# Patient Record
Sex: Male | Born: 1985 | Race: Black or African American | Hispanic: No | Marital: Single | Smoking: Never smoker
Health system: Southern US, Community
[De-identification: ages and names within clinical notes are randomized; demographics above are authoritative.]

## PROBLEM LIST (undated history)

## (undated) DIAGNOSIS — R42 Dizziness and giddiness: Secondary | ICD-10-CM

## (undated) DIAGNOSIS — I619 Nontraumatic intracerebral hemorrhage, unspecified: Secondary | ICD-10-CM

## (undated) DIAGNOSIS — J302 Other seasonal allergic rhinitis: Secondary | ICD-10-CM

## (undated) DIAGNOSIS — K8020 Calculus of gallbladder without cholecystitis without obstruction: Secondary | ICD-10-CM

## (undated) DIAGNOSIS — I639 Cerebral infarction, unspecified: Secondary | ICD-10-CM

## (undated) DIAGNOSIS — I10 Essential (primary) hypertension: Secondary | ICD-10-CM

## (undated) DIAGNOSIS — R9430 Abnormal result of cardiovascular function study, unspecified: Secondary | ICD-10-CM

## (undated) HISTORY — DX: Dizziness and giddiness: R42

## (undated) HISTORY — PX: NO PAST SURGERIES: SHX2092

## (undated) HISTORY — DX: Abnormal result of cardiovascular function study, unspecified: R94.30

## (undated) HISTORY — DX: Calculus of gallbladder without cholecystitis without obstruction: K80.20

---
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
## 2009-12-04 ENCOUNTER — Emergency Department (HOSPITAL_COMMUNITY): Admission: EM | Admit: 2009-12-04 | Discharge: 2009-12-05 | Payer: Self-pay | Admitting: Emergency Medicine

## 2010-12-20 LAB — POCT I-STAT, CHEM 8
BUN: 11 mg/dL (ref 6–23)
Calcium, Ion: 1.12 mmol/L (ref 1.12–1.32)
Chloride: 102 mEq/L (ref 96–112)
Creatinine, Ser: 1.2 mg/dL (ref 0.4–1.5)
Glucose, Bld: 137 mg/dL — ABNORMAL HIGH (ref 70–99)
HCT: 49 % (ref 39.0–52.0)
Hemoglobin: 16.7 g/dL (ref 13.0–17.0)
Potassium: 3.3 mEq/L — ABNORMAL LOW (ref 3.5–5.1)
Sodium: 142 mEq/L (ref 135–145)
TCO2: 29 mmol/L (ref 0–100)

## 2013-06-26 ENCOUNTER — Inpatient Hospital Stay (HOSPITAL_COMMUNITY)
Admission: EM | Admit: 2013-06-26 | Discharge: 2013-07-01 | DRG: 810 | Disposition: A | Discharge status: Rehabilitation Facility | Payer: BC Managed Care – PPO | Attending: Neurology | Admitting: Neurology

## 2013-06-26 ENCOUNTER — Inpatient Hospital Stay (HOSPITAL_COMMUNITY): Payer: BC Managed Care – PPO

## 2013-06-26 ENCOUNTER — Encounter (HOSPITAL_COMMUNITY): Payer: Self-pay | Admitting: Radiology

## 2013-06-26 ENCOUNTER — Emergency Department (HOSPITAL_COMMUNITY): Payer: BC Managed Care – PPO

## 2013-06-26 DIAGNOSIS — I619 Nontraumatic intracerebral hemorrhage, unspecified: Secondary | ICD-10-CM | POA: Insufficient documentation

## 2013-06-26 DIAGNOSIS — R471 Dysarthria and anarthria: Secondary | ICD-10-CM | POA: Diagnosis present

## 2013-06-26 DIAGNOSIS — I10 Essential (primary) hypertension: Secondary | ICD-10-CM

## 2013-06-26 DIAGNOSIS — R51 Headache: Secondary | ICD-10-CM | POA: Diagnosis present

## 2013-06-26 DIAGNOSIS — E860 Dehydration: Secondary | ICD-10-CM | POA: Diagnosis present

## 2013-06-26 DIAGNOSIS — G936 Cerebral edema: Secondary | ICD-10-CM

## 2013-06-26 DIAGNOSIS — I639 Cerebral infarction, unspecified: Secondary | ICD-10-CM

## 2013-06-26 DIAGNOSIS — R110 Nausea: Secondary | ICD-10-CM | POA: Diagnosis present

## 2013-06-26 DIAGNOSIS — G8190 Hemiplegia, unspecified affecting unspecified side: Secondary | ICD-10-CM | POA: Diagnosis present

## 2013-06-26 DIAGNOSIS — E876 Hypokalemia: Secondary | ICD-10-CM | POA: Diagnosis present

## 2013-06-26 DIAGNOSIS — R29810 Facial weakness: Secondary | ICD-10-CM | POA: Diagnosis present

## 2013-06-26 HISTORY — DX: Other seasonal allergic rhinitis: J30.2

## 2013-06-26 HISTORY — DX: Nontraumatic intracerebral hemorrhage, unspecified: I61.9

## 2013-06-26 HISTORY — DX: Essential (primary) hypertension: I10

## 2013-06-26 HISTORY — DX: Cerebral infarction, unspecified: I63.9

## 2013-06-26 LAB — BASIC METABOLIC PANEL
BUN: 14 mg/dL (ref 6–23)
CO2: 25 mEq/L (ref 19–32)
CO2: 22 mEq/L (ref 19–32)
Calcium: 8.7 mg/dL (ref 8.4–10.5)
Chloride: 102 mEq/L (ref 96–112)
Chloride: 103 mEq/L (ref 96–112)
Creatinine, Ser: 1.31 mg/dL (ref 0.50–1.35)
Creatinine, Ser: 1.25 mg/dL (ref 0.50–1.35)
GFR calc Af Amer: 84 mL/min — ABNORMAL LOW (ref >90)
Glucose, Bld: 129 mg/dL — ABNORMAL HIGH (ref 70–99)
Glucose, Bld: 105 mg/dL — ABNORMAL HIGH (ref 70–99)
Potassium: 3 mEq/L — ABNORMAL LOW (ref 3.5–5.1)
Sodium: 138 mEq/L (ref 135–145)

## 2013-06-26 LAB — RAPID URINE DRUG SCREEN, HOSP PERFORMED
Amphetamines: NOT DETECTED
Barbiturates: NOT DETECTED
Cocaine: NOT DETECTED
Opiates: NOT DETECTED
Tetrahydrocannabinol: NOT DETECTED

## 2013-06-26 LAB — COMPREHENSIVE METABOLIC PANEL
ALT: 21 U/L (ref 0–53)
AST: 24 U/L (ref 0–37)
Albumin: 3.6 g/dL (ref 3.5–5.2)
Alkaline Phosphatase: 64 U/L (ref 39–117)
CO2: 23 mEq/L (ref 19–32)
Calcium: 8.4 mg/dL (ref 8.4–10.5)
Chloride: 101 mEq/L (ref 96–112)
Creatinine, Ser: 1.38 mg/dL — ABNORMAL HIGH (ref 0.50–1.35)
GFR calc non Af Amer: 68 mL/min — ABNORMAL LOW (ref >90)
Glucose, Bld: 159 mg/dL — ABNORMAL HIGH (ref 70–99)
Sodium: 139 mEq/L (ref 135–145)

## 2013-06-26 LAB — DIFFERENTIAL
Basophils Absolute: 0.1 10*3/uL (ref 0.0–0.1)
Basophils Relative: 1 % (ref 0–1)
Eosinophils Relative: 2 % (ref 0–5)
Lymphocytes Relative: 46 % (ref 12–46)
Lymphs Abs: 5.5 10*3/uL — ABNORMAL HIGH (ref 0.7–4.0)
Neutro Abs: 5.2 10*3/uL (ref 1.7–7.7)
Neutrophils Relative %: 44 % (ref 43–77)

## 2013-06-26 LAB — HEMOGLOBIN A1C: Mean Plasma Glucose: 100 mg/dL (ref <117)

## 2013-06-26 LAB — APTT: aPTT: 28 seconds (ref 24–37)

## 2013-06-26 LAB — CBC
MCV: 85.8 fL (ref 78.0–100.0)
Platelets: 266 10*3/uL (ref 150–400)
RDW: 12.3 % (ref 11.5–15.5)
WBC: 12 10*3/uL — ABNORMAL HIGH (ref 4.0–10.5)

## 2013-06-26 LAB — POCT I-STAT TROPONIN I: Troponin i, poc: 0.03 ng/mL (ref 0.00–0.08)

## 2013-06-26 LAB — PROTIME-INR
INR: 0.98 (ref 0.00–1.49)
Prothrombin Time: 12.8 seconds (ref 11.6–15.2)

## 2013-06-26 MED ORDER — NICARDIPINE HCL IN NACL 20-0.86 MG/200ML-% IV SOLN
5.0000 mg/h | INTRAVENOUS | Status: DC
  Administered 2013-06-26: 5 mg/h via INTRAVENOUS
  Filled 2013-06-26 (×2): qty 200

## 2013-06-26 MED ORDER — ACETAMINOPHEN 650 MG RE SUPP: 650.0000 mg | RECTAL | Status: DC | PRN

## 2013-06-26 MED ORDER — POTASSIUM CHLORIDE 10 MEQ/100ML IV SOLN
10.0000 meq | INTRAVENOUS | Status: AC
  Administered 2013-06-26 (×2): 10 meq via INTRAVENOUS
  Filled 2013-06-26 (×2): qty 100

## 2013-06-26 MED ORDER — SENNOSIDES-DOCUSATE SODIUM 8.6-50 MG PO TABS
1.0000 | ORAL_TABLET | Freq: Two times a day (BID) | ORAL | Status: DC
  Administered 2013-06-26 – 2013-07-01 (×8): 1 via ORAL
  Filled 2013-06-26 (×12): qty 1

## 2013-06-26 MED ORDER — LABETALOL HCL 5 MG/ML IV SOLN
10.0000 mg | INTRAVENOUS | Status: DC | PRN
  Administered 2013-06-27: 20 mg via INTRAVENOUS
  Administered 2013-06-27: 40 mg via INTRAVENOUS
  Filled 2013-06-26: qty 4
  Filled 2013-06-26: qty 8

## 2013-06-26 MED ORDER — ACETAMINOPHEN 325 MG PO TABS
650.0000 mg | ORAL_TABLET | ORAL | Status: DC | PRN
  Administered 2013-06-26 – 2013-06-28 (×4): 650 mg via ORAL
  Filled 2013-06-26 (×4): qty 2

## 2013-06-26 MED ORDER — ONDANSETRON HCL 4 MG/2ML IJ SOLN
4.0000 mg | Freq: Once | INTRAMUSCULAR | Status: AC
  Administered 2013-06-26: 4 mg via INTRAVENOUS

## 2013-06-26 MED ORDER — LABETALOL HCL 5 MG/ML IV SOLN
INTRAVENOUS | Status: AC
  Administered 2013-06-26: 10 mg
  Filled 2013-06-26: qty 4

## 2013-06-26 MED ORDER — AMLODIPINE BESYLATE 5 MG PO TABS
5.0000 mg | ORAL_TABLET | Freq: Every day | ORAL | Status: DC
  Administered 2013-06-26 – 2013-06-28 (×3): 5 mg via ORAL
  Filled 2013-06-26 (×3): qty 1

## 2013-06-26 MED ORDER — NICARDIPINE HCL IN NACL 20-0.86 MG/200ML-% IV SOLN
5.0000 mg/h | INTRAVENOUS | Status: DC
  Administered 2013-06-26: 2.5 mg/h via INTRAVENOUS
  Filled 2013-06-26: qty 200

## 2013-06-26 MED ORDER — ONDANSETRON HCL 4 MG/2ML IJ SOLN
INTRAMUSCULAR | Status: AC
  Filled 2013-06-26: qty 2

## 2013-06-26 MED ORDER — PANTOPRAZOLE SODIUM 40 MG IV SOLR
40.0000 mg | Freq: Every day | INTRAVENOUS | Status: DC
  Administered 2013-06-26: 40 mg via INTRAVENOUS
  Filled 2013-06-26 (×2): qty 40

## 2013-06-26 NOTE — Progress Notes (Signed)
UR completed.  Jezebelle Ledwell, RN BSN MHA CCM Trauma/Neuro ICU Case Manager [phone]  

## 2013-06-26 NOTE — Evaluation (Deleted)
Clinical/Bedside Swallow Evaluation Patient Details  Name: Paul Reeves MRN: [id number] Date of Birth: 1986-07-18  Today's Date: 06/26/2013 Time: 0950-1009 SLP Time Calculation (min): 19 min  Past Medical History:  Past Medical History  Diagnosis Date  . Hypertension 06/26/2013    new dx   Past Surgical History: History reviewed. No pertinent past surgical history. HPI:  Mr. Delbert Darley is a 27 y.o. male presenting with left hemiparesis, dysarthria, headache, nausea. Imaging confirms a 3.2 x 1.9 cm right basal ganglia/sub insular acute intraparenchymal hematoma with local mass effect, no midline shift. 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    Assessment / Plan / Recommendation Clinical Impression  Pt demonstrates normal oral and oropharyngeal swallow function, subjectively. No overt evidence of aspiration or oral residue. Pt may intiate a regular texture diet and thin liquids. No SLP f/u for dysphagia. Will f/u tomorrow for cognitive linguistic eval tomorrow per orders dated 10/2.     Aspiration Risk  Mild    Diet Recommendation Regular;Thin liquid   Liquid Administration via: Cup;Straw Medication Administration: Whole meds with liquid Supervision: Patient able to self feed Postural Changes and/or Swallow Maneuvers: Seated upright 90 degrees    Other  Recommendations Oral Care Recommendations: Oral care BID   Follow Up Recommendations  None    Frequency and Duration        Pertinent Vitals/Pain NA    SLP Swallow Goals     Swallow Study Prior Functional Status       General HPI: Mr. Shalik Sanfilippo is a 27 y.o. male presenting with left hemiparesis, dysarthria, headache, nausea. Imaging confirms a 3.2 x 1.9 cm right basal ganglia/sub insular acute intraparenchymal hematoma with local mass effect, no midline shift. 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  Type of Study: Bedside swallow evaluation Previous Swallow  Assessment: none Diet Prior to this Study: NPO Temperature Spikes Noted: No Respiratory Status: Room air History of Recent Intubation: No Behavior/Cognition: Alert;Cooperative;Pleasant mood Oral Cavity - Dentition: Adequate natural dentition Self-Feeding Abilities: Able to feed self Patient Positioning: Upright in bed Baseline Vocal Quality: Clear Volitional Cough: Strong Volitional Swallow: Able to elicit    Oral/Motor/Sensory Function Overall Oral Motor/Sensory Function: Appears within functional limits for tasks assessed (possible slight left labial droop at rest) Labial ROM: Within Functional Limits Labial Symmetry: Abnormal symmetry left Labial Strength: Within Functional Limits Labial Sensation: Within Functional Limits Lingual ROM: Within Functional Limits Lingual Symmetry: Within Functional Limits Lingual Strength: Within Functional Limits Lingual Sensation: Within Functional Limits Facial ROM: Within Functional Limits Facial Symmetry: Within Functional Limits Facial Strength: Within Functional Limits Facial Sensation: Within Functional Limits Velum: Within Functional Limits Mandible: Within Functional Limits   Ice Chips     Thin Liquid Thin Liquid: Within functional limits Presentation: Cup;Straw    Nectar Thick Nectar Thick Liquid: Not tested   Honey Thick Honey Thick Liquid: Not tested   Puree Puree: Within functional limits   Solid   GO    Solid: Within functional limits      Harlon Ditty, MA CCC-SLP [phone]  De Jaworski, Riley Nearing 06/26/2013,10:17 AM

## 2013-06-26 NOTE — Code Documentation (Signed)
27 yo wm brought in via [facility] for sudden onset HA, N/V, LT side weakness.  Per pt he got off work and came home.  He went into the bathroom and developed a HA, nausea, & Lt weakness.  His mother called EMS.  On arrival to [facility] pt was flaccid with sensory deficit on Lt side & slight dysarthria.  Cpde stroke called 0127, pt arrival 0138, LKW 0045, EDP exam 0134, stroke team arrival 0131, neurologist arrival 36, pt arrival in CT 0136, phlebotomist arrival 0137, ICU bed requested 0144.  NIH 11.  Pt to be admitted by stroke MD

## 2013-06-26 NOTE — ED Notes (Signed)
Patient arrival in CT

## 2013-06-26 NOTE — ED Notes (Signed)
Stroke team arrival

## 2013-06-26 NOTE — ED Notes (Signed)
ICU bed requested

## 2013-06-26 NOTE — Progress Notes (Addendum)
Stroke Team Progress Note  HISTORY Paul Reeves is an 27 y.o. male without known past medical history, brought to [facility] ED as a code stroke by EMS after being found lying in the bathroom by family.   He was last seen normal at 0045 today. Stated that he collapsed across the bathroom, remained conscious all the time, but couldn't get up from the floor and started calling for help. His mother said that he was struggling trying to move the left side floor.  When EMS arrived to the scene he was alert and awake, unable to move the left side, some speech impairment, and SBP 250.  Initial NIHSS 12.   CT brain reveled a 3.2 x 1.9 cm right basal ganglia/sub insular acute intraparenchymal hematoma with local mass effect, no midline shift, no intraventricular extension.   On arrival he complained of HA and nausea, but denies vertigo, double vision, difficulty swallowing, confusion, or visual disturbances.   Received 20 mg IV labetalol while awaiting for cardene drip   Date last known well: 10/1//14  Time last known well: 12:45am  tPA Given: no, ICH  NIHSS: 12  MRS: 3    Patient was not a TPA candidate secondary to hemorrhage. He was admitted to the neuro ICU for further evaluation and treatment.  SUBJECTIVE Overall he feels his condition is rapidly improving. Denies history of hypertension. Denies drug use.  OBJECTIVE Most recent Vital Signs: Filed Vitals:   06/26/13 0445 06/26/13 0500 06/26/13 0600 06/26/13 0700  BP: 164/91 160/98 150/95 148/95  Pulse: 98 98 101 106  Temp:      TempSrc:      Resp: 18 16 20 18   Height:      Weight:      SpO2: 97% 96% 96% 95%   CBG (last 3)   Recent Labs  06/26/13 0153  GLUCAP 162*    IV Fluid Intake:   . niCARDipine 2.5 mg/hr (06/26/13 0300)    MEDICATIONS  . pantoprazole (PROTONIX) IV  40 mg Intravenous QHS  . senna-docusate  1 tablet Oral BID   PRN:  acetaminophen, acetaminophen, labetalol  Diet:  NPO   Activity:  Bedrest DVT  Prophylaxis:  SCD  CLINICALLY SIGNIFICANT STUDIES Basic Metabolic Panel:  Recent Labs Lab 06/26/13 0140 06/26/13 0540  NA 139 138  K 2.3* 3.0*  CL 101 102  CO2 23 25  GLUCOSE 159* 129*  BUN 16 14  CREATININE 1.38* 1.31  CALCIUM 8.4 8.7   Liver Function Tests:  Recent Labs Lab 06/26/13 0140  AST 24  ALT 21  ALKPHOS 64  BILITOT 0.5  PROT 7.4  ALBUMIN 3.6   CBC:  Recent Labs Lab 06/26/13 0140  WBC 12.0*  NEUTROABS 5.2  HGB 16.3  HCT 44.1  MCV 85.8  PLT 266   Coagulation:  Recent Labs Lab 06/26/13 0140  LABPROT 12.8  INR 0.98   Cardiac Enzymes:  Recent Labs Lab 06/26/13 0140  TROPONINI <0.30   Urinalysis: No results found for this basename: COLORURINE, APPERANCEUR, LABSPEC, PHURINE, GLUCOSEU, HGBUR, BILIRUBINUR, KETONESUR, PROTEINUR, UROBILINOGEN, NITRITE, LEUKOCYTESUR,  in the last 168 hours Lipid Panel No results found for this basename: chol, trig, hdl, cholhdl, vldl, ldlcalc   HgbA1C  No results found for this basename: HGBA1C    Urine Drug Screen:   No results found for this basename: labopia, cocainscrnur, labbenz, amphetmu, thcu, labbarb    Alcohol Level: No results found for this basename: ETH,  in the last 168 hours  Ct Head (  brain) Wo Contrast 06/26/2013   3.2 x 1.9 cm right basal ganglia/sub insular acute intraparenchymal hematoma with local mass effect, no midline shift.    MRI of the brain    MRA of the brain    2D Echocardiogram    Carotid Doppler    CXR    EKG    Therapy Recommendations   Physical Exam   Mental Status:  Alert, awake, oriented x 4, thought content appropriate. Mild dysarthria without evidence of aphasia. Able to follow 3 step commands without difficulty.  Cranial Nerves:  II: Discs flat bilaterally; Visual fields grossly normal, pupils equal, round, reactive to light and accommodation  III,IV, VI: ptosis not present, extra-ocular motions intact bilaterally  V:facial light touch sensation normal  bilaterally  VII: mild  Lower left face weakness.  VIII: hearing normal bilaterally  IX,X: gag reflex present  XI: bilateral shoulder shrug  XII: midline tongue extension  Motor:  Significant for mild left hemiplegia 4/5 strength with drift Tone and bulk:normal bulk throughout, decreased tone left side; no atrophy noted  Sensory: Pinprick and light touch impaired in the left side.  Deep Tendon Reflexes:  Right: Upper Extremity Left: Upper extremity  biceps (C-5 to C-6) 2/4 biceps (C-5 to C-6) 2/4  tricep (C7) 2/4 triceps (C7) 2/4  Brachioradialis (C6) 2/4 Brachioradialis (C6) 2/4  Lower Extremity Lower Extremity  quadriceps (L-2 to L-4) 2/4 quadriceps (L-2 to L-4) 2/4  Achilles (S1) 2/4 Achilles (S1) 2/4  Plantars:  Right: downgoing Left: downgoing  Cerebellar:  normal finger-to-nose, normal heel-to-shin test in the left. Couldn't be tested in the left due to hemiplegia.  Gait:  Unable to test.  CV: pulses palpable throughout    ASSESSMENT Mr. Christoper Bushey is a 27 y.o. male presenting with left hemiparesis, dysarthria, headache, nausea. Imaging confirms a 3.2 x 1.9 cm right basal ganglia/sub insular acute intraparenchymal hematoma with local mass effect, no midline shift. 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  On no antithrombotics prior to admission. Now on no antithrombotics for secondary stroke prevention. Patient with resultant left hemiparesis, headache. Work up underway.   Malignant hypertension  ICH  Hypokalemia  Hospital day # 0  TREATMENT/PLAN  Continue no antithrombotics for secondary stroke prevention due to Hemorrhage  Strcit control of HT -cardene drip and start oral BP meds if able to swallow.  Replace potassium, recheck potassium levels ordered  IV cardene. Once passes swallow test, start oral medications  Therapy evaluations  Check hgb a1c, lipids, renal artery ultrasound, 2D echo.  Urine drug screen  Gwendolyn Lima. Manson Passey, [facility],  MBA, MHA [facility] Pager: [phone] 06/26/2013 8:40 AM This patient is critically ill and at significant risk of neurological worsening, death and care requires constant monitoring of vital signs, hemodynamics,respiratory and cardiac monitoring,review of multiple databases, neurological assessment, discussion with family, other specialists and medical decision making of high complexity. I spent 30 minutes of neurocritical care time  in the care of  this patient. I have personally obtained a history, examined the patient, evaluated imaging results, and formulated the assessment and plan of care. I agree with the above. Delia Heady, MD

## 2013-06-26 NOTE — Evaluation (Signed)
Clinical/Bedside Swallow Evaluation Patient Details  Name: Paul Reeves MRN: [id number] Date of Birth: 1986-07-25  Today's Date: 06/26/2013 Time: 0950-1009 SLP Time Calculation (min): 19 min  Past Medical History:  Past Medical History  Diagnosis Date  . Hypertension 06/26/2013    new dx   Past Surgical History: History reviewed. No pertinent past surgical history. HPI:  [name] is a 27 y.o. male presenting with left hemiparesis, dysarthria, headache, nausea. Imaging confirms a 3.2 x 1.9 cm right basal ganglia/sub insular acute intraparenchymal hematoma with local mass effect, no midline shift. 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    Assessment / Plan / Recommendation Clinical Impression  Pt demonstrates normal oral and oropharyngela swallow function, subjectively. No overt evidence of aspiration or oral residue. Pt may intiate a regular texture diet and thin liquids. No SLP f/u for dysphagia. Will f/u tomorrow for cognitive linguistic eval tomorrow per orders dated 10/2.     Aspiration Risk  Mild    Diet Recommendation Regular;Thin liquid   Liquid Administration via: Cup;Straw Medication Administration: Whole meds with liquid Supervision: Patient able to self feed Postural Changes and/or Swallow Maneuvers: Seated upright 90 degrees    Other  Recommendations Oral Care Recommendations: Oral care BID   Follow Up Recommendations  None    Frequency and Duration        Pertinent Vitals/Pain NA    SLP Swallow Goals     Swallow Study Prior Functional Status       General HPI: [name] is a 27 y.o. male presenting with left hemiparesis, dysarthria, headache, nausea. Imaging confirms a 3.2 x 1.9 cm right basal ganglia/sub insular acute intraparenchymal hematoma with local mass effect, no midline shift. 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  Type of Study: Bedside swallow evaluation Previous Swallow  Assessment: none Diet Prior to this Study: NPO Temperature Spikes Noted: No Respiratory Status: Room air History of Recent Intubation: No Behavior/Cognition: Alert;Cooperative;Pleasant mood Oral Cavity - Dentition: Adequate natural dentition Self-Feeding Abilities: Able to feed self Patient Positioning: Upright in bed Baseline Vocal Quality: Clear Volitional Cough: Strong Volitional Swallow: Able to elicit    Oral/Motor/Sensory Function Overall Oral Motor/Sensory Function: Appears within functional limits for tasks assessed (possible slight left labial droop at rest) Labial ROM: Within Functional Limits Labial Symmetry: Abnormal symmetry left Labial Strength: Within Functional Limits Labial Sensation: Within Functional Limits Lingual ROM: Within Functional Limits Lingual Symmetry: Within Functional Limits Lingual Strength: Within Functional Limits Lingual Sensation: Within Functional Limits Facial ROM: Within Functional Limits Facial Symmetry: Within Functional Limits Facial Strength: Within Functional Limits Facial Sensation: Within Functional Limits Velum: Within Functional Limits Mandible: Within Functional Limits   Ice Chips     Thin Liquid Thin Liquid: Within functional limits Presentation: Cup;Straw    Nectar Thick Nectar Thick Liquid: Not tested   Honey Thick Honey Thick Liquid: Not tested   Puree Puree: Within functional limits   Solid   GO    Solid: Within functional limits      Harlon Ditty, MA CCC-SLP [phone]   Paul Reeves, Riley Nearing 06/26/2013,10:15 AM

## 2013-06-26 NOTE — ED Notes (Signed)
Code Stroke Called

## 2013-06-26 NOTE — ED Notes (Signed)
Neurologist arrival. 

## 2013-06-26 NOTE — ED Notes (Signed)
EDP exam/ Cleared for CT

## 2013-06-26 NOTE — Evaluation (Addendum)
Physical Therapy Evaluation Patient Details Name: Paul Reeves MRN: [id number] DOB: 05-May-1986 Today's Date: 06/26/2013 Time: 6295-2841 PT Time Calculation (min): 33 min  Spoke with Cathlyn Parsons, PA-C for neurology, she okayed OOB mobility/PT eval for 06/26/2013.  PT Assessment / Plan / Recommendation History of Present Illness  27 y.o. male without known past medical history, brought to [facility] ED as a code stroke by EMS after being found lying in the bathroom by family. CT brain reveled a 3.2 x 1.9 cm right basal ganglia/sub insular acute intraparenchymal hematoma with local mass effect, no midline shift, no intraventricular extension.   Clinical Impression  Pt OOB mobility limited by HA and nausea this date. Pt with L sided hemiplegia and 100% light touch impairment of L UE/LE. Pt was indep. PTA and is only 27 yo. Pt an excellent candidate for CIR to achieve maximal functional recovery to transition home with assist of family.     PT Assessment  Patient needs continued PT services    Follow Up Recommendations  CIR    Does the patient have the potential to tolerate intense rehabilitation      Barriers to Discharge        Equipment Recommendations   (TBD)    Recommendations for Other Services Rehab consult   Frequency Min 4X/week    Precautions / Restrictions Precautions Precautions: Fall Precaution Comments: pt with nausea/HA with mvmt   Pertinent Vitals/Pain C/o nausea/dizziness      Mobility  Bed Mobility Bed Mobility: Rolling Left;Supine to Sit;Sit to Supine Rolling Right: 2: Max assist;With rail (v/c's for sequencing) Rolling Left: 3: Mod assist;With rail Supine to Sit: 2: Max assist;With rails;HOB elevated Sit to Supine: 2: Max assist;With rail;HOB flat Details for Bed Mobility Assistance: assist for trunk elevation, assist for L LE management off EOB Pt assist with rolling L/R due to urinary incontinence. Pt able to use R UE to perform  pericare. Transfers Transfers: Sit to Stand Sit to Stand: 1: +1 Total assist;With upper extremity assist;From bed Details for Transfer Assistance: unable to reach full upright position due to patient feeling like he was going to vomit and increased dizziness. Pt returned to supine. Ambulation/Gait Ambulation/Gait Assistance: Not tested (comment)    Exercises     PT Diagnosis: Difficulty walking;Hemiplegia non-dominant side  PT Problem List: Decreased strength;Decreased activity tolerance;Decreased balance;Decreased mobility;Decreased coordination;Cardiopulmonary status limiting activity;Impaired sensation PT Treatment Interventions: DME instruction;Gait training;Stair training;Functional mobility training;Therapeutic activities;Therapeutic exercise;Balance training;Neuromuscular re-education     PT Goals(Current goals can be found in the care plan section) Acute Rehab PT Goals Patient Stated Goal: home PT Goal Formulation: With patient Time For Goal Achievement: 07/10/13 Potential to Achieve Goals: Good  Visit Information  Last PT Received On: 06/26/13 Assistance Needed: +2 Reason Eval/Treat Not Completed: Other (comment) (Pt on bedrest per chart.  Nursing calling MD in am to get or) History of Present Illness: 27 y.o. male without known past medical history, brought to [facility] ED as a code stroke by EMS after being found lying in the bathroom by family. CT brain reveled a 3.2 x 1.9 cm right basal ganglia/sub insular acute intraparenchymal hematoma with local mass effect, no midline shift, no intraventricular extension.        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 Communication: No difficulties Dominant Hand: Right    Cognition  Cognition Arousal/Alertness: Awake/alert Behavior During Therapy: WFL for tasks assessed/performed Overall Cognitive Status: Within Functional Limits for tasks assessed    Extremity/Trunk Assessment Upper Extremity Assessment Upper Extremity Assessment: LUE deficits/detail LUE Deficits / Details: grossly 2+/5 LUE Sensation: decreased light touch (can feel deep pressure, face normal) LUE Coordination: decreased fine motor;decreased gross motor Lower Extremity Assessment Lower Extremity Assessment: LLE deficits/detail LLE Deficits / Details: grossly 2+/5 LLE Sensation: decreased light touch (can feel deep pressure) Cervical / Trunk Assessment Cervical / Trunk Assessment: Normal   Balance Balance Balance Assessed: Yes Static Sitting Balance Static Sitting - Balance Support: Bilateral upper extremity supported;Feet supported Static Sitting - Level of Assistance: 4: Min assist Static Sitting - Comment/# of Minutes: c/o nausea/dizziness. v/c's to maintain eye opening.pt reports "it feels better when I hang my head down and close my eyes."  End of Session PT - End of Session Equipment Utilized During Treatment: Gait belt Activity Tolerance:  (limited by HA and nausea) Patient left: in bed;with call bell/phone within reach Nurse Communication: Mobility status (nausea/HA)  GP     Marcene Brawn 06/26/2013, 4:08 PM Lewis Shock, PT, DPT Pager #: [phone] Office #: [phone]

## 2013-06-26 NOTE — ED Notes (Signed)
Phlebotomist arrival 

## 2013-06-26 NOTE — Progress Notes (Signed)
CRITICAL VALUE ALERT  Critical value received:  k-2.3  Date of notification:  10/1  Time of notification:  0330  Critical value read back:yes Nurse who received alert:  Huel Cote, RN   MD notified (1st page):  Camilo   Time of first page:  0330  MD notified (2nd page):  Time of second page:  Responding MD: Leroy Kennedy   Time MD responded:  0330

## 2013-06-26 NOTE — Progress Notes (Signed)
PT Cancellation Note  Patient Details Name: Paul Reeves MRN: [id number] DOB: [birth year]   Cancelled Treatment:    Reason Eval/Treat Not Completed: Other (comment) (Pt on bedrest per chart.  Nursing calling MD in am to get orders.)  When orders received, will attempt as able.     INGOLD,Cartel Mauss 06/26/2013, 1:19 PM  [facility] Acute Rehabilitation [phone] (pager)

## 2013-06-26 NOTE — Consult Note (Signed)
Referring Physician: ED    Chief Complaint: CODE STROKE/ LEFT HEMIPARESIS, DYSARTHRIA.  HPI:                                                                                                                                         Paul Reeves is an 27 y.o. male without known past medical history, brought to [facility] ED as a code stroke by EMS after being found lying in the bathroom by family. He was last seen normal at 0045 today. Stated that he collapsed across the bathroom, remained conscious all the time, but couldn't get up from the floor and started calling for help. His mother said that he was struggling trying to move the left side floor. When EMS arrived to the scene he was alert and awake, unable to move the left side, some speech impairment, and SBP 250. Initial NIHSS 12. CT brain reveled a 3.2 x 1.9 cm right basal ganglia/sub insular acute intraparenchymal hematoma with local mass effect, no midline shift, no intraventricular extension. At this moment complains of HA and nausea, but denies vertigo, double vision, difficulty swallowing, confusion, or visual disturbances.  Received 20 mg IV labetalol while awaiting for cardene drip  Date last known well: 10/1//14 Time last known well: 12:45am tPA Given: no, ICH NIHSS: 12 MRS: 3   History reviewed. No pertinent past medical history.  No past surgical history on file.  No family history on file. Social History:  has no tobacco, alcohol, and drug history on file.  Allergies: Not on File  Medications:                                                                                                                           I have reviewed the patient's current medications.  ROS:  History obtained from the patient and family.  General ROS: negative for - chills, fatigue, fever, night sweats, weight  gain or weight loss Psychological ROS: negative for - behavioral disorder, hallucinations, memory difficulties, mood swings or suicidal ideation Ophthalmic ROS: negative for - blurry vision, double vision, eye pain or loss of vision ENT ROS: negative for - epistaxis, nasal discharge, oral lesions, sore throat, tinnitus or vertigo Allergy and Immunology ROS: negative for - hives or itchy/watery eyes Hematological and Lymphatic ROS: negative for - bleeding problems, bruising or swollen lymph nodes Endocrine ROS: negative for - galactorrhea, hair pattern changes, polydipsia/polyuria or temperature intolerance Respiratory ROS: negative for - cough, hemoptysis, shortness of breath or wheezing Cardiovascular ROS: negative for - chest pain, dyspnea on exertion, edema or irregular heartbeat Gastrointestinal ROS: negative for - abdominal pain, diarrhea, hematemesis, nausea/vomiting or stool incontinence Genito-Urinary ROS: negative for - dysuria, hematuria, incontinence or urinary frequency/urgency Musculoskeletal ROS: negative for - joint swelling Neurological ROS: as noted in HPI Dermatological ROS: negative for rash and skin lesion changes    Physical exam: pleasant male in no apparent distress. BP 217/80 P 82 R 17, afebrile Head: normocephalic. Neck: supple, no bruits, no JVD. Cardiac: no murmurs. Lungs: clear. Abdomen: soft, no tender, no mass. Extremities: no edema.  Neurologic Examination:                                                                                                      Mental Status: Alert, awake, oriented x 4, thought content appropriate.  Mild dysarthria without evidence of aphasia.  Able to follow 3 step commands without difficulty. Cranial Nerves: II: Discs flat bilaterally; Visual fields grossly normal, pupils equal, round, reactive to light and accommodation III,IV, VI: ptosis not present, extra-ocular motions intact bilaterally V:facial light touch sensation  normal bilaterally VII: mild left face weakness. VIII: hearing normal bilaterally IX,X: gag reflex present XI: bilateral shoulder shrug XII: midline tongue extension Motor: Significant for left hemiplegia Tone and bulk:normal bulk throughout, decreased tone left side; no atrophy noted Sensory: Pinprick and light touch impaired in the left side. Deep Tendon Reflexes:  Right: Upper Extremity   Left: Upper extremity   biceps (C-5 to C-6) 2/4   biceps (C-5 to C-6) 2/4 tricep (C7) 2/4    triceps (C7) 2/4 Brachioradialis (C6) 2/4  Brachioradialis (C6) 2/4  Lower Extremity Lower Extremity  quadriceps (L-2 to L-4) 2/4   quadriceps (L-2 to L-4) 2/4 Achilles (S1) 2/4   Achilles (S1) 2/4  Plantars: Right: downgoing   Left: downgoing Cerebellar: normal finger-to-nose,  normal heel-to-shin test in the left. Couldn't be tested in the left due to hemiplegia.  Gait: Unable to test. CV: pulses palpable throughout      No results found for this or any previous visit (from the past 48 hour(s)). No results found.    Assessment: 27 y.o. male with hypertensive right BG ICH. Preserved mental status at this moment but dense left hemiplegia, mild dysarthria, mild left face weakness. BP control with Cardene, goal SBP 160. Follow up CT brain in 24 hours or  sooner if evidence of neurological decline. PT/speech therapy consults.  Will follow up closely tonight. Stroke team to resume care in the morning. Family updated.  Wyatt Portela ,MD [facility] [phone]  06/26/2013, 1:52 AM

## 2013-06-26 NOTE — ED Provider Notes (Addendum)
CSN: [id number]     Arrival date & time 06/26/13  0138 History   First MD Initiated Contact with Patient 06/26/13 0148     Chief Complaint  Patient presents with  . Code Stroke   (Consider location/radiation/quality/duration/timing/severity/associated sxs/prior Treatment) HPI Comments: Patient is a 27 year old male with no significant medical history. He is brought to the emergency department for evaluation of left-sided weakness. This evening he went to the bathroom and became extremely weak and could not stand. He fell to the floor and EMS was called by his mother. When they arrived his blood pressure was markedly elevated and he was having difficulty moving his left arm and leg. He denies to me there is any injury or trauma. He denies any fevers or chills.  Patient is a 27 y.o. male presenting with weakness. The history is provided by the patient.  Weakness This is a new problem. The current episode started less than 1 hour ago. The problem occurs constantly. The problem has not changed since onset.Associated symptoms include headaches. Pertinent negatives include no chest pain. Nothing aggravates the symptoms. Nothing relieves the symptoms. He has tried nothing for the symptoms. The treatment provided no relief.    History reviewed. No pertinent past medical history. History reviewed. No pertinent past surgical history. History reviewed. No pertinent family history. History  Substance Use Topics  . Smoking status: Not on file  . Smokeless tobacco: Not on file  . Alcohol Use: Not on file    Review of Systems  Cardiovascular: Negative for chest pain.  Neurological: Positive for weakness and headaches.  All other systems reviewed and are negative.    Allergies  Peanuts and Naproxen  Home Medications  No current outpatient prescriptions on file. BP 202/123  Pulse 88  Temp(Src) 97.2 F (36.2 C) (Oral)  Resp 19  SpO2 100% Physical Exam  Nursing note and vitals  reviewed. Constitutional: He is oriented to person, place, and time. He appears well-developed and well-nourished. No distress.  HENT:  Head: Normocephalic and atraumatic.  Mouth/Throat: Oropharynx is clear and moist.  Eyes: EOM are normal. Pupils are equal, round, and reactive to light.  Neck: Normal range of motion. Neck supple.  Cardiovascular: Normal rate, regular rhythm and normal heart sounds.   No murmur heard. Pulmonary/Chest: Effort normal and breath sounds normal. No respiratory distress. He has no wheezes.  Abdominal: Soft. Bowel sounds are normal. He exhibits no distension. There is no tenderness.  Musculoskeletal: Normal range of motion. He exhibits no edema.  Neurological: He is alert and oriented to person, place, and time. No cranial nerve deficit. He exhibits abnormal muscle tone. Coordination normal.  There is significant strength deficit noted in the left arm and left leg. Strength is 2/5 in the left arm and left leg 5 out of 5 in the right arm and right leg. He otherwise follows commands appropriately.  Skin: Skin is warm and dry. He is not diaphoretic.    ED Course  Procedures (including critical care time) Labs Review Labs Reviewed  GLUCOSE, CAPILLARY - Abnormal; Notable for the following:    Glucose-Capillary 162 (*)    All other components within normal limits  PROTIME-INR  APTT  CBC  DIFFERENTIAL  COMPREHENSIVE METABOLIC PANEL  TROPONIN I  POCT I-STAT TROPONIN I   Imaging Review Ct Head (brain) Wo Contrast  06/26/2013   *RADIOLOGY REPORT*  Clinical Data: Code stroke.  CT HEAD WITHOUT CONTRAST  Technique:  Contiguous axial images were obtained from the base of  the skull through the vertex without contrast.  Comparison: None available at time of study interpretation.  Findings: 3.2 x 1.9 cm hyperdense intraparenchymal hematoma within the right posterior basal ganglia/sub insular white matter.  Local mass effect, and mild surrounding low density vasogenic.  The  ventricles and sulci are overall normal for patient's age. No acute large vascular territory infarct.  No abnormal extra-axial fluid collections.  Basal cisterns are patent.  Impacted uninterrupted left maxillary molar.  The paranasal sinuses and mastoid air cells appear well aerated.  No skull fracture.  Included ocular globes and orbital contents are not suspicious.  IMPRESSION: 3.2 x 1.9 cm right basal ganglia/sub insular acute intraparenchymal hematoma with local mass effect, no midline shift.  Critical Value/emergent results were called by telephone at the time of interpretation on June 26, 2013 at 0150 hours to Dr. Judd Lien, who verbally acknowledged these results.   Original Report Authenticated By: Awilda Metro    Date: 06/26/2013  Rate: 83  Rhythm: normal sinus rhythm  QRS Axis: normal  Intervals: normal  ST/T Wave abnormalities: nonspecific T wave changes  Conduction Disutrbances:none  Narrative Interpretation:   Old EKG Reviewed: none available    MDM  No diagnosis found. Patient was brought to the emergency department as a code stroke. He was evaluated immediately upon arrival and was taken to CT for a scan of his head. Unfortunately this revealed a 3.2 x 1.9 cm intraparenchymal hemorrhage. Patient was seen by Dr. Leroy Kennedy from neurology and they will admit the patient. He was started on a Cardipene drip for normalization of his blood pressure.  CRITICAL CARE Performed by: Geoffery Lyons Total critical care time: 30 minutes Critical care time was exclusive of separately billable procedures and treating other patients. Critical care was necessary to treat or prevent imminent or life-threatening deterioration. Critical care was time spent personally by me on the following activities: development of treatment plan with patient and/or surrogate as well as nursing, discussions with consultants, evaluation of patient's response to treatment, examination of patient, obtaining history from  patient or surrogate, ordering and performing treatments and interventions, ordering and review of laboratory studies, ordering and review of radiographic studies, pulse oximetry and re-evaluation of patient's condition.     Geoffery Lyons, MD 06/26/13 1610  Geoffery Lyons, MD 06/26/13 [phone]

## 2013-06-27 ENCOUNTER — Inpatient Hospital Stay (HOSPITAL_COMMUNITY): Payer: BC Managed Care – PPO

## 2013-06-27 ENCOUNTER — Encounter (HOSPITAL_COMMUNITY): Payer: Self-pay | Admitting: Physical Medicine and Rehabilitation

## 2013-06-27 DIAGNOSIS — I359 Nonrheumatic aortic valve disorder, unspecified: Secondary | ICD-10-CM

## 2013-06-27 DIAGNOSIS — I619 Nontraumatic intracerebral hemorrhage, unspecified: Secondary | ICD-10-CM

## 2013-06-27 LAB — BASIC METABOLIC PANEL
BUN: 13 mg/dL (ref 6–23)
BUN: 12 mg/dL (ref 6–23)
CO2: 25 mEq/L (ref 19–32)
Calcium: 8.7 mg/dL (ref 8.4–10.5)
Calcium: 8.6 mg/dL (ref 8.4–10.5)
Chloride: 107 mEq/L (ref 96–112)
Creatinine, Ser: 1.5 mg/dL — ABNORMAL HIGH (ref 0.50–1.35)
Creatinine, Ser: 1.35 mg/dL (ref 0.50–1.35)
GFR calc Af Amer: 81 mL/min — ABNORMAL LOW (ref >90)
GFR calc non Af Amer: 70 mL/min — ABNORMAL LOW (ref >90)
Glucose, Bld: 80 mg/dL (ref 70–99)
Sodium: 143 mEq/L (ref 135–145)

## 2013-06-27 LAB — LIPID PANEL
Cholesterol: 132 mg/dL (ref 0–200)
LDL Cholesterol: 76 mg/dL (ref 0–99)
Total CHOL/HDL Ratio: 3.1 RATIO
Triglycerides: 68 mg/dL (ref <150)
VLDL: 14 mg/dL (ref 0–40)

## 2013-06-27 LAB — MAGNESIUM: Magnesium: 1.9 mg/dL (ref 1.5–2.5)

## 2013-06-27 MED ORDER — PANTOPRAZOLE SODIUM 40 MG PO TBEC
40.0000 mg | DELAYED_RELEASE_TABLET | Freq: Every day | ORAL | Status: DC
  Administered 2013-06-27 – 2013-07-01 (×5): 40 mg via ORAL
  Filled 2013-06-27 (×5): qty 1

## 2013-06-27 MED ORDER — PANTOPRAZOLE SODIUM 40 MG PO TBEC: 40.0000 mg | DELAYED_RELEASE_TABLET | Freq: Every day | ORAL | Status: DC

## 2013-06-27 MED ORDER — POTASSIUM CHLORIDE CRYS ER 20 MEQ PO TBCR
20.0000 meq | EXTENDED_RELEASE_TABLET | Freq: Two times a day (BID) | ORAL | Status: AC
  Administered 2013-06-27 – 2013-06-28 (×4): 20 meq via ORAL
  Filled 2013-06-27 (×5): qty 1

## 2013-06-27 MED ORDER — SODIUM CHLORIDE 0.9 % IV SOLN
INTRAVENOUS | Status: DC
  Administered 2013-06-27 – 2013-06-29 (×2): via INTRAVENOUS

## 2013-06-27 MED ORDER — LABETALOL HCL 5 MG/ML IV SOLN
20.0000 mg | INTRAVENOUS | Status: DC | PRN
  Administered 2013-06-27 (×2): 20 mg via INTRAVENOUS
  Filled 2013-06-27 (×2): qty 4

## 2013-06-27 MED ORDER — LABETALOL HCL 5 MG/ML IV SOLN
10.0000 mg | INTRAVENOUS | Status: DC | PRN
  Administered 2013-06-27: 10 mg via INTRAVENOUS
  Filled 2013-06-27: qty 4

## 2013-06-27 MED ORDER — ONDANSETRON HCL 4 MG/2ML IJ SOLN
4.0000 mg | Freq: Four times a day (QID) | INTRAMUSCULAR | Status: DC | PRN
Start: 2013-06-27 — End: 2013-07-01
  Administered 2013-06-27 – 2013-06-28 (×3): 4 mg via INTRAVENOUS
  Filled 2013-06-27 (×4): qty 2

## 2013-06-27 NOTE — Progress Notes (Signed)
Called by bedside RN regarding elevated SBP following episode of chest pain. MD notified, orders received and carried out by bedside RN. Will continue to monitor, advised bedside RN to call if further assistance needed.  Follow up at 2245, patient's chest pain relieved, EKG at baseline, SBPs 180s, labs obtained. Will continue to monitor

## 2013-06-27 NOTE — Progress Notes (Signed)
Occupational Therapy Evaluation Patient Details Name: Paul Reeves MRN: [id number] DOB: 1986/07/18 Today's Date: 06/27/2013 Time: 4098-1191 OT Time Calculation (min): 20 min  OT Assessment / Plan / Recommendation History of present illness 27 y.o. male without known past medical history, brought to [facility] ED as a code stroke by EMS after being found lying in the bathroom by family. CT brain reveled a 3.2 x 1.9 cm right basal ganglia/sub insular acute intraparenchymal hematoma with local mass effect, no midline shift, no intraventricular extension.    Clinical Impression   PTA, pt lived at home with family and was independent with ADL and mobility. Pt presents with significant deficits with ADL and mobility and would benefit from CIR to reach S/set up level to return home with family with 24/7 S. Pt very motivated to regain functional independence. Will follow acutely.Will benefit from beginning gaze stabilization exercises.    OT Assessment  Patient needs continued OT Services    Follow Up Recommendations  CIR    Barriers to Discharge      Equipment Recommendations  3 in 1 bedside comode;Tub/shower bench    Recommendations for Other Services Rehab consult  Frequency  Min 3X/week    Precautions / Restrictions Precautions Precautions: Fall Precaution Comments: pt with nausea/HA with mvmt   Pertinent Vitals/Pain no apparent distress     ADL  Grooming: Moderate assistance Where Assessed - Grooming: Supported sitting Upper Body Bathing: Moderate assistance Where Assessed - Upper Body Bathing: Supported sitting Lower Body Bathing: Maximal assistance Where Assessed - Lower Body Bathing: Lean right and/or left Upper Body Dressing: Maximal assistance Where Assessed - Upper Body Dressing: Supported sitting Lower Body Dressing: Maximal assistance Where Assessed - Lower Body Dressing: Lean right and/or left;Supported sitting Toilet Transfer: +1 Total assistance Toileting - Clothing  Manipulation and Hygiene: Maximal assistance Equipment Used: Gait belt Transfers/Ambulation Related to ADLs: Total A at this time. began feeling nauseated    OT Diagnosis: Generalized weakness;Cognitive deficits;Disturbance of vision;Hemiplegia non-dominant side  OT Problem List: Decreased strength;Decreased range of motion;Decreased activity tolerance;Impaired balance (sitting and/or standing);Impaired vision/perception;Decreased coordination;Decreased cognition;Decreased safety awareness;Decreased knowledge of use of DME or AE;Decreased knowledge of precautions;Cardiopulmonary status limiting activity;Impaired sensation;Impaired tone;Obesity;Impaired UE functional use OT Treatment Interventions: Self-care/ADL training;Therapeutic exercise;Neuromuscular education;Energy conservation;DME and/or AE instruction;Therapeutic activities;Cognitive remediation/compensation;Visual/perceptual remediation/compensation;Patient/family education;Balance training   OT Goals(Current goals can be found in the care plan section) Acute Rehab OT Goals Patient Stated Goal: home OT Goal Formulation: With patient Time For Goal Achievement: 07/11/13 Potential to Achieve Goals: Good  Visit Information  Last OT Received On: 06/27/13 Assistance Needed: +2 History of Present Illness: 27 y.o. male without known past medical history, brought to [facility] ED as a code stroke by EMS after being found lying in the bathroom by family. CT brain reveled a 3.2 x 1.9 cm right basal ganglia/sub insular acute intraparenchymal hematoma with local mass effect, no midline shift, no intraventricular extension.        Prior Functioning     Home Living Family/patient expects to be discharged to:: Private residence Living Arrangements: Parent Available Help at Discharge: Family;Available 24 hours/day Type of Home: House Home Access: Stairs to enter [employer] of Steps: 1 Home Layout: One level Home Equipment: None Prior  Function Level of Independence: Independent Comments: pt working at call center Communication Communication: No difficulties Dominant Hand: Right         Vision/Perception Vision - History Baseline Vision: No visual deficits Patient Visual Report: No change from baseline Vision - Assessment Eye  Alignment: Within Functional Limits Vision Assessment: Vision not tested Additional Comments: will further assess Perception Perception: Impaired (L inattention) Praxis Praxis: Intact   Cognition  Cognition Arousal/Alertness: Awake/alert Behavior During Therapy: WFL for tasks assessed/performed Overall Cognitive Status: Impaired/Different from baseline Area of Impairment: Attention;Awareness;Problem solving Current Attention Level: Sustained Awareness: Emergent Problem Solving: Slow processing;Requires verbal cues General Comments: decreased postural control when distracted    Extremity/Trunk Assessment Upper Extremity Assessment Upper Extremity Assessment: LUE deficits/detail LUE Deficits / Details: Brunstrom stage IV arm and hand - semi voluntary finger extension, movement deviating from synergy (greater movement distally but not a funcitonal grasp at this) LUE Sensation: decreased light touch;decreased proprioception (can feel deep pressure, face normal) LUE Coordination: decreased fine motor;decreased gross motor Lower Extremity Assessment Lower Extremity Assessment: LLE deficits/detail LLE Deficits / Details: grossly 2+/5 LLE:  (motor impersistence) LLE Sensation: decreased light touch (can feel deep pressure) Cervical / Trunk Assessment Cervical / Trunk Assessment: Other exceptions (L bias. Unable to maintain midline posture sitting EOB. ) Cervical / Trunk Exceptions: posterior lean. L bias     Mobility Bed Mobility Bed Mobility: Supine to Sit;Sit to Supine;Rolling Right Rolling Right: With rail;4: Min assist Supine to Sit: HOB elevated;3: Mod assist;With rails Sit  to Supine: With rail;HOB flat;3: Mod assist Details for Bed Mobility Assistance: assist for trunk elevation, assist for L LE management off EOB Transfers Transfers: Sit to Stand;Stand to Sit Sit to Stand: 1: +1 Total assist;With upper extremity assist;From bed Details for Transfer Assistance: motor impersistnece LLE (poor sense of midline)     Exercise     Balance Balance Balance Assessed: Yes Static Sitting Balance Static Sitting - Balance Support: Bilateral upper extremity supported;Feet supported Static Sitting - Level of Assistance: 3: Mod assist;Other (comment) (unable to maintian midline) Static Sitting - Comment/# of Minutes: 5 C/o nausea with head turns   End of Session OT - End of Session Activity Tolerance: Other (comment) (c/o nausea) Patient left: in bed;with call bell/phone within reach;with family/visitor present Nurse Communication: Mobility status  GO     Savio Albrecht,HILLARY 06/27/2013, 6:16 PM

## 2013-06-27 NOTE — Plan of Care (Signed)
Pt is complaining of chest tightness and heaviness. MD stewart paged and waiting for md to call back.

## 2013-06-27 NOTE — Plan of Care (Signed)
Pt states chest pain is improving. EKG done. Waiting for lab work to be done. BP is around 200/110s manually.  Rapid response paged and spoke with [place] and she states to cont to monitor pt and give pt pain/anxiety meds. Notify MD. Lesly Dukes, RN night nurse is notified.

## 2013-06-27 NOTE — Progress Notes (Signed)
  Echocardiogram 2D Echocardiogram has been performed.  [place]ian Co 06/27/2013, 5:45 PM

## 2013-06-27 NOTE — Progress Notes (Signed)
Rehab Admissions Coordinator Note:  Patient was screened by Trish Mage for appropriateness for an Inpatient Acute Rehab Consult.  Noted PT recommending CIR.  At this time, we are recommending Inpatient Rehab consult.  Trish Mage 06/27/2013, 8:35 AM  I can be reached at [phone].

## 2013-06-27 NOTE — Progress Notes (Signed)
Stroke Team Progress Note  HISTORY Paul Reeves is an 27 y.o. male without known past medical history, brought to [facility] ED as a code stroke by EMS after being found lying in the bathroom by family.   He was last seen normal at 0045 today. Stated that he collapsed across the bathroom, remained conscious all the time, but couldn't get up from the floor and started calling for help. His mother said that he was struggling trying to move the left side floor.  When EMS arrived to the scene he was alert and awake, unable to move the left side, some speech impairment, and SBP 250.  Initial NIHSS 12.   CT brain reveled a 3.2 x 1.9 cm right basal ganglia/sub insular acute intraparenchymal hematoma with local mass effect, no midline shift, no intraventricular extension.   On arrival he complained of HA and nausea, but denies vertigo, double vision, difficulty swallowing, confusion, or visual disturbances.   Received 20 mg IV labetalol while awaiting for cardene drip   Date last known well: 10/1//14  Time last known well: 12:45am  tPA Given: no, ICH  NIHSS: 12  MRS: 3    Patient was not a TPA candidate secondary to hemorrhage. He was admitted to the neuro ICU for further evaluation and treatment.  SUBJECTIVE Overall he feels his condition is rapidly improving. Able to work with therapy.   OBJECTIVE Most recent Vital Signs: Filed Vitals:   06/27/13 0400 06/27/13 0500 06/27/13 0600 06/27/13 0700  BP: 158/106 167/114 181/119 138/101  Pulse: 77 72 86 67  Temp:      TempSrc:      Resp: 15 15 22 16   Height:      Weight:      SpO2: 97% 97% 99% 97%   CBG (last 3)   Recent Labs  06/26/13 0153 06/26/13 0851  GLUCAP 162* 96    IV Fluid Intake:   . niCARDipine Stopped (06/26/13 1251)    MEDICATIONS  . amLODipine  5 mg Oral Daily  . pantoprazole (PROTONIX) IV  40 mg Intravenous QHS  . senna-docusate  1 tablet Oral BID   PRN:  acetaminophen, acetaminophen, labetalol  Diet:  Cardiac    Activity:  Bedrest DVT Prophylaxis:  SCD  CLINICALLY SIGNIFICANT STUDIES Basic Metabolic Panel:   Recent Labs Lab 06/26/13 1245 06/27/13 0418  NA 138 143  K 3.3* 3.1*  CL 103 107  CO2 22 25  GLUCOSE 105* 80  BUN 11 13  CREATININE 1.25 1.50*  CALCIUM 8.7 8.7   Liver Function Tests:   Recent Labs Lab 06/26/13 0140  AST 24  ALT 21  ALKPHOS 64  BILITOT 0.5  PROT 7.4  ALBUMIN 3.6   CBC:   Recent Labs Lab 06/26/13 0140  WBC 12.0*  NEUTROABS 5.2  HGB 16.3  HCT 44.1  MCV 85.8  PLT 266   Coagulation:   Recent Labs Lab 06/26/13 0140  LABPROT 12.8  INR 0.98   Cardiac Enzymes:   Recent Labs Lab 06/26/13 0140  TROPONINI <0.30   Urinalysis: No results found for this basename: COLORURINE, APPERANCEUR, LABSPEC, PHURINE, GLUCOSEU, HGBUR, BILIRUBINUR, KETONESUR, PROTEINUR, UROBILINOGEN, NITRITE, LEUKOCYTESUR,  in the last 168 hours Lipid Panel    Component Value Date/Time   CHOL 132 06/27/2013 0418   LDL 76  HgbA1C  Lab Results  Component Value Date   HGBA1C 5.1 06/26/2013    Urine Drug Screen:      Component Value Date/Time   LABOPIA NONE DETECTED 06/26/2013  9604    Alcohol Level: No results found for this basename: ETH,  in the last 168 hours  Ct Head (brain) Wo Contrast 06/26/2013   3.2 x 1.9 cm right basal ganglia/sub insular acute intraparenchymal hematoma with local mass effect, no midline shift.    MRI of the brain    MRA of the brain    2D Echocardiogram    Carotid Doppler    Renal ultrasound  CXR    EKG    Therapy Recommendations   Physical Exam   Mental Status:  Alert, awake, oriented x 4, thought content appropriate. Mild dysarthria without evidence of aphasia. Able to follow 3 step commands without difficulty.  Cranial Nerves:  II: Discs flat bilaterally; Visual fields grossly normal, pupils equal, round, reactive to light and accommodation  III,IV, VI: ptosis not present, extra-ocular motions intact bilaterally  V:facial  light touch sensation normal bilaterally  VII: mild  Lower left face weakness.  VIII: hearing normal bilaterally  IX,X: gag reflex present  XI: bilateral shoulder shrug  XII: midline tongue extension  Motor:  Significant for mild left hemiplegia 4/5 strength with drift Tone and bulk:normal bulk throughout, decreased tone left side; no atrophy noted  Sensory: Pinprick and light touch impaired in the left side.  Deep Tendon Reflexes:  Right: Upper Extremity Left: Upper extremity  biceps (C-5 to C-6) 2/4 biceps (C-5 to C-6) 2/4  tricep (C7) 2/4 triceps (C7) 2/4  Brachioradialis (C6) 2/4 Brachioradialis (C6) 2/4  Lower Extremity Lower Extremity  quadriceps (L-2 to L-4) 2/4 quadriceps (L-2 to L-4) 2/4  Achilles (S1) 2/4 Achilles (S1) 2/4  Plantars:  Right: downgoing Left: downgoing  Cerebellar:  normal finger-to-nose, normal heel-to-shin test in the left. Couldn't be tested in the left due to hemiplegia.  Gait:  Unable to test.  CV: pulses palpable throughout    ASSESSMENT [name] is a 27 y.o. male presenting with left hemiparesis, dysarthria, headache, nausea. Imaging confirms a 3.2 x 1.9 cm right basal ganglia/sub insular acute intraparenchymal hematoma with local mass effect, no midline shift. 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  On no antithrombotics prior to admission. Now on no antithrombotics for secondary stroke prevention. Patient with resultant left hemiparesis, headache. Work up underway.   Malignant hypertension  ICH  Hypokalemia  LDL 76  HGBA1C ,  5.1  Acute dehydration  Hospital day # 1  TREATMENT/PLAN  Continue no antithrombotics for secondary stroke prevention due to Hemorrhage  Strict control of HT - Now off cardene drip and  on oral BP meds    Replace potassium, recheck potassium levels ordered  IV cardene stopped. Oral medications started.  SBP 140  Therapy evaluations  Check renal artery ultrasound, 2D  echo.  Add magnesium to labs  Replace potassium, recheck in am.  Transfer out of unit  Hydrate with IVFs, and encourage oral intake.  Gwendolyn Lima. Manson Passey, [facility], MBA, MHA [facility] Pager: [phone] 06/27/2013 7:23 AM  I have personally obtained a history, examined the patient, evaluated imaging results, and formulated the assessment and plan of care. I agree with the above. Delia Heady, MD

## 2013-06-27 NOTE — Consult Note (Signed)
Physical Medicine and Rehabilitation Consult  Reason for Consult: Left sided weakness, mild dysarthria, headaches, nausea. Referring Physician: Dr. Pearlean Brownie   HPI: Paul Reeves is a 27 y.o. male with history of HTN  who was found on the bathroom floor by family with left sided weakness, difficulty talking and inability to move on 06/26/13.  Blood pressure SBP-250 per EMS reports. 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 UDS negative. Treated with IV labetalol and started on cardene drip for BP control. Swallow evaluation without dysphagia and regular diet recommended. PT evaluation done last evening and work up underway. MD, PT recommending CIR.    Review of Systems  HENT: Negative for hearing loss.   Eyes: Negative for blurred vision and double vision.  Respiratory: Negative for cough and wheezing.   Cardiovascular: Negative for chest pain and palpitations.  Neurological: Positive for dizziness, sensory change, focal weakness and headaches.   Past Medical History  Diagnosis Date  . Hypertension 06/26/2013    new dx  . Seasonal allergies     in summer and spring.   History reviewed. No pertinent past surgical history.  Family History  Problem Relation Age of Onset  . Hypertension Father   . Diabetes Maternal Grandmother     Social History:  Lives with mother. Works at a call center and independent PTA.  He reports that he has never smoked. He does not have any smokeless tobacco history on file. He reports that he does not use illicit drugs. His alcohol history is not on file.   Allergies  Allergen Reactions  . Peanuts [Peanut Oil] Anaphylaxis  . Naproxen Rash   No prescriptions prior to admission    Home: Home Living Family/patient expects to be discharged to:: Private residence Living Arrangements: Parent Available Help at Discharge: Family;Available 24 hours/day Type of Home: House Home Access: Stairs to enter [employer] of Steps: 1 Home Layout: One level Home Equipment: None  Functional History: Prior Function Comments: pt working at call center Functional Status:  Mobility: Bed Mobility Bed Mobility: Rolling Left;Supine to Sit;Sit to Supine Rolling Right: 2: Max assist;With rail (v/c's for sequencing) Rolling Left: 3: Mod assist;With rail Supine to Sit: 2: Max assist;With rails;HOB elevated Sit to Supine: 2: Max assist;With rail;HOB flat Transfers Transfers: Sit to Stand Sit to Stand: 1: +1 Total assist;With upper extremity assist;From bed Ambulation/Gait Ambulation/Gait Assistance: Not tested (comment)    ADL:    Cognition: Cognition Overall Cognitive Status: Within Functional Limits for tasks assessed Orientation Level: Oriented X4 Cognition Arousal/Alertness: Awake/alert Behavior During Therapy: WFL for tasks assessed/performed Overall Cognitive Status: Within Functional Limits for tasks assessed  Blood pressure 138/101, pulse 67, temperature 98.5 F (36.9 C), temperature source Oral, resp. rate 16, height 6' (1.829 m), weight 101.9 kg (224 lb 10.4 oz), SpO2 97.00%.  Physical Exam  Nursing note and vitals reviewed. Constitutional: He is oriented to person, place, and time. He appears well-developed and well-nourished.  HENT:  Head: Normocephalic and atraumatic.  Eyes: Conjunctivae are normal. Pupils are equal, round, and reactive to light.  Neck: Normal range of motion. Neck supple.  Cardiovascular: Normal rate and regular rhythm.   Pulmonary/Chest: Effort normal and breath sounds normal. No respiratory distress. He has no wheezes.  Abdominal: Soft. Bowel sounds are normal. He exhibits no distension. There is tenderness.  Musculoskeletal: He exhibits no edema and no tenderness.  Neurological: He is alert and oriented to person, place, and time.  Speech clear. Left facial weakness. Left hemiparesis with sensory deficits. LUE is 3- deltoid, 3/5 bic, tric, HI. LLE is  3/5 HF, KE, ADF tr, APF 1. Sensation 1/2 left arm. Mild left facial weakness, visual fields intact. ?mild left inattention. Cognitively appropriate.   Skin: Skin is warm and dry.  Psychiatric: He has a normal mood and affect. His behavior is normal. Judgment and thought content normal.    Results for orders placed during the hospital encounter of 06/26/13 (from the past 24 hour(s))  BASIC METABOLIC PANEL     Status: Abnormal   Collection Time    06/26/13 12:45 PM      Result Value Range   Sodium 138  135 - 145 mEq/L   Potassium 3.3 (*) 3.5 - 5.1 mEq/L   Chloride 103  96 - 112 mEq/L   CO2 22  19 - 32 mEq/L   Glucose, Bld 105 (*) 70 - 99 mg/dL   BUN 11  6 - 23 mg/dL   Creatinine, Ser 9.60  0.50 - 1.35 mg/dL   Calcium 8.7  8.4 - 45.4 mg/dL   GFR calc non Af Amer 77 (*) >90 mL/min   GFR calc Af Amer 89 (*) >90 mL/min  HEMOGLOBIN A1C     Status: None   Collection Time    06/26/13 12:45 PM      Result Value Range   Hemoglobin A1C 5.1  <5.7 %   Mean Plasma Glucose 100  <117 mg/dL  BASIC METABOLIC PANEL     Status: Abnormal   Collection Time    06/27/13  4:18 AM      Result Value Range   Sodium 143  135 - 145 mEq/L   Potassium 3.1 (*) 3.5 - 5.1 mEq/L   Chloride 107  96 - 112 mEq/L   CO2 25  19 - 32 mEq/L   Glucose, Bld 80  70 - 99 mg/dL   BUN 13  6 - 23 mg/dL   Creatinine, Ser 0.98 (*) 0.50 - 1.35 mg/dL   Calcium 8.7  8.4 - 11.9 mg/dL   GFR calc non Af Amer 62 (*) >90 mL/min   GFR calc Af Amer 72 (*) >90 mL/min  LIPID PANEL     Status: None   Collection Time    06/27/13  4:18 AM      Result Value Range   Cholesterol 132  0 - 200 mg/dL   Triglycerides 68  <147 mg/dL   HDL 42  >82 mg/dL   Total CHOL/HDL Ratio 3.1     VLDL 14  0 - 40 mg/dL   LDL Cholesterol 76  0 - 99 mg/dL  MAGNESIUM     Status: None   Collection Time    06/27/13  8:00 AM      Result Value Range   Magnesium 1.9  1.5 - 2.5 mg/dL   Ct Head Wo Contrast  06/26/2013   CLINICAL DATA:  Hemorrhage.  Hypertension.  Improving headache.  EXAM: CT HEAD WITHOUT CONTRAST  TECHNIQUE: Contiguous axial images were obtained from the base of the skull through the vertex without intravenous contrast.  COMPARISON:  CT head without contrast 06/26/2013 at 1:37 a.m.  FINDINGS: A right basal ganglia hemorrhage is similar in size and configuration to the prior exam. Mild surrounding edema is more evident than on the prior study, as expected. There is some focal mass effect. No significant midline shift is present. There is partial effacement of the atrium of the right  lateral ventricle.  No new hemorrhage or mass lesion is present. No acute cortical infarct is evident. The ventricles are otherwise of normal size. No significant extra-axial fluid collection is present. No definite intraventricular blood is present.  The paranasal sinuses and mastoid air cells are clear. The osseous skull is intact.  IMPRESSION: 1. Stable right basilar ganglia hemorrhage. 2. Partial effacement of the right lateral ventricle without significant midline shift.   Electronically Signed   By: Gennette Pac   On: 06/26/2013 16:24   Ct Head (brain) Wo Contrast  06/26/2013   *RADIOLOGY REPORT*  Clinical Data: Code stroke.  CT HEAD WITHOUT CONTRAST  Technique:  Contiguous axial images were obtained from the base of the skull through the vertex without contrast.  Comparison: None available at time of study interpretation.  Findings: 3.2 x 1.9 cm hyperdense intraparenchymal hematoma within the right posterior basal ganglia/sub insular white matter.  Local mass effect, and mild surrounding low density vasogenic.  The ventricles and sulci are overall normal for patient's age. No acute large vascular territory infarct.  No abnormal extra-axial fluid collections.  Basal cisterns are patent.  Impacted uninterrupted left maxillary molar.  The paranasal sinuses and mastoid air cells appear well aerated.  No skull fracture.  Included ocular globes and orbital  contents are not suspicious.  IMPRESSION: 3.2 x 1.9 cm right basal ganglia/sub insular acute intraparenchymal hematoma with local mass effect, no midline shift.  Critical Value/emergent results were called by telephone at the time of interpretation on June 26, 2013 at 0150 hours to Dr. Judd Lien, who verbally acknowledged these results.   Original Report Authenticated By: Awilda Metro    Assessment/Plan: Diagnosis: right BH,subinsular ICH 1. Does the need for close, 24 hr/day medical supervision in concert with the patient's rehab needs make it unreasonable for this patient to be served in a less intensive setting? Yes 2. Co-Morbidities requiring supervision/potential complications: htn 3. Due to bladder management, bowel management, safety, skin/wound care, disease management, medication administration, pain management and patient education, does the patient require 24 hr/day rehab nursing? Yes 4. Does the patient require coordinated care of a physician, rehab nurse, PT (1-2 hrs/day, 5 days/week) and OT (1-2 hrs/day, 5 days/week) to address physical and functional deficits in the context of the above medical diagnosis(es)? Yes Addressing deficits in the following areas: balance, endurance, locomotion, strength, transferring, bowel/bladder control, bathing, dressing, feeding, grooming, toileting and psychosocial support 5. Can the patient actively participate in an intensive therapy program of at least 3 hrs of therapy per day at least 5 days per week? Yes 6. The potential for patient to make measurable gains while on inpatient rehab is excellent 7. Anticipated functional outcomes upon discharge from inpatient rehab are mod I with PT, mod I to supervision with OT, n/a with SLP. 8. Estimated rehab length of stay to reach the above functional goals is: 2 weeks 9. Does the patient have adequate social supports to accommodate these discharge functional goals? Yes 10. Anticipated D/C setting:  Home 11. Anticipated post D/C treatments: HH therapy 12. Overall Rehab/Functional Prognosis: excellent  RECOMMENDATIONS: This patient's condition is appropriate for continued rehabilitative care in the following setting: CIR Patient has agreed to participate in recommended program. Yes Note that insurance prior authorization may be required for reimbursement for recommended care.  Comment: Rehab RN to follow up.   Ranelle Oyster, MD, [place] [facility]     06/27/2013

## 2013-06-28 ENCOUNTER — Inpatient Hospital Stay (HOSPITAL_COMMUNITY): Payer: BC Managed Care – PPO

## 2013-06-28 DIAGNOSIS — I10 Essential (primary) hypertension: Secondary | ICD-10-CM

## 2013-06-28 DIAGNOSIS — I629 Nontraumatic intracranial hemorrhage, unspecified: Secondary | ICD-10-CM

## 2013-06-28 LAB — BASIC METABOLIC PANEL
BUN: 12 mg/dL (ref 6–23)
Calcium: 8.6 mg/dL (ref 8.4–10.5)
Chloride: 105 mEq/L (ref 96–112)
Creatinine, Ser: 1.43 mg/dL — ABNORMAL HIGH (ref 0.50–1.35)
GFR calc Af Amer: 76 mL/min — ABNORMAL LOW (ref >90)
GFR calc non Af Amer: 66 mL/min — ABNORMAL LOW (ref >90)
Glucose, Bld: 93 mg/dL (ref 70–99)
Potassium: 3.3 mEq/L — ABNORMAL LOW (ref 3.5–5.1)

## 2013-06-28 MED ORDER — HALOPERIDOL LACTATE 5 MG/ML IJ SOLN
1.0000 mg | Freq: Once | INTRAMUSCULAR | Status: AC
  Administered 2013-06-28: 1 mg via INTRAVENOUS

## 2013-06-28 MED ORDER — LORAZEPAM 1 MG PO TABS
2.0000 mg | ORAL_TABLET | Freq: Once | ORAL | Status: AC
  Administered 2013-06-28: 2 mg via ORAL
  Filled 2013-06-28 (×2): qty 2

## 2013-06-28 MED ORDER — GADOBENATE DIMEGLUMINE 529 MG/ML IV SOLN
20.0000 mL | Freq: Once | INTRAVENOUS | Status: AC
  Administered 2013-06-28: 20 mL via INTRAVENOUS

## 2013-06-28 MED ORDER — METOPROLOL TARTRATE 25 MG PO TABS
25.0000 mg | ORAL_TABLET | Freq: Two times a day (BID) | ORAL | Status: DC
  Administered 2013-06-28 – 2013-06-29 (×4): 25 mg via ORAL
  Filled 2013-06-28 (×5): qty 1

## 2013-06-28 MED ORDER — HYDRALAZINE HCL 20 MG/ML IJ SOLN
10.0000 mg | Freq: Once | INTRAMUSCULAR | Status: AC
  Administered 2013-06-28: 10 mg via INTRAVENOUS
  Filled 2013-06-28: qty 1

## 2013-06-28 MED ORDER — AMLODIPINE BESYLATE 10 MG PO TABS
10.0000 mg | ORAL_TABLET | Freq: Every day | ORAL | Status: DC
  Administered 2013-06-29 – 2013-07-01 (×3): 10 mg via ORAL
  Filled 2013-06-28 (×3): qty 1

## 2013-06-28 MED ORDER — HALOPERIDOL LACTATE 5 MG/ML IJ SOLN
INTRAMUSCULAR | Status: AC
  Administered 2013-06-28: 1 mg via INTRAVENOUS
  Filled 2013-06-28: qty 1

## 2013-06-28 NOTE — Progress Notes (Signed)
Pt BP rechecked frequently for high BP. Pt current BP 159/106 mmHg; HR 87. MD on-call (Dr. Roseanne Reno) aware and orders made. Will continue to monitor pt closely.

## 2013-06-28 NOTE — Progress Notes (Signed)
Physical Therapy Treatment Patient Details Name: Paul Reeves MRN: [id number] DOB: Jan 13, 1986 Today's Date: 06/28/2013 Time: 7846-9629 PT Time Calculation (min): 18 min  PT Assessment / Plan / Recommendation  History of Present Illness 27 y.o. male without known past medical history, brought to [facility] ED as a code stroke by EMS after being found lying in the bathroom by family. CT brain reveled a 3.2 x 1.9 cm right basal ganglia/sub insular acute intraparenchymal hematoma with local mass effect, no midline shift, no intraventricular extension.    PT Comments   Pt continues to have limited tolerance of mobility due to dizziness he experiences when he is sitting upright (central vestibular issues).  He kept his eyes closed 90% of the session.  We attempted compensating with looking at a target in static sitting, but he did not feel this strategy was helping.  He was able to get OOB to chair with two person assist today and was going to try to sit up for an hour.  I think if he is willing and able next session should be pre-gait and/or gait with L PFRW and two person assist.    Follow Up Recommendations  CIR     Does the patient have the potential to tolerate intense rehabilitation    Yes  Barriers to Discharge   None      Equipment Recommendations  Wheelchair (measurements PT);Wheelchair cushion (measurements PT)    Recommendations for Other Services   None  Frequency Min 4X/week   Progress towards PT Goals Progress towards PT goals: Progressing toward goals  Plan Current plan remains appropriate    Precautions / Restrictions Precautions Precautions: Fall Precaution Comments: left hemiperesis, dizziness/nausea in sitting   Pertinent Vitals/Pain See vitals flow sheet.    Mobility  Bed Mobility Supine to Sit: With rails;HOB flat Sitting - Scoot to Edge of Bed: 4: Min assist;With rail Sit to Supine: 4: Min assist Details for Bed Mobility Assistance: min assist due to speed of  movement and pt over estimated muscle pull with right arm and threw himself to the left to get to sitting.  Pt using railing and momentum to get to EOB.   Transfers Transfers: Stand to Sit;Sit to Stand;Stand Pivot Transfers Sit to Stand: 1: +2 Total assist;With upper extremity assist;With armrests;From bed Sit to Stand: Patient Percentage: 70% Stand to Sit: 1: +2 Total assist;Without upper extremity assist;With armrests;To chair/3-in-1 Stand to Sit: Patient Percentage: 70% Stand Pivot Transfers: 1: +2 Total assist;With armrests Stand Pivot Transfers: Patient Percentage: 60% Details for Transfer Assistance: Assist needed to help pt stabilize for balance in standing with right arm and hand.  Left knee blocked due to buckling.  Pt took 2-3 pivotal steps around to the recliner chair on his right side Ambulation/Gait Ambulation/Gait Assistance: Not tested (comment) (pt not feeling up to trying walking today due to dizziness) Modified Rankin (Stroke Patients Only) Pre-Morbid Rankin Score: No symptoms Modified Rankin: Severe disability      PT Goals (current goals can now be found in the care plan section) Acute Rehab PT Goals Patient Stated Goal: home  Visit Information  Last PT Received On: 06/28/13 Assistance Needed: +2 History of Present Illness: 27 y.o. male without known past medical history, brought to [facility] ED as a code stroke by EMS after being found lying in the bathroom by family. CT brain reveled a 3.2 x 1.9 cm right basal ganglia/sub insular acute intraparenchymal hematoma with local mass effect, no midline shift, no intraventricular extension.  Subjective Data  Subjective: "Come on ya'll let's do this" per pt due to not feeling well in sitting EOB.   Patient Stated Goal: home   Cognition  Cognition Arousal/Alertness: Lethargic Behavior During Therapy: WFL for tasks assessed/performed Overall Cognitive Status: Impaired/Different from baseline Area of Impairment:  Attention;Awareness;Problem solving Current Attention Level: Sustained Awareness: Emergent Problem Solving: Slow processing;Requires tactile cues;Requires verbal cues General Comments: Pt impulsive, quick to move, eyes closed much of session due to dizziness.  Lethargic due to therapist awoke him from sleeping.  Seems as though he is sleeping most of the day.      Balance  Balance Balance Assessed: Yes Static Sitting Balance Static Sitting - Balance Support: Right upper extremity supported;No upper extremity supported;Feet supported Static Sitting - Level of Assistance: 4: Min assist Dynamic Sitting Balance Dynamic Sitting - Balance Support: No upper extremity supported;Feet supported Dynamic Sitting - Level of Assistance: 3: Mod assist Dynamic Sitting - Comments: LOB left and posteriorly.  Seated EOB worked on balancing with right hand, then with no hands, scooting, trying to use a target to decrease spinning/dizziness.   Static Standing Balance Static Standing - Balance Support: Right upper extremity supported Static Standing - Level of Assistance: 1: +2 Total assist;Patient percentage (comment) (pt 70%) Dynamic Standing Balance Dynamic Standing - Balance Support: Right upper extremity supported Dynamic Standing - Level of Assistance: 1: +2 Total assist;Patient percentage (comment) (pt 60%)  End of Session PT - End of Session Equipment Utilized During Treatment: Gait belt Activity Tolerance: Patient limited by fatigue;Other (comment) (limited by dizziness) Patient left: in chair;with call bell/phone within reach;with family/visitor present    Lurena Joiner B. Carmin Dibartolo, PT, DPT [phone]   06/28/2013, 3:54 PM

## 2013-06-28 NOTE — Progress Notes (Signed)
SLP Cancellation Note  Patient Details Name: Paul Reeves MRN: [id number] DOB: October 09, 1985   Cancelled treatment:       Reason Eval/Treat Not Completed:  (Pt refused). SLP attempted cognitive linguistic eval. Pt in bed needing help to use urinal. SLP helped pt and began eval, pt stated "I just want to sleep, i'm tired of being poked and prodded, lets do this later." SLP will attempt next date.    Tayjah Lobdell, Riley Nearing 06/28/2013, 1:45 PM

## 2013-06-28 NOTE — Progress Notes (Signed)
Rehab admissions - Evaluated for possible admission.  I met with patient's aunt today.  I called patient's mom.  Patient has [employer] through his work.  I will contact them to begin authorization process for acute inpatient rehab admission.  I will follow back up on Monday for progress and plans.  Call me for questions.  #[phone]

## 2013-06-28 NOTE — Progress Notes (Signed)
*  PRELIMINARY RESULTS* Vascular Ultrasound Carotid Duplex (Doppler) and Renal Artery Duplex has been completed.  Preliminary findings:   Bilateral:  1-39% internal carotid artery stenosis.  Vertebral artery flow is antegrade.   No evidence of renal artery stenosis bilaterally.     Farrel Demark, RDMS, RVT  06/28/2013, 12:23 PM

## 2013-06-28 NOTE — Progress Notes (Signed)
Stroke Team Progress Note  HISTORY Paul Reeves is an 27 y.o. male without known past medical history, brought to [facility] ED as a code stroke by EMS after being found lying in the bathroom by family.   He was last seen normal at 0045 today. Stated that he collapsed across the bathroom, remained conscious all the time, but couldn't get up from the floor and started calling for help. His mother said that he was struggling trying to move the left side floor.  When EMS arrived to the scene he was alert and awake, unable to move the left side, some speech impairment, and SBP 250.  Initial NIHSS 12.   CT brain reveled a 3.2 x 1.9 cm right basal ganglia/sub insular acute intraparenchymal hematoma with local mass effect, no midline shift, no intraventricular extension.   On arrival he complained of HA and nausea, but denies vertigo, double vision, difficulty swallowing, confusion, or visual disturbances.   Received 20 mg IV labetalol while awaiting for cardene drip   Date last known well: 10/1//14  Time last known well: 12:45am  tPA Given: no, ICH  NIHSS: 12  MRS: 3    Patient was not a TPA candidate secondary to hemorrhage. He was admitted to the neuro ICU for further evaluation and treatment.  SUBJECTIVE Working with therapy. In MRI when seen. No new neurological changes noted.  OBJECTIVE Most recent Vital Signs: Filed Vitals:   06/28/13 0043 06/28/13 0125 06/28/13 0554 06/28/13 0748  BP: 159/106 159/105 169/98 160/97  Pulse: 87 91 92 99  Temp:  98.5 F (36.9 C) 99 F (37.2 C)   TempSrc:  Oral Oral   Resp:  20 20   Height:      Weight:      SpO2:  99% 99% 96%   CBG (last 3)   Recent Labs  06/26/13 0153 06/26/13 0851  GLUCAP 162* 96    IV Fluid Intake:   . sodium chloride 75 mL/hr at 06/27/13 1717    MEDICATIONS  . amLODipine  5 mg Oral Daily  . LORazepam  2 mg Oral Once  . metoprolol tartrate  25 mg Oral BID  . pantoprazole  40 mg Oral Daily  . potassium chloride   20 mEq Oral BID  . senna-docusate  1 tablet Oral BID   PRN:  acetaminophen, acetaminophen, ondansetron (ZOFRAN) IV  Diet:  Cardiac   Activity:  ambulate DVT Prophylaxis:  SCD  CLINICALLY SIGNIFICANT STUDIES Basic Metabolic Panel:   Recent Labs Lab 06/27/13 0418 06/27/13 0800 06/27/13 2212 06/28/13 0635  NA 143  --  141 138  K 3.1*  --  3.3* 3.3*  CL 107  --  107 105  CO2 25  --  23 22  GLUCOSE 80  --  106* 93  BUN 13  --  12 12  CREATININE 1.50*  --  1.35 1.43*  CALCIUM 8.7  --  8.6 8.6  MG  --  1.9  --   --    Liver Function Tests:   Recent Labs Lab 06/26/13 0140  AST 24  ALT 21  ALKPHOS 64  BILITOT 0.5  PROT 7.4  ALBUMIN 3.6   CBC:   Recent Labs Lab 06/26/13 0140  WBC 12.0*  NEUTROABS 5.2  HGB 16.3  HCT 44.1  MCV 85.8  PLT 266   Coagulation:   Recent Labs Lab 06/26/13 0140  LABPROT 12.8  INR 0.98   Cardiac Enzymes:   Recent Labs Lab 06/26/13 0140  TROPONINI <0.30   Urinalysis: No results found for this basename: COLORURINE, APPERANCEUR, LABSPEC, PHURINE, GLUCOSEU, HGBUR, BILIRUBINUR, KETONESUR, PROTEINUR, UROBILINOGEN, NITRITE, LEUKOCYTESUR,  in the last 168 hours Lipid Panel    Component Value Date/Time   CHOL 132 06/27/2013 0418   LDL 76  MG  1.9  HgbA1C  Lab Results  Component Value Date   HGBA1C 5.1 06/26/2013    Urine Drug Screen:      Component Value Date/Time   LABOPIA NONE DETECTED 06/26/2013 8119    Alcohol Level: No results found for this basename: ETH,  in the last 168 hours  Ct Head (brain) Wo Contrast 06/26/2013   3.2 x 1.9 cm right basal ganglia/sub insular acute intraparenchymal hematoma with local mass effect, no midline shift.    MRI of the brain  Patient unable to tolerate MRA of the brain    2D Echocardiogram  EF 55%, wall motion normal, left atrium mildly dilated. No ASD or PFO identified.  Carotid Doppler--  Bilateral: 1-39% internal carotid artery stenosis. Vertebral artery flow is antegrade.    Renal ultrasound--No evidence of renal artery stenosis bilaterally.   CXR    EKG    Therapy Recommendations CIR  Physical Exam   Mental Status:  Alert, awake, oriented x 4, thought content appropriate. Mild dysarthria without evidence of aphasia. Able to follow 3 step commands without difficulty.  Cranial Nerves:  II: Discs flat bilaterally; Visual fields grossly normal, pupils equal, round, reactive to light and accommodation  III,IV, VI: ptosis not present, extra-ocular motions intact bilaterally  V:facial light touch sensation normal bilaterally  VII: mild  Lower left face weakness.  VIII: hearing normal bilaterally  IX,X: gag reflex present  XI: bilateral shoulder shrug  XII: midline tongue extension  Motor:  Significant for mild left hemiplegia 4/5 strength with drift Tone and bulk:normal bulk throughout, decreased tone left side; no atrophy noted  Sensory: Pinprick and light touch impaired in the left side.  Deep Tendon Reflexes:  Right: Upper Extremity Left: Upper extremity  biceps (C-5 to C-6) 2/4 biceps (C-5 to C-6) 2/4  tricep (C7) 2/4 triceps (C7) 2/4  Brachioradialis (C6) 2/4 Brachioradialis (C6) 2/4  Lower Extremity Lower Extremity  quadriceps (L-2 to L-4) 2/4 quadriceps (L-2 to L-4) 2/4  Achilles (S1) 2/4 Achilles (S1) 2/4  Plantars:  Right: downgoing Left: downgoing  Cerebellar:  normal finger-to-nose, normal heel-to-shin test in the left. Couldn't be tested in the left due to hemiplegia.  Gait:  Unable to test.  CV: pulses palpable throughout    ASSESSMENT [name] is a 27 y.o. male presenting with left hemiparesis, dysarthria, headache, nausea. Imaging confirms a 3.2 x 1.9 cm right basal ganglia/sub insular acute intraparenchymal hematoma with local mass effect, no midline shift. 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  On no antithrombotics prior to admission. Now on no antithrombotics for secondary stroke prevention.  Patient with resultant left hemiparesis, headache. Work up underway.   Malignant hypertension  ICH  Hypokalemia  LDL 76, at goal, no statin indicated.  HGBA1C ,  5.1  Acute dehydration  Hospital day # 2  TREATMENT/PLAN  Continue no antithrombotics for secondary stroke prevention due to Hemorrhage  Strict control of HT - Now off cardene drip, uptitration of oral BP meds    Replace potassium, recheck in am.  Hydrate with IVFs, and encourage oral intake.  Patient claustrophobic of MRI, even with medications for relaxation; therefore, will obtain head CT in am for follow up.  Gwendolyn Lima. Manson Passey, PAC, MBA, MHA [facility] Pager: [phone] 06/28/2013 10:00 AM  I have personally obtained a history, examined the patient, evaluated imaging results, and formulated the assessment and plan of care. I agree with the above.  Delia Heady, MD

## 2013-06-29 ENCOUNTER — Inpatient Hospital Stay (HOSPITAL_COMMUNITY): Payer: BC Managed Care – PPO

## 2013-06-29 LAB — BASIC METABOLIC PANEL
BUN: 16 mg/dL (ref 6–23)
CO2: 18 mEq/L — ABNORMAL LOW (ref 19–32)
Calcium: 8.6 mg/dL (ref 8.4–10.5)
Chloride: 109 mEq/L (ref 96–112)
Creatinine, Ser: 1.41 mg/dL — ABNORMAL HIGH (ref 0.50–1.35)
GFR calc non Af Amer: 67 mL/min — ABNORMAL LOW (ref >90)
Glucose, Bld: 79 mg/dL (ref 70–99)

## 2013-06-29 MED ORDER — HYDRALAZINE HCL 20 MG/ML IJ SOLN
10.0000 mg | Freq: Four times a day (QID) | INTRAMUSCULAR | Status: DC | PRN
  Administered 2013-06-29: 10 mg via INTRAVENOUS
  Filled 2013-06-29: qty 1

## 2013-06-29 MED ORDER — METOPROLOL TARTRATE 50 MG PO TABS
50.0000 mg | ORAL_TABLET | Freq: Two times a day (BID) | ORAL | Status: DC
  Administered 2013-06-29 – 2013-07-01 (×4): 50 mg via ORAL
  Filled 2013-06-29 (×6): qty 1

## 2013-06-29 MED ORDER — METOPROLOL TARTRATE 1 MG/ML IV SOLN
5.0000 mg | Freq: Once | INTRAVENOUS | Status: AC
  Administered 2013-06-29: 5 mg via INTRAVENOUS
  Filled 2013-06-29: qty 5

## 2013-06-29 MED ORDER — LABETALOL HCL 5 MG/ML IV SOLN
5.0000 mg | INTRAVENOUS | Status: DC | PRN
  Administered 2013-06-29 – 2013-07-01 (×6): 5 mg via INTRAVENOUS
  Filled 2013-06-29 (×5): qty 4

## 2013-06-29 NOTE — Progress Notes (Signed)
SLP Cancellation Note  Patient Details Name: Arron Tetrault MRN: [id number] DOB: 10/08/1985   Cancelled treatment:       Reason Eval/Treat Not Completed: Patient at procedure or test/unavailable;Fatigue/lethargy limiting ability to participate;Other (comment) (2 attempts; might reattempt later in PM)   [place], Amy K, MA, CCC-SLP 06/29/2013, 12:03 PM

## 2013-06-29 NOTE — Progress Notes (Signed)
Stroke Team Progress Note  HISTORY  Paul Reeves is a 27 y.o. male without known past medical history, brought to [facility] ED 06/26/2013, as a code stroke by EMS after being found lying in the bathroom by family.   He was last seen normal at 0045 that day. Stated that he collapsed across the bathroom, remained conscious all the time, but couldn't get up from the floor and started calling for help. His mother said that he was struggling trying to move the left side floor.  When EMS arrived to the scene he was alert and awake, unable to move the left side, some speech impairment, and SBP 250.   Initial NIHSS 12.   CT brain reveled a 3.2 x 1.9 cm right basal ganglia/sub insular acute intraparenchymal hematoma with local mass effect, no midline shift, no intraventricular extension.   On arrival he complained of HA and nausea, but denies vertigo, double vision, difficulty swallowing, confusion, or visual disturbances.   Received 20 mg IV labetalol while awaiting for cardene drip   Date last known well: 10/1//14  Time last known well: 12:45am  tPA Given: no, ICH  NIHSS: 12  MRS: 3    Patient was not a TPA candidate secondary to hemorrhage. He was admitted to the neuro ICU for further evaluation and treatment.  SUBJECTIVE He was unable to do MRI despite sedation 06/27/13 hence Ct head repeated this am which showe stable hematoma appearance. BP has been mildly elevated requiring prn hydralazine but he gets nausea with it.   OBJECTIVE Most recent Vital Signs: Filed Vitals:   06/28/13 2359 06/29/13 0005 06/29/13 0205 06/29/13 0531  BP: 162/121 160/110 162/109 161/105  Pulse: 93  96 81  Temp:      TempSrc:      Resp:    16  Height:      Weight:      SpO2:   95% 99%   CBG (last 3)   Recent Labs  06/26/13 0851  GLUCAP 96    IV Fluid Intake:   . sodium chloride 75 mL/hr at 06/27/13 1717    MEDICATIONS  . amLODipine  10 mg Oral Daily  . metoprolol tartrate  25 mg Oral BID  .  pantoprazole  40 mg Oral Daily  . senna-docusate  1 tablet Oral BID   PRN:  acetaminophen, acetaminophen, ondansetron (ZOFRAN) IV  Diet:  Cardiac   Activity:  ambulate DVT Prophylaxis:  SCD  CLINICALLY SIGNIFICANT STUDIES Basic Metabolic Panel:   Recent Labs Lab 06/27/13 0418 06/27/13 0800 06/27/13 2212 06/28/13 0635  NA 143  --  141 138  K 3.1*  --  3.3* 3.3*  CL 107  --  107 105  CO2 25  --  23 22  GLUCOSE 80  --  106* 93  BUN 13  --  12 12  CREATININE 1.50*  --  1.35 1.43*  CALCIUM 8.7  --  8.6 8.6  MG  --  1.9  --   --    Liver Function Tests:   Recent Labs Lab 06/26/13 0140  AST 24  ALT 21  ALKPHOS 64  BILITOT 0.5  PROT 7.4  ALBUMIN 3.6   CBC:   Recent Labs Lab 06/26/13 0140  WBC 12.0*  NEUTROABS 5.2  HGB 16.3  HCT 44.1  MCV 85.8  PLT 266   Coagulation:   Recent Labs Lab 06/26/13 0140  LABPROT 12.8  INR 0.98   Cardiac Enzymes:   Recent Labs Lab 06/26/13 0140  TROPONINI <0.30   Urinalysis: No results found for this basename: COLORURINE, APPERANCEUR, LABSPEC, PHURINE, GLUCOSEU, HGBUR, BILIRUBINUR, KETONESUR, PROTEINUR, UROBILINOGEN, NITRITE, LEUKOCYTESUR,  in the last 168 hours Lipid Panel    Component Value Date/Time   CHOL 132 06/27/2013 0418   LDL 76  MG  1.9  HgbA1C  Lab Results  Component Value Date   HGBA1C 5.1 06/26/2013    Urine Drug Screen:      Component Value Date/Time   LABOPIA NONE DETECTED 06/26/2013 1610    Alcohol Level: No results found for this basename: ETH,  in the last 168 hours  Ct Head (brain) Wo Contrast 06/26/2013    3.2 x 1.9 cm right basal ganglia/sub insular acute intraparenchymal hematoma with local mass effect, no midline shift.     MRI of the brain  Patient unable to tolerate MRA of the brain    2D Echocardiogram  EF 55%, wall motion normal, left atrium mildly dilated. No ASD or PFO identified.  Carotid Doppler--  Bilateral: 1-39% internal carotid artery stenosis. Vertebral artery flow is  antegrade.   Renal ultrasound--No evidence of renal artery stenosis bilaterally.   CXR    EKG  normal sinus rhythm rate 91 beats per minute  Therapy Recommendations CIR  Physical Exam   Mental Status:  Alert, awake, oriented x 4, thought content appropriate. Mild dysarthria without evidence of aphasia. Able to follow 3 step commands without difficulty.  Cranial Nerves:  II: Discs flat bilaterally; Visual fields grossly normal, pupils equal, round, reactive to light and accommodation  III,IV, VI: ptosis not present, extra-ocular motions intact bilaterally  V:facial light touch sensation normal bilaterally  VII: mild  Lower left face weakness.  VIII: hearing normal bilaterally  IX,X: gag reflex present  XI: bilateral shoulder shrug  XII: midline tongue extension  Motor:  Significant for mild left hemiplegia 4/5 strength with drift Tone and bulk:normal bulk throughout, decreased tone left side; no atrophy noted  Sensory: Pinprick and light touch impaired in the left side.  Deep Tendon Reflexes:  Right: Upper Extremity Left: Upper extremity  biceps (C-5 to C-6) 2/4 biceps (C-5 to C-6) 2/4  tricep (C7) 2/4 triceps (C7) 2/4  Brachioradialis (C6) 2/4 Brachioradialis (C6) 2/4  Lower Extremity Lower Extremity  quadriceps (L-2 to L-4) 2/4 quadriceps (L-2 to L-4) 2/4  Achilles (S1) 2/4 Achilles (S1) 2/4  Plantars:  Right: downgoing Left: downgoing  Cerebellar:  normal finger-to-nose, normal heel-to-shin test in the left. Couldn't be tested in the left due to hemiplegia.  Gait:  Unable to test.  CV: pulses palpable throughout    ASSESSMENT [name] is a 27 y.o. male presenting with left hemiparesis, dysarthria, headache, nausea. Imaging confirms a 3.2 x 1.9 cm right basal ganglia/sub insular acute intraparenchymal hematoma with local mass effect, no midline shift. 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  On no antithrombotics prior to admission. Now on  no antithrombotics for secondary stroke prevention. Patient with resultant left hemiparesis, headache. Work up underway.   Malignant hypertension  ICH  Hypokalemia  LDL 76, at goal, no statin indicated.  HGBA1C ,  5.1  Acute dehydration  Hospital day # 3  TREATMENT/PLAN  Continue no antithrombotics for secondary stroke prevention due to Hemorrhage  Strict control of HT - Now off cardene drip, uptitration of oral BP meds. PRNs reordered. Consider ACEI.  Replace potassium, recheck in am. BMP pending.  Hydrate with IVFs, and encourage oral intake.  Patient claustrophobic of MRI, even with medications  for relaxation. Head CT stable today.  Delton See PA-C [facility] Pager [phone] 06/29/2013, 7:43 AM  I have personally obtained a history, examined the patient, evaluated imaging results, and formulated the assessment and plan of care. I agree with the above.  Delia Heady, MD

## 2013-06-30 LAB — BASIC METABOLIC PANEL
BUN: 14 mg/dL (ref 6–23)
CO2: 22 mEq/L (ref 19–32)
Calcium: 8.7 mg/dL (ref 8.4–10.5)
Creatinine, Ser: 1.32 mg/dL (ref 0.50–1.35)
Glucose, Bld: 92 mg/dL (ref 70–99)
Potassium: 3.6 mEq/L (ref 3.5–5.1)

## 2013-06-30 MED ORDER — LABETALOL HCL 5 MG/ML IV SOLN
10.0000 mg | Freq: Once | INTRAVENOUS | Status: AC
  Administered 2013-06-30: 10 mg via INTRAVENOUS
  Filled 2013-06-30: qty 4

## 2013-06-30 MED ORDER — BENAZEPRIL HCL 10 MG PO TABS
10.0000 mg | ORAL_TABLET | Freq: Every day | ORAL | Status: DC
  Administered 2013-06-30 – 2013-07-01 (×2): 10 mg via ORAL
  Filled 2013-06-30 (×2): qty 1

## 2013-06-30 NOTE — Progress Notes (Signed)
Stroke Team Progress Note  HISTORY  Paul Reeves is a 27 y.o. male without known past medical history, brought to [facility] ED 06/26/2013, as a code stroke by EMS after being found lying in the bathroom by family.   He was last seen normal at 0045 that day. Stated that he collapsed across the bathroom, remained conscious all the time, but couldn't get up from the floor and started calling for help. His mother said that he was struggling trying to move the left side floor.  When EMS arrived to the scene he was alert and awake, unable to move the left side, some speech impairment, and SBP 250.   Initial NIHSS 12.   CT brain reveled a 3.2 x 1.9 cm right basal ganglia/sub insular acute intraparenchymal hematoma with local mass effect, no midline shift, no intraventricular extension.   On arrival he complained of HA and nausea, but denies vertigo, double vision, difficulty swallowing, confusion, or visual disturbances.   Received 20 mg IV labetalol while awaiting for cardene drip   Date last known well: 10/1//14  Time last known well: 12:45am  tPA Given: no, ICH  NIHSS: 12  MRS: 3    Patient was not a TPA candidate secondary to hemorrhage. He was admitted to the neuro ICU for further evaluation and treatment.  SUBJECTIVE There are no family members present. The patient has no complaints. He feels his left arm is getting stronger. His blood pressure is still running high. We discussed with the patient the importance of good blood pressure control.   OBJECTIVE Most recent Vital Signs: Filed Vitals:   06/29/13 2112 06/30/13 0151 06/30/13 0500 06/30/13 0736  BP: 176/113 156/103 170/109 161/111  Pulse: 87 81 85 80  Temp: 98.6 F (37 C) 98.3 F (36.8 C) 98.4 F (36.9 C) 98.1 F (36.7 C)  TempSrc:    Oral  Resp: 18 16 16 20   Height:      Weight:      SpO2: 99% 98% 99% 100%   CBG (last 3)  No results found for this basename: GLUCAP,  in the last 72 hours  IV Fluid Intake:   . sodium  chloride 75 mL/hr at 06/29/13 2354    MEDICATIONS  . amLODipine  10 mg Oral Daily  . metoprolol tartrate  50 mg Oral BID  . pantoprazole  40 mg Oral Daily  . senna-docusate  1 tablet Oral BID   PRN:  acetaminophen, acetaminophen, labetalol, ondansetron (ZOFRAN) IV  Diet:  Cardiac   Activity:  ambulate DVT Prophylaxis:  SCD  CLINICALLY SIGNIFICANT STUDIES Basic Metabolic Panel:   Recent Labs Lab 06/27/13 0418 06/27/13 0800  06/29/13 0550 06/30/13 0600  NA 143  --   < > 142 140  K 3.1*  --   < > 3.8 3.6  CL 107  --   < > 109 108  CO2 25  --   < > 18* 22  GLUCOSE 80  --   < > 79 92  BUN 13  --   < > 16 14  CREATININE 1.50*  --   < > 1.41* 1.32  CALCIUM 8.7  --   < > 8.6 8.7  MG  --  1.9  --   --   --   < > = values in this interval not displayed. Liver Function Tests:   Recent Labs Lab 06/26/13 0140  AST 24  ALT 21  ALKPHOS 64  BILITOT 0.5  PROT 7.4  ALBUMIN 3.6  CBC:   Recent Labs Lab 06/26/13 0140  WBC 12.0*  NEUTROABS 5.2  HGB 16.3  HCT 44.1  MCV 85.8  PLT 266   Coagulation:   Recent Labs Lab 06/26/13 0140  LABPROT 12.8  INR 0.98   Cardiac Enzymes:   Recent Labs Lab 06/26/13 0140  TROPONINI <0.30   Urinalysis: No results found for this basename: COLORURINE, APPERANCEUR, LABSPEC, PHURINE, GLUCOSEU, HGBUR, BILIRUBINUR, KETONESUR, PROTEINUR, UROBILINOGEN, NITRITE, LEUKOCYTESUR,  in the last 168 hours Lipid Panel    Component Value Date/Time   CHOL 132 06/27/2013 0418   LDL 76  MG  1.9  HgbA1C  Lab Results  Component Value Date   HGBA1C 5.1 06/26/2013    Urine Drug Screen:      Component Value Date/Time   LABOPIA NONE DETECTED 06/26/2013 9811    Alcohol Level: No results found for this basename: ETH,  in the last 168 hours  Ct Head (brain) Wo Contrast 06/26/2013    3.2 x 1.9 cm right basal ganglia/sub insular acute intraparenchymal hematoma with local mass effect, no midline shift.    Head CT - Repeat 06/29/2013 Stable acute  parenchymal hemorrhage over the posterior right basal  ganglia measuring 1.9 x 3.0 cm in transverse and AP dimensions with  slightly more adjacent edema. No additional foci of acute  hemorrhage.   MRI of the brain  Patient unable to tolerate MRA of the brain    2D Echocardiogram  EF 55%, wall motion normal, left atrium mildly dilated. No ASD or PFO identified.  Carotid Doppler--  Bilateral: 1-39% internal carotid artery stenosis. Vertebral artery flow is antegrade.   Renal ultrasound--No evidence of renal artery stenosis bilaterally.   CXR    EKG  normal sinus rhythm rate 91 beats per minute  Therapy Recommendations CIR  Physical Exam   Mental Status:  Alert, awake, oriented x 4, thought content appropriate. Mild dysarthria without evidence of aphasia. Able to follow 3 step commands without difficulty.  Cranial Nerves:  II: Discs flat bilaterally; Visual fields grossly normal, pupils equal, round, reactive to light and accommodation  III,IV, VI: ptosis not present, extra-ocular motions intact bilaterally  V:facial light touch sensation normal bilaterally  VII: mild  Lower left face weakness.  VIII: hearing normal bilaterally  IX,X: gag reflex present  XI: bilateral shoulder shrug  XII: midline tongue extension  Motor:  Significant for mild left hemiplegia 4/5 strength with drift Tone and bulk:normal bulk throughout, decreased tone left side; no atrophy noted  Sensory: Pinprick and light touch impaired in the left side.  Deep Tendon Reflexes:  Right: Upper Extremity Left: Upper extremity  biceps (C-5 to C-6) 2/4 biceps (C-5 to C-6) 2/4  tricep (C7) 2/4 triceps (C7) 2/4  Brachioradialis (C6) 2/4 Brachioradialis (C6) 2/4  Lower Extremity Lower Extremity  quadriceps (L-2 to L-4) 2/4 quadriceps (L-2 to L-4) 2/4  Achilles (S1) 2/4 Achilles (S1) 2/4  Plantars:  Right: downgoing Left: downgoing  Cerebellar:  normal finger-to-nose, normal heel-to-shin test in the left. Couldn't  be tested in the left due to hemiplegia.  Gait:  Unable to test.  CV: pulses palpable throughout    ASSESSMENT Paul Reeves is a 27 y.o. male presenting with left hemiparesis, dysarthria, headache, nausea. Imaging confirms a 3.2 x 1.9 cm right basal ganglia/sub insular acute intraparenchymal hematoma with local mass effect, no midline shift. 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  On no antithrombotics prior to admission. Now on no antithrombotics for secondary stroke prevention.  Patient with resultant left hemiparesis, headache. Work up underway.   Malignant hypertension  ICH  Hypokalemia - improved - potassium 3.6 today  LDL 76, at goal, no statin indicated.  HGBA1C ,  5.1  Acute dehydration  Hospital day # 4  TREATMENT/PLAN  Continue no antithrombotics for secondary stroke prevention due to Hemorrhage  Strict control of HT - Now off cardene drip. Did not tolerate when necessary hydralazine secondary to nausea. When necessary labetalol ordered. Metoprolol increased to 50 mg twice a day. Monitor for bradycardia. Also on Norvasc 10 mg daily. Will add ACE inhibitor as discussed with Dr. Nona Dell. Monitor renal function. Could also add low-dose diuretic if needed. Bmet ordered for Tuesday morning.  Potassium 3.6 today. No longer receiving potassium supplement.  IV converted to a saline lock -  encourage oral intake.  Patient claustrophobic of MRI, even with medications for relaxation. Head CT stable 06/29/2013  (see above)  CIR recommended. Dr.Swartz has seen patient and agrees.  Could probably consider subcutaneous heparin or Lovenox at this point for DVT prophylaxis.  Delton See PA-C [facility] Pager [phone] 06/30/2013, 8:10 AM  I have personally obtained a history, examined the patient, evaluated imaging results, and formulated the assessment and plan of care. I agree with the above.     Awaiting for rehab  placement. Add lisinopril to current BP management. Keep SBP <160. Improvement in LUE and LLE strength compared to admission .   Pauletta Browns

## 2013-06-30 NOTE — Progress Notes (Signed)
Called MD [facility] on call due to patient's BP of 188/130, HR 79. Had given a 5mg  dose of labetolol to immediately cover this, he ordered an addition 10mg  of labetolol now.  Minor, Yvette Rack

## 2013-07-01 ENCOUNTER — Encounter (HOSPITAL_COMMUNITY): Payer: Self-pay | Admitting: *Deleted

## 2013-07-01 ENCOUNTER — Inpatient Hospital Stay (HOSPITAL_COMMUNITY)
Admission: RE | Admit: 2013-07-01 | Discharge: 2013-07-19 | DRG: 462 | Disposition: A | Discharge status: Home Health | Payer: BC Managed Care – PPO | Source: Intra-hospital | Attending: Physical Medicine & Rehabilitation | Admitting: Physical Medicine & Rehabilitation

## 2013-07-01 DIAGNOSIS — I619 Nontraumatic intracerebral hemorrhage, unspecified: Secondary | ICD-10-CM | POA: Diagnosis present

## 2013-07-01 DIAGNOSIS — R29898 Other symptoms and signs involving the musculoskeletal system: Secondary | ICD-10-CM | POA: Diagnosis present

## 2013-07-01 DIAGNOSIS — I10 Essential (primary) hypertension: Secondary | ICD-10-CM

## 2013-07-01 DIAGNOSIS — Z5189 Encounter for other specified aftercare: Principal | ICD-10-CM

## 2013-07-01 DIAGNOSIS — J069 Acute upper respiratory infection, unspecified: Secondary | ICD-10-CM | POA: Diagnosis present

## 2013-07-01 DIAGNOSIS — I629 Nontraumatic intracranial hemorrhage, unspecified: Secondary | ICD-10-CM

## 2013-07-01 DIAGNOSIS — E876 Hypokalemia: Secondary | ICD-10-CM

## 2013-07-01 DIAGNOSIS — G8190 Hemiplegia, unspecified affecting unspecified side: Secondary | ICD-10-CM | POA: Diagnosis present

## 2013-07-01 MED ORDER — CLONIDINE HCL 0.1 MG PO TABS
0.1000 mg | ORAL_TABLET | ORAL | Status: DC | PRN
  Administered 2013-07-02: 0.1 mg via ORAL
  Filled 2013-07-01: qty 1

## 2013-07-01 MED ORDER — SENNOSIDES-DOCUSATE SODIUM 8.6-50 MG PO TABS
1.0000 | ORAL_TABLET | Freq: Two times a day (BID) | ORAL | Status: DC
Start: 2013-07-01 — End: 2013-07-19
  Administered 2013-07-01 – 2013-07-19 (×35): 1 via ORAL
  Filled 2013-07-01 (×32): qty 1

## 2013-07-01 MED ORDER — METOPROLOL TARTRATE 50 MG PO TABS
50.0000 mg | ORAL_TABLET | Freq: Two times a day (BID) | ORAL | Status: DC
  Administered 2013-07-01 – 2013-07-02 (×2): 50 mg via ORAL
  Filled 2013-07-01 (×4): qty 1

## 2013-07-01 MED ORDER — CLONIDINE HCL 0.1 MG PO TABS
0.1000 mg | ORAL_TABLET | Freq: Two times a day (BID) | ORAL | Status: DC
  Administered 2013-07-01: 0.1 mg via ORAL
  Filled 2013-07-01 (×2): qty 1

## 2013-07-01 MED ORDER — AMLODIPINE BESYLATE 10 MG PO TABS
10.0000 mg | ORAL_TABLET | Freq: Every day | ORAL | Status: DC
  Administered 2013-07-02: 10 mg via ORAL
  Filled 2013-07-01 (×2): qty 1

## 2013-07-01 MED ORDER — PANTOPRAZOLE SODIUM 40 MG PO TBEC
40.0000 mg | DELAYED_RELEASE_TABLET | Freq: Every day | ORAL | Status: DC
  Administered 2013-07-02 – 2013-07-19 (×18): 40 mg via ORAL
  Filled 2013-07-01 (×17): qty 1

## 2013-07-01 MED ORDER — ALUM & MAG HYDROXIDE-SIMETH 200-200-20 MG/5ML PO SUSP: 30.0000 mL | ORAL | Status: DC | PRN

## 2013-07-01 MED ORDER — PROCHLORPERAZINE 25 MG RE SUPP
12.5000 mg | Freq: Four times a day (QID) | RECTAL | Status: DC | PRN
  Filled 2013-07-01: qty 1

## 2013-07-01 MED ORDER — HYDROCODONE-ACETAMINOPHEN 5-325 MG PO TABS: 1.0000 | ORAL_TABLET | ORAL | Status: DC | PRN

## 2013-07-01 MED ORDER — ACETAMINOPHEN 325 MG PO TABS
325.0000 mg | ORAL_TABLET | ORAL | Status: DC | PRN
  Administered 2013-07-09 – 2013-07-10 (×2): 650 mg via ORAL
  Filled 2013-07-01 (×3): qty 2

## 2013-07-01 MED ORDER — DIPHENHYDRAMINE HCL 12.5 MG/5ML PO ELIX: 12.5000 mg | ORAL_SOLUTION | Freq: Four times a day (QID) | ORAL | Status: DC | PRN

## 2013-07-01 MED ORDER — FLEET ENEMA 7-19 GM/118ML RE ENEM: 1.0000 | ENEMA | Freq: Once | RECTAL | Status: AC | PRN

## 2013-07-01 MED ORDER — CLONIDINE HCL 0.1 MG PO TABS
0.1000 mg | ORAL_TABLET | Freq: Two times a day (BID) | ORAL | Status: DC
  Administered 2013-07-01 – 2013-07-02 (×2): 0.1 mg via ORAL
  Filled 2013-07-01 (×4): qty 1

## 2013-07-01 MED ORDER — GUAIFENESIN-DM 100-10 MG/5ML PO SYRP: 5.0000 mL | ORAL_SOLUTION | Freq: Four times a day (QID) | ORAL | Status: DC | PRN

## 2013-07-01 MED ORDER — TRAZODONE HCL 50 MG PO TABS: 25.0000 mg | ORAL_TABLET | Freq: Every evening | ORAL | Status: DC | PRN

## 2013-07-01 MED ORDER — PROCHLORPERAZINE EDISYLATE 5 MG/ML IJ SOLN
5.0000 mg | Freq: Four times a day (QID) | INTRAMUSCULAR | Status: DC | PRN
  Filled 2013-07-01: qty 2

## 2013-07-01 MED ORDER — PROCHLORPERAZINE MALEATE 5 MG PO TABS
5.0000 mg | ORAL_TABLET | Freq: Four times a day (QID) | ORAL | Status: DC | PRN
  Filled 2013-07-01: qty 2

## 2013-07-01 MED ORDER — BISACODYL 10 MG RE SUPP: 10.0000 mg | Freq: Every day | RECTAL | Status: DC | PRN

## 2013-07-01 MED ORDER — BENAZEPRIL HCL 10 MG PO TABS
10.0000 mg | ORAL_TABLET | Freq: Every day | ORAL | Status: DC
  Administered 2013-07-02: 10 mg via ORAL
  Filled 2013-07-01 (×2): qty 1

## 2013-07-01 NOTE — Progress Notes (Signed)
PT Cancellation Note  Patient Details Name: Paul Reeves MRN: [id number] DOB: 1986/03/21   Cancelled Treatment:    Reason Eval/Treat Not Completed: Per chart review, pt is to transfer to CIR this afternoon. Will defer further PT treatment to next venue of care.   Ruthann Cancer 07/01/2013, 3:08 PM  Ruthann Cancer, PT, DPT Acute Rehabilitation Services

## 2013-07-01 NOTE — Clinical Documentation Improvement (Signed)
THIS DOCUMENT IS NOT A PERMANENT PART OF THE MEDICAL RECORD  Please update your documentation with the medical record to reflect your response to this query. If you need help knowing how to do this please call [phone].  07/01/13   Dear Larita Fife,  In a better effort to capture your patient's severity of illness, reflect appropriate length of stay and utilization of resources, a review of the patient medical record has revealed the following indicators.   Based on your clinical judgment, please clarify and document in a progress note and/or discharge summary the clinical condition associated with the following supporting information: In responding to this query please exercise your independent judgment.  The fact that a query is asked, does not imply that any particular answer is desired or expected.    Hello Larita Fife!  Abnormal findings (laboratory, x-ray, MRI/CT scans, and other diagnostic results) are not coded and reported unless the physician indicates their clinical significance. The medical record reflects the following clinical findings. If possible, please help by clarifying any diagnostic and/or clinical significance of these abnormal findings. Thank you!  CT HEAD 06/26/13 FINDINGS:A right basal ganglia hemorrhage is similar in size and configuration to the prior exam. Mild surrounding edema is more evident than on the prior study, as expected. There is some focal mass effect. No significant midline shift is present. There is partial effacement of the atrium of the right lateral ventricle.   CT HEAD 06/26/13 FINDINGS:Examination continues to demonstrate patient's acute parenchymal hemorrhage over the right basal ganglia without significant change in size/morphology measuring 3.0 x 1.9 cm there is mild immediately adjacent edema slightly more prominent with minimal local mass effect. No significant midline shift. No additional foci of acute hemorrhage noted. Remainder of the exam is unchanged.    Possible Clinical Conditions?  - Cerebral Edema  - Other condition (please document in the progress notes and/or discharge summary)  - Cannot Clinically determine at this time      Reviewed:  no additional documentation provided  Thank You,  Saul Fordyce  Clinical Documentation Specialist: [phone] Health Information Management [place]

## 2013-07-01 NOTE — H&P (Signed)
Physical Medicine and Rehabilitation Admission H&P  Chief Complaint   Patient presents with   .  Left sided weakness   HPI: Paul Reeves is a 27 y.o. male with history of HTN who was found on the bathroom floor by family with left sided weakness, difficulty talking and inability to move on 06/26/13. Blood pressure SBP-250 per EMS reports. 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 UDS negative. Treated with IV labetalol and started on cardene drip for BP control. Swallow evaluation without dysphagia and regular diet recommended. Blood pressures remain poorly controlled with headaches being a limiting factor. Renal artery duplex negative for stenosis. 24 hours urine ordered to rule out pheochromocytoma. Gait not attempted due to elevated BP as well as complaints of . Therapy team recommended CIR. Pt had persistent headaches over the last few days which have improved somewhat. Pt ultimately was admitted to CIR today   Review of Systems  Constitutional: Positive for malaise/fatigue.  HENT: Negative for hearing loss and neck pain.  Eyes: Negative for blurred vision and double vision.  Respiratory: Negative for cough and shortness of breath.  Cardiovascular: Negative for chest pain and palpitations.  Gastrointestinal: Negative for heartburn, nausea, abdominal pain and constipation.  Genitourinary: Negative for urgency and frequency.  Musculoskeletal: Negative for myalgias and back pain.  Neurological: Positive for dizziness, sensory change, speech change and focal weakness. Negative for headaches.  Psychiatric/Behavioral: Negative for depression. The patient does not have insomnia.   Past Medical History   Diagnosis  Date   .  Hypertension  06/26/2013     new dx   .  Seasonal allergies      in summer and spring.    History reviewed. No pertinent past surgical history.  Family History   Problem  Relation  Age of Onset   .  Hypertension  Father     .  Diabetes  Maternal Grandmother     Social History: Lives with mother. Works at a call center and independent PTA. He reports that he has never smoked. He does not have any smokeless tobacco history on file. He reports that he does not use illicit drugs. His alcohol history is not on file  Allergies   Allergen  Reactions   .  Peanuts [Peanut Oil]  Anaphylaxis   .  Naproxen  Rash    No prescriptions prior to admission    Home:  Home Living  Family/patient expects to be discharged to:: Private residence  Living Arrangements: Parent  Available Help at Discharge: Family;Available 24 hours/day  Type of Home: House  Home Access: Stairs to enter  [employer] of Steps: 1  Home Layout: One level  Home Equipment: None  Functional History:  Prior Function  Comments: pt working at call center  Functional Status:  Mobility:  Bed Mobility  Bed Mobility: Supine to Sit;Sit to Supine;Rolling Right  Rolling Right: With rail;4: Min assist  Rolling Left: 3: Mod assist;With rail  Supine to Sit: With rails;HOB flat  Sitting - Scoot to Edge of Bed: 4: Min assist;With rail  Sit to Supine: 4: Min assist  Transfers  Transfers: Stand to Sit;Sit to Stand;Stand Pivot Transfers  Sit to Stand: 1: +2 Total assist;With upper extremity assist;With armrests;From bed  Sit to Stand: Patient Percentage: 70%  Stand to Sit: 1: +2 Total assist;Without upper extremity assist;With armrests;To chair/3-in-1  Stand to Sit: Patient Percentage: 70%  Stand Pivot Transfers: 1: +2 Total assist;With armrests  Stand Pivot Transfers: Patient Percentage: 60%  Ambulation/Gait  Ambulation/Gait Assistance: Not tested (comment) (pt not feeling up to trying walking today due to dizziness)   ADL:  ADL  Grooming: Moderate assistance  Where Assessed - Grooming: Supported sitting  Upper Body Bathing: Moderate assistance  Where Assessed - Upper Body Bathing: Supported sitting  Lower Body Bathing: Maximal assistance   Where Assessed - Lower Body Bathing: Lean right and/or left  Upper Body Dressing: Maximal assistance  Where Assessed - Upper Body Dressing: Supported sitting  Lower Body Dressing: Maximal assistance  Where Assessed - Lower Body Dressing: Lean right and/or left;Supported sitting  Toilet Transfer: +1 Total assistance  Equipment Used: Gait belt  Transfers/Ambulation Related to ADLs: Total A at this time. began feeling nauseated  Cognition:  Cognition  Overall Cognitive Status: Impaired/Different from baseline  Orientation Level: Oriented X4  Cognition  Arousal/Alertness: Lethargic  Behavior During Therapy: WFL for tasks assessed/performed  Overall Cognitive Status: Impaired/Different from baseline  Area of Impairment: Attention;Awareness;Problem solving  Current Attention Level: Sustained  Awareness: Emergent  Problem Solving: Slow processing;Requires tactile cues;Requires verbal cues  General Comments: Pt impulsive, quick to move, eyes closed much of session due to dizziness. Lethargic due to therapist awoke him from sleeping. Seems as though he is sleeping most of the day.    Physical Exam:  Blood pressure 190/137, pulse 87, temperature 98.5 F (36.9 C), temperature source Oral, resp. rate 18, height 6' (1.829 m), weight 101.9 kg (224 lb 10.4 oz), SpO2 100.00%.  Nursing note and vitals reviewed.  Constitutional: He is oriented to person, place, and time. He appears well-developed and well-nourished.  HENT:  Head: Normocephalic and atraumatic.  Eyes: Conjunctivae are normal. Pupils are equal, round, and reactive to light.  Neck: Normal range of motion. Neck supple.  Cardiovascular: Normal rate and regular rhythm. No murmur Pulmonary/Chest: Effort normal and breath sounds normal. No respiratory distress. He has no wheezes.  Abdominal: Soft. Bowel sounds are normal. He exhibits no distension. There is tenderness.  Musculoskeletal: He exhibits no edema and no tenderness.   Neurological: He is alert and oriented to person, place, and time.  Speech clear. Left facial weakness. Left hemiparesis with sensory deficits. LUE is 2+/3- deltoid, 3-/5 bic, tric, HI. LLE is 3-/5 HF, KE; ADF tr, APF 1. Sensation 1/2 left arm and leg. Mild left facial weakness, visual fields intact.   Cognitively appropriate.  Skin: Skin is warm and dry.  Psychiatric: He has a normal mood and affect. His behavior is normal. Judgment and thought content normal.    Results for orders placed during the hospital encounter of 06/26/13 (from the past 48 hour(s))   BASIC METABOLIC PANEL Status: Abnormal    Collection Time    06/30/13 6:00 AM   Result  Value  Range    Sodium  140  135 - 145 mEq/L    Potassium  3.6  3.5 - 5.1 mEq/L    Chloride  108  96 - 112 mEq/L    CO2  22  19 - 32 mEq/L    Glucose, Bld  92  70 - 99 mg/dL    BUN  14  6 - 23 mg/dL    Creatinine, Ser  2.84  0.50 - 1.35 mg/dL    Calcium  8.7  8.4 - 10.5 mg/dL    GFR calc non Af Amer  72 (*)  >90 mL/min    GFR calc Af Amer  84 (*)  >90 mL/min  Comment:  (NOTE)     The eGFR has been calculated using the CKD EPI equation.     This calculation has not been validated in all clinical situations.     eGFR's persistently <90 mL/min signify possible Chronic Kidney     Disease.    No results found.  Post Admission Physician Evaluation:  1. Functional deficits secondary to right basal ganglia/sub insular IPH. 2. Patient is admitted to receive collaborative, interdisciplinary care between the physiatrist, rehab nursing staff, and therapy team. 3. Patient's level of medical complexity and substantial therapy needs in context of that medical necessity cannot be provided at a lesser intensity of care such as a SNF. 4. Patient has experienced substantial functional loss from his/her baseline which was documented above under the "Functional History" and "Functional Status" headings. Judging by the patient's diagnosis, physical exam, and  functional history, the patient has potential for functional progress which will result in measurable gains while on inpatient rehab. These gains will be of substantial and practical use upon discharge in facilitating mobility and self-care at the household level. 5. Physiatrist will provide 24 hour management of medical needs as well as oversight of the therapy plan/treatment and provide guidance as appropriate regarding the interaction of the two. 6. 24 hour rehab nursing will assist with bladder management, bowel management, safety, skin/wound care, disease management, medication administration, pain management and patient education and help integrate therapy concepts, techniques,education, etc. 7. PT will assess and treat for/with: Lower extremity strength, range of motion, stamina, balance, functional mobility, safety, adaptive techniques and equipment, NMR, pain mgt, education. Goals are: supervision to mod I. 8. OT will assess and treat for/with: ADL's, functional mobility, safety, upper extremity strength, adaptive techniques and equipment, NMR, pain mgt, education. Goals are: supervision to mod I. 9. SLP will assess and treat for/with: swallowing, speech. Goals are: mod I. 10. Case Management and Social Worker will assess and treat for psychological issues and discharge planning. 11. Team conference will be held weekly to assess progress toward goals and to determine barriers to discharge. 12. Patient will receive at least 3 hours of therapy per day at least 5 days per week. 13. ELOS: 2 to 2.5 weeks  14. Prognosis: excellent   Medical Problem List and Plan:  1. DVT Prophylaxis/Anticoagulation: Mechanical: Antiembolism stockings, knee (TED hose) Bilateral lower extremities  Sequential compression devices, below knee Bilateral lower extremities  2. Pain Management: Will add prn vicodin for headaches. Monitor for now.  3. Mood: Will have LCSW follow for evaluation. Poor insight with impaired  awareness at this time. Will monitor.  4. Neuropsych: This patient is capable of making decisions on his own behalf.  5. Malignant HTN: Will monitor every four hours and titrate medications as indicated.    Ranelle Oyster, MD, [facility] Physical Medicine & Rehabilitation   07/01/2013

## 2013-07-01 NOTE — Progress Notes (Addendum)
Stroke Discharge Summary  Patient ID: Paul Reeves   MRN: [id number]      DOB: 1986/05/23  Date of Admission: 06/26/2013 Date of Discharge: 07/01/2013  Attending Physician:  Darcella Cheshire, MD, Stroke MD  Consulting Physician(s):  Treatment Team:  Md Stroke, MD None and rehabilitation medicine  Patient's PCP:  No primary provider on file.  Discharge Diagnoses:  Principal Problem:   Intracranial hemorrhage Active Problems:   Malignant hypertension   Hypokalemia BMI  Body mass index is 30.46 kg/(m^2).  Past Medical History  Diagnosis Date  . Hypertension 06/26/2013    new dx  . Seasonal allergies     in summer and spring.   History reviewed. No pertinent past surgical history.  Medications to be continued on Rehab . amLODipine  10 mg Oral Daily  . benazepril  10 mg Oral Daily  . cloNIDine  0.1 mg Oral BID  . metoprolol tartrate  50 mg Oral BID  . pantoprazole  40 mg Oral Daily  . senna-docusate  1 tablet Oral BID    LABORATORY STUDIES CBC    Component Value Date/Time   WBC 12.0* 06/26/2013 0140   RBC 5.14 06/26/2013 0140   HGB 16.3 06/26/2013 0140   HCT 44.1 06/26/2013 0140   PLT 266 06/26/2013 0140   MCV 85.8 06/26/2013 0140   MCH 31.7 06/26/2013 0140   MCHC 37.0* 06/26/2013 0140   RDW 12.3 06/26/2013 0140   LYMPHSABS 5.5* 06/26/2013 0140   MONOABS 1.0 06/26/2013 0140   EOSABS 0.3 06/26/2013 0140   BASOSABS 0.1 06/26/2013 0140   CMP    Component Value Date/Time   NA 140 06/30/2013 0600   K 3.6 06/30/2013 0600   CL 108 06/30/2013 0600   CO2 22 06/30/2013 0600   GLUCOSE 92 06/30/2013 0600   BUN 14 06/30/2013 0600   CREATININE 1.32 06/30/2013 0600   CALCIUM 8.7 06/30/2013 0600   PROT 7.4 06/26/2013 0140   ALBUMIN 3.6 06/26/2013 0140   AST 24 06/26/2013 0140   ALT 21 06/26/2013 0140   ALKPHOS 64 06/26/2013 0140   BILITOT 0.5 06/26/2013 0140   GFRNONAA 72* 06/30/2013 0600   GFRAA 84* 06/30/2013 0600   COAGS Lab Results  Component Value Date   INR 0.98 06/26/2013    Lipid Panel    Component Value Date/Time   CHOL 132 06/27/2013 0418   TRIG 68 06/27/2013 0418   HDL 42 06/27/2013 0418   CHOLHDL 3.1 06/27/2013 0418   VLDL 14 06/27/2013 0418   LDLCALC 76 06/27/2013 0418   HgbA1C  Lab Results  Component Value Date   HGBA1C 5.1 06/26/2013   Cardiac Panel (last 3 results) No results found for this basename: CKTOTAL, CKMB, TROPONINI, RELINDX,  in the last 72 hours Urinalysis No results found for this basename: colorurine,  appearanceur,  labspec,  phurine,  glucoseu,  hgbur,  bilirubinur,  ketonesur,  proteinur,  urobilinogen,  nitrite,  leukocytesur   Urine Drug Screen     Component Value Date/Time   LABOPIA NONE DETECTED 06/26/2013 0927   COCAINSCRNUR NONE DETECTED 06/26/2013 0927   LABBENZ NONE DETECTED 06/26/2013 0927   AMPHETMU NONE DETECTED 06/26/2013 0927   THCU NONE DETECTED 06/26/2013 0927   LABBARB NONE DETECTED 06/26/2013 1478    Alcohol Level No results found for this basename: eth     SIGNIFICANT DIAGNOSTIC STUDIES  Ct Head (brain) Wo Contrast  06/26/2013  3.2 x 1.9 cm right basal ganglia/sub insular acute intraparenchymal hematoma  with local mass effect, no midline shift.  Head CT - Repeat  06/29/2013  Stable acute parenchymal hemorrhage over the posterior right basal  ganglia measuring 1.9 x 3.0 cm in transverse and AP dimensions with  slightly more adjacent edema. No additional foci of acute  hemorrhage.  MRI of the brain Patient unable to tolerate  MRA of the brain  2D Echocardiogram EF 55%, wall motion normal, left atrium mildly dilated. No ASD or PFO identified.  Carotid Doppler-- Bilateral: 1-39% internal carotid artery stenosis. Vertebral artery flow is antegrade.  Renal ultrasound--No evidence of renal artery stenosis bilaterally   History of Present Illness    Paul Reeves is a 27 y.o. male without known past medical history, brought to [facility] ED 06/26/2013, as a code stroke by EMS after being found lying in the bathroom  by family.   He was last seen normal at 0045 that day. Stated that he collapsed across the bathroom, remained conscious all the time, but couldn't get up from the floor and started calling for help. His mother said that he was struggling trying to move the left side floor.   When EMS arrived to the scene he was alert and awake, unable to move the left side, some speech impairment, and SBP 250.   Initial NIHSS 12.   CT brain reveled a 3.2 x 1.9 cm right basal ganglia/sub insular acute intraparenchymal hematoma with local mass effect, no midline shift, no intraventricular extension.   On arrival he complained of HA and nausea, but denies vertigo, double vision, difficulty swallowing, confusion, or visual disturbances.   Received 20 mg IV labetalol while awaiting for cardene drip   Date last known well: 10/1//14  Time last known well: 12:45am  tPA Given: no, ICH  NIHSS: 12  MRS: 3    Hospital Course  [name] is a 27 y.o. male presenting with left hemiparesis, dysarthria, headache, nausea. Imaging confirms a 3.2 x 1.9 cm right basal ganglia/sub insular acute intraparenchymal hematoma with local mass effect, no midline shift. 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 On no antithrombotics prior to admission. Now on no antithrombotics for secondary stroke prevention. Patient with resultant left hemiparesis, headache.  Malignant hypertension  ICH with mild cerebral edma Hypokalemia - treated LDL 76, at goal, no statin indicated.  HGBA1C , 5.1  Acute dehydration, treated with IV fluids. Hospital day # 5   TREATMENT/PLAN  Continue no antithrombotics for secondary stroke prevention due to Hemorrhage  Strict control of HT - medications adjusted. Renal ultrasound did not show evidence of renal artery stenosis. Patient is now collecting 24 hour urine to test for pheochromocytoma.  Patient with recent dehydration. Will avoid adding diuretic. Add clonidine. Monitor  renal function while on ACE.  Patient claustrophobic of MRI, even with medications for relaxation.  CIR recommended. Ok to transfer to rehab.  Will add clonidine for blood pressure. Patient has had elevated blood pressure for over 5 years untreated. Mom confirms that his blood pressure has been "watched" by previous physicians but never on anything more than samples. Strong family history of hard to treat blood pressures. Renal artery stenosis study negative. Now will do 24 hour urine study for pheochromocytoma   Patient with vascular risk factors of:   Hypertension, uncontrolled  Patient has resultant left hemiparesis. Physical therapy, occupational therapy and speech therapy evaluated patient. All agreed inpatient rehab is needed. Patient's family is/are supportive and can provide care at discharge. CIR bed is available today and  patient will be transferred there.  Discharge Exam  Blood pressure 190/137, pulse 87, temperature 98.5 F (36.9 C), temperature source Oral, resp. rate 18, height 6' (1.829 m), weight 101.9 kg (224 lb 10.4 oz), SpO2 100.00%.   Mental Status:  Alert, awake, oriented x 4, thought content appropriate. Mild dysarthria without evidence of aphasia. Able to follow 3 step commands without difficulty.  Cranial Nerves:  II: Discs flat bilaterally; Visual fields grossly normal, pupils equal, round, reactive to light and accommodation  III,IV, VI: ptosis not present, extra-ocular motions intact bilaterally  V:facial light touch sensation normal bilaterally  VII: mild Lower left face weakness.  VIII: hearing normal bilaterally  IX,X: gag reflex present  XI: bilateral shoulder shrug  XII: midline tongue extension  Motor:  Significant for mild left hemiplegia 4/5 strength with drift  Tone and bulk:normal bulk throughout, decreased tone left side; no atrophy noted  Sensory: Pinprick and light touch impaired in the left side.  Deep Tendon Reflexes:  Right: Upper Extremity  Left: Upper extremity  biceps (C-5 to C-6) 2/4 biceps (C-5 to C-6) 2/4  tricep (C7) 2/4 triceps (C7) 2/4  Brachioradialis (C6) 2/4 Brachioradialis (C6) 2/4  Lower Extremity Lower Extremity  quadriceps (L-2 to L-4) 2/4 quadriceps (L-2 to L-4) 2/4  Achilles (S1) 2/4 Achilles (S1) 2/4  Plantars:  Right: downgoing Left: downgoing  Cerebellar:  normal finger-to-nose, normal heel-to-shin test in the left. Couldn't be tested in the left due to hemiplegia.  Gait:  Unable to test.  CV: pulses palpable throughout   Discharge Diet  Cardiac thin liquids  Discharge Plan  Disposition:  Transfer to [facility] for ongoing PT, OT and ST  no antithrombotics for secondary stroke prevention.  Ongoing risk factor control by Primary Care Physician. Risk factor recommendations:  Hypertension target range 130-140/70-80 Lipid range - LDL < 100 and checked every 6 months, fasting   Follow-up primary physician in 2 weeks after discharge.  Follow-up with Dr. Delia Heady, Stroke Clinic in 1 month after discharge.  35 minutes were spent preparing discharge.  Signed  Gwendolyn Lima. Manson Passey, [facility], MBA, MHA [facility] Pager: [phone] 07/01/2013 3:02 PM  I have personally examined this patient, reviewed pertinent data and developed the plan of care. I agree with above. Delia Heady, MD

## 2013-07-01 NOTE — PMR Pre-admission (Signed)
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 Date: 04/26/13     Deduct: $1000      Out of Pocket Max: $1500      Life Max: unlimited CIR: $750 per admit      SNF: 100%   60 days max Outpatient: 60 visits combined     Co-Pay: $30/visit Home Health: 100% after ded. 60 visits max     Co-Pay: none DME: 100% after ded.     Co-Pay: none Providers: in network  Emergency Contact Information Contact Information   Name Relation Home Work Mobile   Johnson,Kim Mother [phone]     Drozdowski,Michael Father   [phone]     Current Medical History  Patient Admitting Diagnosis:  R BG, subinsular ICH  History of Present Illness: A 27 y.o. male with history of HTN who was found on the bathroom floor by family with left sided weakness, difficulty talking and inability to move on 06/26/13. Blood pressure SBP-250 per EMS reports. 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 UDS negative. Treated with IV labetalol and started on cardene drip for BP control. Swallow evaluation without dysphagia and regular diet recommended. PT evaluation done.  MD, PT recommending CIR.  BP has been elevated and MD actively treating malignant hypertension.  Will start clonidine 0.1 mg BID today.  Patient currently sitting up in chair and very interactive today.    Total: 4=NIH  Past Medical History  Past Medical History   Diagnosis Date  . Hypertension 06/26/2013    new dx  . Seasonal allergies     in summer and spring.    Family History  family history includes Diabetes in his maternal grandmother; Hypertension in his father.  Prior Rehab/Hospitalizations:  No previous rehab.   Current Medications  Current facility-administered medications:0.9 %  sodium chloride infusion, , Intravenous, Continuous, Cathlyn Parsons, PA-C, Last Rate: 75 mL/hr at 06/29/13 2354;  acetaminophen (TYLENOL) suppository 650 mg, 650 mg, Rectal, Q4H PRN, Wyatt Portela, MD;  acetaminophen (TYLENOL) tablet 650 mg, 650 mg, Oral, Q4H PRN, Wyatt Portela, MD, 650 mg at 06/28/13 0845 amLODipine (NORVASC) tablet 10 mg, 10 mg, Oral, Daily, Cathlyn Parsons, PA-C, 10 mg at 07/01/13 1002;  benazepril (LOTENSIN) tablet 10 mg, 10 mg, Oral, Daily, David L Rinehuls, PA-C, 10 mg at 07/01/13 1002;  cloNIDine (CATAPRES) tablet 0.1 mg, 0.1 mg, Oral, BID, Neema Davina Poke, MD;  labetalol (NORMODYNE,TRANDATE) injection 5 mg, 5 mg, Intravenous, Q2H PRN, Kinnie Scales Rinehuls, PA-C, 5 mg at 07/01/13 0438 metoprolol (LOPRESSOR) tablet 50 mg, 50 mg, Oral, BID, David L Rinehuls, PA-C, 50 mg at 07/01/13 1002;  ondansetron (  ZOFRAN) injection 4 mg, 4 mg, Intravenous, Q6H PRN, Noel Christmas, 4 mg at 06/28/13 2237;  pantoprazole (PROTONIX) EC tablet 40 mg, 40 mg, Oral, Daily, [facility], RPH, 40 mg at 07/01/13 1002;  senna-docusate (Senokot-S) tablet 1 tablet, 1 tablet, Oral, BID, Wyatt Portela, MD, 1 tablet at 07/01/13 1002  Patients Current Diet: Cardiac  Precautions / Restrictions Precautions Precautions: Fall Precaution Comments: left hemiperesis, dizziness/nausea in sitting Restrictions Weight Bearing Restrictions: No   Prior Activity Level Community (5-7x/wk): Worked FT as a Orthoptist.  Home Assistive Devices / Equipment Home Assistive Devices/Equipment: None Home Equipment: None  Prior Functional Level Prior  Function Level of Independence: Independent Comments: pt working at call center  Current Functional Level Cognition  Overall Cognitive Status: Impaired/Different from baseline Current Attention Level: Sustained Orientation Level: Oriented X4 General Comments: Pt impulsive, quick to move, eyes closed much of session due to dizziness.  Lethargic due to therapist awoke him from sleeping.  Seems as though he is sleeping most of the day.      Extremity Assessment (includes Sensation/Coordination)          ADLs  Grooming: Moderate assistance Where Assessed - Grooming: Supported sitting Upper Body Bathing: Moderate assistance Where Assessed - Upper Body Bathing: Supported sitting Lower Body Bathing: Maximal assistance Where Assessed - Lower Body Bathing: Lean right and/or left Upper Body Dressing: Maximal assistance Where Assessed - Upper Body Dressing: Supported sitting Lower Body Dressing: Maximal assistance Where Assessed - Lower Body Dressing: Lean right and/or left;Supported sitting Toilet Transfer: +1 Total assistance Toileting - Clothing Manipulation and Hygiene: Maximal assistance Equipment Used: Gait belt Transfers/Ambulation Related to ADLs: Total A at this time. began feeling nauseated    Mobility  Bed Mobility: Supine to Sit;Sit to Supine;Rolling Right Rolling Right: With rail;4: Min assist Rolling Left: 3: Mod assist;With rail Supine to Sit: With rails;HOB flat Sitting - Scoot to Edge of Bed: 4: Min assist;With rail Sit to Supine: 4: Min assist    Transfers  Transfers: Stand to Sit;Sit to Stand;Stand Pivot Transfers Sit to Stand: 1: +2 Total assist;With upper extremity assist;With armrests;From bed Sit to Stand: Patient Percentage: 70% Stand to Sit: 1: +2 Total assist;Without upper extremity assist;With armrests;To chair/3-in-1 Stand to Sit: Patient Percentage: 70% Stand Pivot Transfers: 1: +2 Total assist;With armrests Stand Pivot Transfers: Patient Percentage:  60%    Ambulation / Gait / Stairs / Wheelchair Mobility  Ambulation/Gait Ambulation/Gait Assistance: Not tested (comment) (pt not feeling up to trying walking today due to dizziness)    Posture / Balance Static Sitting Balance Static Sitting - Balance Support: Right upper extremity supported;No upper extremity supported;Feet supported Static Sitting - Level of Assistance: 4: Min assist Static Sitting - Comment/# of Minutes: 5 Dynamic Sitting Balance Dynamic Sitting - Balance Support: No upper extremity supported;Feet supported Dynamic Sitting - Level of Assistance: 3: Mod assist Dynamic Sitting - Comments: LOB left and posteriorly.  Seated EOB worked on balancing with right hand, then with no hands, scooting, trying to use a target to decrease spinning/dizziness.   Static Standing Balance Static Standing - Balance Support: Right upper extremity supported Static Standing - Level of Assistance: 1: +2 Total assist;Patient percentage (comment) (pt 70%) Dynamic Standing Balance Dynamic Standing - Balance Support: Right upper extremity supported Dynamic Standing - Level of Assistance: 1: +2 Total assist;Patient percentage (comment) (pt 60%)    Special needs/care consideration BiPAP/CPAP No CPM No Continuous Drip IV 0/9% NS 75 ml/hr Dialysis No  Life Vest No Oxygen No Special Bed  Trach Size No Wound Vac (area) No     Skin No                             Bowel mgmt: Last BM 06/30/13 Bladder mgmt: Voiding WDL Diabetic mgmt No    Previous Home Environment Living Arrangements: Parent Available Help at Discharge: Family;Available 24 hours/day Type of Home: House Home Layout: One level Home Access: Stairs to enter [employer] of Steps: 1 Home Care Services: No  Discharge Living Setting Plans for Discharge Living Setting: House;Lives with (comment) (Lives with mom and 97 yo brother.) Type of Home at Discharge: House Discharge Home Layout: One level Discharge Home  Access: Stairs to enter Entrance Stairs-Number of Steps: 1 Does the patient have any problems obtaining your medications?: No  Social/Family/Support Systems Contact Information: Kathlene November - mom and Casimiro Needle [place] - dad Anticipated Caregiver: mom - Selena Batten and sister Anticipated Caregiver's Contact Information: Selena Batten [phone] - mom Ability/Limitations of Caregiver: Mom works days.  Sister works 3rd shift and may be able to stay with patient early mornings Caregiver Availability: Other (Comment) (Mom in evenings and ? sister in the mornings.) Discharge Plan Discussed with Primary Caregiver: Yes Is Caregiver In Agreement with Plan?: Yes Does Caregiver/Family have Issues with Lodging/Transportation while Pt is in Rehab?: No  Goals/Additional Needs Patient/Family Goal for Rehab: PT mod I, OT mod I/S goals Expected length of stay: 2 weeks Cultural Considerations: None Dietary Needs: Heart diet with thin liquids Equipment Needs: TBD Pt/Family Agrees to Admission and willing to participate: Yes Program Orientation Provided & Reviewed with Pt/Caregiver Including Roles  & Responsibilities: Yes   Decrease burden of Care through IP rehab admission: Not applicable  Possible need for SNF placement upon discharge: Not likely.   Patient Condition: This patient's medical and functional status has changed since the consult dated: 06/27/13 in which the Rehabilitation Physician determined and documented that the patient's condition is appropriate for intensive rehabilitative care in an inpatient rehabilitation facility. See "History of Present Illness" (above) for medical update. Functional changes QIO:[id number] total assist +2 60% 2-3 pivotal steps. Patient's medical and functional status update has been discussed with the Rehabilitation physician and patient remains appropriate for inpatient rehabilitation. Will admit to inpatient rehab today.  Preadmission Screen Completed By:  Trish Mage,  07/01/2013 12:11 PM ______________________________________________________________________   Discussed status with Dr. Riley Kill on 07/01/13 at 1214 and received telephone approval for admission today.  Admission Coordinator:  Trish Mage, time1214/Date10/06/14

## 2013-07-01 NOTE — Progress Notes (Signed)
SLP Cancellation Note  Patient Details Name: Paul Reeves MRN: [id number] DOB: Apr 15, 1986   Cancelled treatment:  Per chart review, note that patient is scheduled to be admitted to inpatient rehab today, and will therefore be evaluated by therapists 10/7. Given the above, will defer speech/language evaluation to this next level of care.   Maxcine Ham 07/01/2013, 2:34 PM  Maxcine Ham, M.A. CCC-SLP [phone]

## 2013-07-01 NOTE — Progress Notes (Signed)
Pt discharged to Rehab. Called and reported off to [facility] in [place]. Rodell Perna Tivon Lemoine RN

## 2013-07-01 NOTE — Progress Notes (Signed)
Stroke Team Progress Note  HISTORY  Paul Reeves is a 27 y.o. male without known past medical history, brought to [facility] ED 06/26/2013, as a code stroke by EMS after being found lying in the bathroom by family.   He was last seen normal at 0045 that day. Stated that he collapsed across the bathroom, remained conscious all the time, but couldn't get up from the floor and started calling for help. His mother said that he was struggling trying to move the left side floor.  When EMS arrived to the scene he was alert and awake, unable to move the left side, some speech impairment, and SBP 250.   Initial NIHSS 12.   CT brain reveled a 3.2 x 1.9 cm right basal ganglia/sub insular acute intraparenchymal hematoma with local mass effect, no midline shift, no intraventricular extension.   On arrival he complained of HA and nausea, but denies vertigo, double vision, difficulty swallowing, confusion, or visual disturbances.   Received 20 mg IV labetalol while awaiting for cardene drip   Date last known well: 10/1//14  Time last known well: 12:45am  tPA Given: no, ICH  NIHSS: 12  MRS: 3    Patient was not a TPA candidate secondary to hemorrhage. He was admitted to the neuro ICU for further evaluation and treatment.  SUBJECTIVE Patient lying in bed. Says he feels better. Bathing. Mother in room. Blood pressure remains elevated. Mother says blood pressure has been elevated for years.  OBJECTIVE Most recent Vital Signs: Filed Vitals:   07/01/13 0128 07/01/13 0440 07/01/13 0541 07/01/13 0900  BP: 153/107 157/115 160/104 166/118  Pulse: 74 78 72 87  Temp: 98.3 F (36.8 C) 98 F (36.7 C) 98.5 F (36.9 C) 98.5 F (36.9 C)  TempSrc: Oral Oral Oral Oral  Resp: 18 18 18    Height:      Weight:      SpO2: 98% 97% 97% 100%   CBG (last 3)  No results found for this basename: GLUCAP,  in the last 72 hours  IV Fluid Intake:   . sodium chloride 75 mL/hr at 06/29/13 2354    MEDICATIONS  .  amLODipine  10 mg Oral Daily  . benazepril  10 mg Oral Daily  . metoprolol tartrate  50 mg Oral BID  . pantoprazole  40 mg Oral Daily  . senna-docusate  1 tablet Oral BID   PRN:  acetaminophen, acetaminophen, labetalol, ondansetron (ZOFRAN) IV  Diet:  Cardiac   Activity:  ambulate DVT Prophylaxis:  SCD  CLINICALLY SIGNIFICANT STUDIES Basic Metabolic Panel:   Recent Labs Lab 06/27/13 0418 06/27/13 0800  06/29/13 0550 06/30/13 0600  NA 143  --   < > 142 140  K 3.1*  --   < > 3.8 3.6  CL 107  --   < > 109 108  CO2 25  --   < > 18* 22  GLUCOSE 80  --   < > 79 92  BUN 13  --   < > 16 14  CREATININE 1.50*  --   < > 1.41* 1.32  CALCIUM 8.7  --   < > 8.6 8.7  MG  --  1.9  --   --   --   < > = values in this interval not displayed. Liver Function Tests:   Recent Labs Lab 06/26/13 0140  AST 24  ALT 21  ALKPHOS 64  BILITOT 0.5  PROT 7.4  ALBUMIN 3.6   CBC:   Recent  Labs Lab 06/26/13 0140  WBC 12.0*  NEUTROABS 5.2  HGB 16.3  HCT 44.1  MCV 85.8  PLT 266   Coagulation:   Recent Labs Lab 06/26/13 0140  LABPROT 12.8  INR 0.98   Cardiac Enzymes:   Recent Labs Lab 06/26/13 0140  TROPONINI <0.30   Urinalysis: No results found for this basename: COLORURINE, APPERANCEUR, LABSPEC, PHURINE, GLUCOSEU, HGBUR, BILIRUBINUR, KETONESUR, PROTEINUR, UROBILINOGEN, NITRITE, LEUKOCYTESUR,  in the last 168 hours Lipid Panel    Component Value Date/Time   CHOL 132 06/27/2013 0418   LDL 76  MG  1.9  HgbA1C  Lab Results  Component Value Date   HGBA1C 5.1 06/26/2013    Urine Drug Screen:      Component Value Date/Time   LABOPIA NONE DETECTED 06/26/2013 1610    Alcohol Level: No results found for this basename: ETH,  in the last 168 hours  Ct Head (brain) Wo Contrast 06/26/2013    3.2 x 1.9 cm right basal ganglia/sub insular acute intraparenchymal hematoma with local mass effect, no midline shift.    Head CT - Repeat 06/29/2013 Stable acute parenchymal hemorrhage  over the posterior right basal  ganglia measuring 1.9 x 3.0 cm in transverse and AP dimensions with  slightly more adjacent edema. No additional foci of acute  hemorrhage.   MRI of the brain  Patient unable to tolerate MRA of the brain    2D Echocardiogram  EF 55%, wall motion normal, left atrium mildly dilated. No ASD or PFO identified.  Carotid Doppler--  Bilateral: 1-39% internal carotid artery stenosis. Vertebral artery flow is antegrade.   Renal ultrasound--No evidence of renal artery stenosis bilaterally.   CXR    EKG  normal sinus rhythm rate 91 beats per minute  Therapy Recommendations CIR  Physical Exam   Mental Status:  Alert, awake, oriented x 4, thought content appropriate. Mild dysarthria without evidence of aphasia. Able to follow 3 step commands without difficulty.  Cranial Nerves:  II: Discs flat bilaterally; Visual fields grossly normal, pupils equal, round, reactive to light and accommodation  III,IV, VI: ptosis not present, extra-ocular motions intact bilaterally  V:facial light touch sensation normal bilaterally  VII: mild  Lower left face weakness.  VIII: hearing normal bilaterally  IX,X: gag reflex present  XI: bilateral shoulder shrug  XII: midline tongue extension  Motor:  Significant for mild left hemiplegia 4/5 strength with drift Tone and bulk:normal bulk throughout, decreased tone left side; no atrophy noted  Sensory: Pinprick and light touch impaired in the left side.  Deep Tendon Reflexes:  Right: Upper Extremity Left: Upper extremity  biceps (C-5 to C-6) 2/4 biceps (C-5 to C-6) 2/4  tricep (C7) 2/4 triceps (C7) 2/4  Brachioradialis (C6) 2/4 Brachioradialis (C6) 2/4  Lower Extremity Lower Extremity  quadriceps (L-2 to L-4) 2/4 quadriceps (L-2 to L-4) 2/4  Achilles (S1) 2/4 Achilles (S1) 2/4  Plantars:  Right: downgoing Left: downgoing  Cerebellar:  normal finger-to-nose, normal heel-to-shin test in the left. Couldn't be tested in the left  due to hemiplegia.  Gait:  Unable to test.  CV: pulses palpable throughout    ASSESSMENT [name] is a 27 y.o. male presenting with left hemiparesis, dysarthria, headache, nausea. Imaging confirms a 3.2 x 1.9 cm right basal ganglia/sub insular acute intraparenchymal hematoma with local mass effect, no midline shift. 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  On no antithrombotics prior to admission. Now on no antithrombotics for secondary stroke prevention. Patient with resultant left  hemiparesis, headache. Work up completed.   Malignant hypertension  ICH  Hypokalemia - improved - potassium 3.6 today  LDL 76, at goal, no statin indicated.  HGBA1C ,  5.1  Acute dehydration, renal function improved.  Hospital day # 5  TREATMENT/PLAN  Continue no antithrombotics for secondary stroke prevention due to Hemorrhage  Strict control of HT - Now off cardene drip. Did not tolerate when necessary hydralazine secondary to nausea. When necessary labetalol ordered. Metoprolol increased to 50 mg twice a day. Monitor for bradycardia. Also on Norvasc 10 mg daily. Will add ACE inhibitor as discussed with Dr. Nona Dell.  Patient with recent dehydration. Will avoid adding diuretic. Add clonidine. Monitor renal function while on ACE.   Patient claustrophobic of MRI, even with medications for relaxation.   CIR recommended. If agreeable, ok to transfer to rehab.  Will add clonidine for blood pressure. Patient has had elevated blood pressure for over 5 years untreated. Mom confirms that his blood pressure has been "watched" by previous physicians but never on anything more than samples. Strong family history of hard to treat blood pressures. Renal artery stenosis study negative. Now will do 24 hour urine study for pheochromocytoma.  Gwendolyn Lima. Manson Passey, [facility], MBA, MHA [facility] Pager: [phone] 07/01/2013 10:49 AM   I have personally obtained a history,  examined the patient, evaluated imaging results, and formulated the assessment and plan of care. I agree with the above.   Delia Heady, MD

## 2013-07-01 NOTE — Progress Notes (Signed)
Rehab admissions - Noted elevated BP and need for labetolol.  I am waiting on response from [facility] regarding possible acute inpatient rehab admission.  I will update all when I hear back from insurance case manager.  Call me for questions.  #[phone]

## 2013-07-01 NOTE — Progress Notes (Signed)
Report received from 4 [facility] nurse report blood pressure at 1230 190/137.  RN receiving the patient notified Delle Reining, PA of BP states she is aware of the BP and patient is okay to come to inpatient rehab.

## 2013-07-01 NOTE — Progress Notes (Signed)
Patient admitted to inpatient rehab at 1815.  Blood pressure 170/107, patient is asymptomatic.  Report off to incoming nurse to monitor VS q4h per order.

## 2013-07-01 NOTE — Progress Notes (Signed)
Rehab admissions - I have approval for acute inpatient rehab admission for today.  I have talked with stroke team about high BP with starting of additional meds today.  Patient is very alert and interactive, sitting up in chair at this time.  Bed available and will plan to admit to acute inpatient rehab today.  Call me for questions.  #[phone]

## 2013-07-02 ENCOUNTER — Inpatient Hospital Stay (HOSPITAL_COMMUNITY): Payer: BC Managed Care – PPO | Admitting: Occupational Therapy

## 2013-07-02 ENCOUNTER — Inpatient Hospital Stay (HOSPITAL_COMMUNITY): Payer: BC Managed Care – PPO | Admitting: Physical Therapy

## 2013-07-02 ENCOUNTER — Inpatient Hospital Stay (HOSPITAL_COMMUNITY): Payer: BC Managed Care – PPO | Admitting: Speech Pathology

## 2013-07-02 DIAGNOSIS — I619 Nontraumatic intracerebral hemorrhage, unspecified: Secondary | ICD-10-CM

## 2013-07-02 DIAGNOSIS — I10 Essential (primary) hypertension: Secondary | ICD-10-CM

## 2013-07-02 LAB — CBC WITH DIFFERENTIAL/PLATELET
Basophils Absolute: 0.1 10*3/uL (ref 0.0–0.1)
Basophils Relative: 1 % (ref 0–1)
Eosinophils Absolute: 0.2 10*3/uL (ref 0.0–0.7)
Eosinophils Relative: 3 % (ref 0–5)
HCT: 45.3 % (ref 39.0–52.0)
MCH: 30.9 pg (ref 26.0–34.0)
MCHC: 35.1 g/dL (ref 30.0–36.0)
MCV: 88 fL (ref 78.0–100.0)
Monocytes Absolute: 0.6 10*3/uL (ref 0.1–1.0)
Monocytes Relative: 8 % (ref 3–12)
Neutro Abs: 4.9 10*3/uL (ref 1.7–7.7)
Platelets: 197 10*3/uL (ref 150–400)
RDW: 12.7 % (ref 11.5–15.5)
WBC: 7.4 10*3/uL (ref 4.0–10.5)

## 2013-07-02 LAB — COMPREHENSIVE METABOLIC PANEL
ALT: 42 U/L (ref 0–53)
AST: 33 U/L (ref 0–37)
BUN: 20 mg/dL (ref 6–23)
Calcium: 8.7 mg/dL (ref 8.4–10.5)
Creatinine, Ser: 1.34 mg/dL (ref 0.50–1.35)
GFR calc Af Amer: 82 mL/min — ABNORMAL LOW (ref >90)
Glucose, Bld: 91 mg/dL (ref 70–99)
Sodium: 142 mEq/L (ref 135–145)
Total Protein: 6.6 g/dL (ref 6.0–8.3)

## 2013-07-02 LAB — CREATININE, URINE, 24 HOUR
Creatinine, 24H Ur: 282 mg/d — ABNORMAL LOW (ref 800–2000)
Urine Total Volume-UCRE24: 900 mL

## 2013-07-02 MED ORDER — CLONIDINE HCL 0.1 MG PO TABS
0.1000 mg | ORAL_TABLET | Freq: Two times a day (BID) | ORAL | Status: DC
  Administered 2013-07-02 – 2013-07-15 (×26): 0.1 mg via ORAL
  Filled 2013-07-02 (×28): qty 1

## 2013-07-02 MED ORDER — METOPROLOL TARTRATE 50 MG PO TABS
50.0000 mg | ORAL_TABLET | Freq: Two times a day (BID) | ORAL | Status: DC
  Administered 2013-07-02 – 2013-07-04 (×4): 50 mg via ORAL
  Filled 2013-07-02 (×6): qty 1

## 2013-07-02 MED ORDER — AMLODIPINE BESYLATE 10 MG PO TABS
10.0000 mg | ORAL_TABLET | Freq: Every day | ORAL | Status: DC
  Administered 2013-07-03 – 2013-07-09 (×6): 10 mg via ORAL
  Filled 2013-07-02 (×7): qty 1

## 2013-07-02 MED ORDER — BENAZEPRIL HCL 10 MG PO TABS
10.0000 mg | ORAL_TABLET | Freq: Every day | ORAL | Status: DC
  Administered 2013-07-03 – 2013-07-07 (×5): 10 mg via ORAL
  Filled 2013-07-02 (×6): qty 1

## 2013-07-02 MED ORDER — HYDROCODONE-ACETAMINOPHEN 5-325 MG PO TABS: 1.0000 | ORAL_TABLET | Freq: Four times a day (QID) | ORAL | Status: DC | PRN

## 2013-07-02 NOTE — Evaluation (Signed)
Occupational Therapy Assessment and Plan  Patient Details  Name: Paul Reeves MRN: [id number] Date of Birth: 11-24-1985  OT Diagnosis: hemiplegia affecting non-dominant side, muscle weakness Rehab Potential: Rehab Potential: Excellent ELOS:   2 weeks.  Today's Date: 07/02/2013 Time: 0930-1030 Problem List:  Patient Active Problem List   Diagnosis Date Noted  . 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 Hypokalemia 07/01/2013  . Intracranial hemorrhage 07/01/2013    Past Medical History:  Past Medical History  Diagnosis Date  . Hypertension 06/26/2013    new dx  . Seasonal allergies     in summer and spring.   Past Surgical History: History reviewed. No pertinent past surgical history.  Assessment & Plan Clinical Impression:Patient is a 27 y.o. year old male with recent admission to the hospital on 06/26/13. Blood pressure SBP-250 per EMS reports. 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 Patient transferred to CIR on 07/01/2013 .      Patient currently requires mod to min A with basic self-care skills secondary to muscle weakness and decreased coordination, hemiplegia, decreased cardiorespiratory endurance.  Prior to hospitalization, patient could complete BADL's with independent .  Patient will benefit from skilled intervention to decrease level of assist with basic self-care skills and increase independence with basic self-care skills prior to discharge home with care partner.  Anticipate patient will require intermittent supervision and follow up outpatient.  OT - End of Session Endurance Deficit: Yes Endurance Deficit Description: limited by high BP OT Assessment Rehab Potential: Excellent OT Patient demonstrates impairments in the following area(s): Balance;Endurance;Motor;Safety OT Basic ADL's Functional Problem(s): Eating;Grooming;Bathing;Dressing;Toileting OT Transfers Functional Problem(s): Toilet;Tub/Shower OT  Additional Impairment(s): Fuctional Use of Upper Extremity OT Plan OT Intensity: Minimum of 1-2 x/day, 45 to 90 minutes OT Frequency: 5 out of 7 days OT Treatment/Interventions: Balance/vestibular training;Disease mangement/prevention;Neuromuscular re-education;Patient/family education;Self Care/advanced ADL retraining;Therapeutic Exercise;UE/LE Coordination activities;UE/LE Strength taining/ROM;Therapeutic Activities;Functional mobility training;DME/adaptive equipment instruction OT Self Feeding Anticipated Outcome(s): mod I OT Basic Self-Care Anticipated Outcome(s): mod I OT Toileting Anticipated Outcome(s): mod I OT Bathroom Transfers Anticipated Outcome(s): mod I OT Recommendation Patient destination: Home Follow Up Recommendations: Outpatient OT Equipment Recommended: Tub/shower bench   Skilled Therapeutic Intervention Pt engaged in 1:1 evaluation/self-care retraining session. BP 172/108 at beginning of session, seated in recliner.  Bathed at sink from recliner with min cues to attend to task, pt very talkative.  Physical assist for crossing LUE to wash/don clothing.   TED hose applied prior to standing, pt stood with min A for washing buttocks, mod-min A for dynamic standing balance, multimodal cues for midline posture.   No complaints of dizziness, pt's BP immediately after standing for approximately one minute was 191/121.  Pt completed donning clothing  and placed in supine position in recliner with feet elevated.  BP 173/106 after several minutes of rest in this position.  Completed grooming seated at sink.  No complaints of pain. PA notified of BP issues this session  OT Evaluation Precautions/Restrictions  Precautions Precautions: Fall Precaution Comments: HIGH BLOOD PRESSURE!!! Restrictions Weight Bearing Restrictions: No General Missed Time Reason: Patient ill (comment) (diastolic BP 121) Vital Signs Therapy Vitals Temp: 97.9 F (36.6 C) Temp src: Oral Pulse Rate:  74 Resp: 18 BP: 165/100 mmHg (notified nurse) Patient Position, if appropriate: Sitting Oxygen Therapy SpO2: 100 % O2 Device: None (Room air) Pain Pain Assessment Pain Assessment: No/denies pain Home Living/Prior Functioning Home Living Available Help at Discharge: Family Type of Home: House Home Access: Stairs to  enter Entrance Stairs-Number of Steps: 1 Home Layout: One level  Lives With: Family Prior Function Level of Independence: Independent with basic ADLs;Independent with gait;Independent with transfers  Able to Take Stairs?: Yes Driving: Yes ADL   Vision/Perception  Vision - History Baseline Vision: No visual deficits Patient Visual Report: No change from baseline Vision - Assessment Eye Alignment: Within Functional Limits Perception Perception: Within Functional Limits Praxis Praxis: Intact  Cognition Overall Cognitive Status: Within Functional Limits for tasks assessed Arousal/Alertness: Awake/alert Orientation Level: Oriented X4 Attention: Selective Selective Attention: Appears intact Memory: Appears intact Safety/Judgment: intact Sensation Sensation Light Touch: Appears Intact Light Touch Impaired Details: Impaired LUE;Impaired LLE Stereognosis: Not tested Hot/Cold: Appears Intact Proprioception: Impaired Detail Proprioception Impaired Details: Impaired LUE;Impaired LLE Coordination Gross Motor Movements are Fluid and Coordinated: No Motor  Motor Motor: Hemiplegia Motor - Skilled Clinical Observations: clonus L ankle Mobility  Bed Mobility Supine to Sit: 4: Min assist Supine to Sit Details (indicate cue type and reason): steadying assist at trunk Sitting - Scoot to Edge of Bed: 4: Min assist Sitting - Scoot to [place] of Bed Details (indicate cue type and reason): min A for balance with lifting bottom and bearing wt through B LEs to scoot forward Transfers Transfers: Sit to Stand;Stand to Sit Sit to Stand: 4: Min assist;With upper extremity  assist  Trunk/Postural Assessment  Cervical Assessment Cervical Assessment: Within Functional Limits Thoracic Assessment Thoracic Assessment: Within Functional Limits Lumbar Assessment Lumbar Assessment: Within Functional Limits Postural Control Postural Control: Deficits on evaluation Righting Reactions: delayed  Balance Balance Balance Assessed: Yes Static Sitting Balance Static Sitting - Level of Assistance: 5: Stand by assistance Dynamic Sitting Balance Dynamic Sitting - Balance Support: No upper extremity supported;During functional activity Dynamic Sitting - Level of Assistance: 5: Stand by assistance Static Standing Balance Static Standing - Level of Assistance: 3: Mod assist Dynamic Standing Balance Dynamic Standing - Balance Support: During functional activity Dynamic Standing - Level of Assistance: 3: Mod assist Extremity/Trunk Assessment RUE Assessment RUE Assessment: Within Functional Limits LUE Assessment LUE Assessment: Exceptions to WFL LUE AROM (degrees) Overall AROM Left Upper Extremity: Deficits (grossly 90 degrees active shoulder flexion ) LUE PROM (degrees) Overall PROM Left Upper Extremity: Within functional limits for tasks assessed LUE Strength LUE Overall Strength: Deficits (3-/5 shoulder, 3/5 remainder) LUE Tone LUE Tone:  (0 on modified [place])  FIM:  FIM - Grooming Grooming Steps: Wash, rinse, dry face;Wash, rinse, dry hands;Oral care, brush teeth, clean dentures Grooming: 5: Set-up assist to obtain items FIM - Bathing Bathing Steps Patient Completed: Chest;Abdomen;Front perineal area;Right Arm;Left upper leg;Buttocks;Left Arm;Right lower leg (including foot);Right upper leg;Left lower leg (including foot) Bathing: 4: Steadying assist FIM - Upper Body Dressing/Undressing Upper body dressing/undressing steps patient completed: Thread/unthread right sleeve of pullover shirt/dresss;Thread/unthread left sleeve of pullover shirt/dress;Put head  through opening of pull over shirt/dress;Pull shirt over trunk Upper body dressing/undressing: 4: Min-Patient completed 75 plus % of tasks FIM - Lower Body Dressing/Undressing Lower body dressing/undressing steps patient completed: Thread/unthread left pants leg;Pull underwear up/down;Thread/unthread right underwear leg;Thread/unthread left underwear leg;Thread/unthread right pants leg;Pull pants up/down Lower body dressing/undressing: 3: Mod-Patient completed 50-74% of tasks FIM - Bed/Chair Transfer Bed/Chair Transfer: 4: Supine > Sit: Min A (steadying Pt. > 75%/lift 1 leg);3: Chair or W/C > Bed: Mod A (lift or lower assist);3: Bed > Chair or W/C: Mod A (lift or lower assist)   Refer to Care Plan for Long Term Goals  Recommendations for other services: None  Discharge Criteria: Patient will  be discharged from OT if patient refuses treatment 3 consecutive times without medical reason, if treatment goals not met, if there is a change in medical status, if patient makes no progress towards goals or if patient is discharged from hospital.  The above assessment, treatment plan, treatment alternatives and goals were discussed and mutually agreed upon: by patient  Kandis Ban 07/02/2013, 12:20 PM  This note has been reviewed and this clinician agrees with information provided.

## 2013-07-02 NOTE — Progress Notes (Signed)
Subjective/Complaints: Rested fairly well. Denies headache or other pain this morning. Ready for therapies. A 12 point review of systems has been performed and if not noted above is otherwise negative.   Objective: Vital Signs: Blood pressure 152/100, pulse 76, temperature 98.1 F (36.7 C), temperature source Oral, resp. rate 17, SpO2 98.00%. No results found.  Recent Labs  07/02/13 0550  WBC 7.4  HGB 15.9  HCT 45.3  PLT 197    Recent Labs  06/30/13 0600 07/02/13 0550  NA 140 142  K 3.6 3.9  CL 108 110  GLUCOSE 92 91  BUN 14 20  CREATININE 1.32 1.34  CALCIUM 8.7 8.7   CBG (last 3)  No results found for this basename: GLUCAP,  in the last 72 hours  Wt Readings from Last 3 Encounters:  06/26/13 101.9 kg (224 lb 10.4 oz)    Physical Exam:  Nursing note and vitals reviewed.  Constitutional: He is oriented to person, place, and time. He appears well-developed and well-nourished.  HENT:  Head: Normocephalic and atraumatic.  Eyes: Conjunctivae are normal. Pupils are equal, round, and reactive to light.  Neck: Normal range of motion. Neck supple.  Cardiovascular: Normal rate and regular rhythm. No murmur  Pulmonary/Chest: Effort normal and breath sounds normal. No respiratory distress. He has no wheezes.  Abdominal: Soft. Bowel sounds are normal. He exhibits no distension. There is tenderness.  Musculoskeletal: He exhibits no edema and no tenderness.  Neurological: He is alert and oriented to person, place, and time.  Speech clear. Left facial weakness. Left hemiparesis with sensory deficits. LUE is 2+/3- deltoid, 3-/5 bic, tric, HI. LLE is 3-/5 HF, KE; ADF tr, APF 1. Sensation 1/2 left arm and leg. Mild left facial weakness, visual fields intact. Cognitively remains appropriate although he may have decreased insight into his deficits.  Skin: Skin is warm and dry.  Psychiatric: He has a normal mood and affect. His behavior is normal. Judgment and thought content normal.     Assessment/Plan: 1. Functional deficits secondary to right basal ganglia/sub insular IPH which require 3+ hours per day of interdisciplinary therapy in a comprehensive inpatient rehab setting. Physiatrist is providing close team supervision and 24 hour management of active medical problems listed below. Physiatrist and rehab team continue to assess barriers to discharge/monitor patient progress toward functional and medical goals. FIM:                   Comprehension Comprehension Mode: Auditory Comprehension: 7-Follows complex conversation/direction: With no assist  Expression Expression Mode: Verbal Expression: 7-Expresses complex ideas: With no assist  Social Interaction Social Interaction: 7-Interacts appropriately with others - No medications needed.  Problem Solving Problem Solving: 7-Solves complex problems: Recognizes & self-corrects  Memory Memory: 7-Complete Independence: No helper  Medical Problem List and Plan:  1. DVT Prophylaxis/Anticoagulation: Mechanical: Antiembolism stockings, knee (TED hose) Bilateral lower extremities  Sequential compression devices, below knee Bilateral lower extremities  2. Pain Management:  prn vicodin for headaches. Overall improving.  3. Mood: Will have LCSW follow for evaluation. Poor insight with impaired awareness at this time. Will monitor.  4. Neuropsych: This patient is capable of making decisions on his own behalf.  5. Malignant HTN: Will monitor every four hours and titrate medications as indicated  LOS (Days) 1 A FACE TO FACE EVALUATION WAS PERFORMED  SWARTZ,ZACHARY T 07/02/2013 8:21 AM

## 2013-07-02 NOTE — Evaluation (Signed)
Physical Therapy Assessment and Plan  Patient Details  Name: Paul Reeves MRN: [id number] Date of Birth: 1986/03/02  PT Diagnosis: Difficulty walking, Hemiplegia non-dominant, Impaired sensation and Muscle weakness Rehab Potential: Good ELOS: 10-14 days   Today's Date: 07/02/2013 Time: 0830-0900 Time Calculation (min): 30 min  Problem List:  Patient Active Problem List   Diagnosis Date Noted  . 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 Hypokalemia 07/01/2013  . Intracranial hemorrhage 07/01/2013    Past Medical History:  Past Medical History  Diagnosis Date  . Hypertension 06/26/2013    new dx  . Seasonal allergies     in summer and spring.   Past Surgical History: History reviewed. No pertinent past surgical history.  Assessment & Plan Clinical Impression: Patient is a 27 y.o. year old male with recent admission to the hospital on 06/26/13. Blood pressure SBP-250 per EMS reports. 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  Patient transferred to CIR on 07/01/2013 .   Patient currently requires mod with mobility secondary to muscle weakness, decreased cardiorespiratoy endurance and decreased standing balance, decreased postural control, hemiplegia and decreased balance strategies.  Prior to hospitalization, patient was independent  with mobility and lived with Family in a House home.  Home access is 1Stairs to enter.  Patient will benefit from skilled PT intervention to maximize safe functional mobility, minimize fall risk and decrease caregiver burden for planned discharge home with 24 hour supervision.  Anticipate patient will benefit from follow up OP at discharge.  PT - End of Session Activity Tolerance: Tolerates < 10 min activity with changes in vital signs Endurance Deficit: Yes Endurance Deficit Description: limited by high BP PT Assessment Rehab Potential: Good PT Patient demonstrates impairments in the following area(s):  Balance;Endurance;Motor;Sensory;Perception PT Transfers Functional Problem(s): Bed Mobility;Bed to Chair;Car;Furniture PT Locomotion Functional Problem(s): Ambulation;Wheelchair Mobility;Stairs PT Plan PT Intensity: Minimum of 1-2 x/day ,45 to 90 minutes PT Frequency: 5 out of 7 days PT Duration Estimated Length of Stay: 10-14 days PT Treatment/Interventions: Ambulation/gait training;Discharge planning;Functional mobility training;Therapeutic Activities;Therapeutic Exercise;Wheelchair propulsion/positioning;Balance/vestibular training;Neuromuscular re-education;Pain management;Splinting/orthotics;UE/LE Strength taining/ROM;DME/adaptive equipment instruction;Community reintegration;Patient/family education;Stair training;Functional electrical stimulation;UE/LE Coordination activities PT Transfers Anticipated Outcome(s): mod I PT Locomotion Anticipated Outcome(s): mod I w/c, min A gait PT Recommendation Follow Up Recommendations: Outpatient PT Patient destination: Home  Skilled Therapeutic Intervention BP supine 154/95, BP sitting 174/121, PA made aware.  Per PA no therapy until BP <110 diastolic.  Pt transferred to recliner to relax and eat breakfast.  Limited eval, will attempt gait and stairs as appropriate.  PT Evaluation Precautions/Restrictions Precautions Precautions: Fall Precaution Comments: HIGH BLOOD PRESSURE!!! Restrictions Weight Bearing Restrictions: No  Pain Pain Assessment Pain Assessment: No/denies pain Home Living/Prior Functioning Home Living Available Help at Discharge: Family Type of Home: House Home Access: Stairs to enter Secretary/administrator of Steps: 1 Home Layout: One level  Lives With: Family Prior Function Level of Independence: Independent with basic ADLs;Independent with gait;Independent with transfers  Able to Take Stairs?: Yes Driving: Yes  Cognition Overall Cognitive Status: Within Functional Limits for tasks assessed Arousal/Alertness:  Awake/alert Orientation Level: Oriented X4 Sensation Sensation Light Touch: Impaired Detail Light Touch Impaired Details: Impaired LUE;Impaired LLE Proprioception: Impaired Detail Proprioception Impaired Details: Impaired LUE;Impaired LLE Coordination Gross Motor Movements are Fluid and Coordinated: No Motor  Motor Motor: Hemiplegia Motor - Skilled Clinical Observations: clonus L ankle  Mobility Bed Mobility Supine to Sit: 4: Min assist Supine to Sit Details (indicate cue type and reason): steadying  assist at trunk Sitting - Scoot to Edge of Bed: 4: Min assist Sitting - Scoot to [place] of Bed Details (indicate cue type and reason): min A for balance with lifting bottom and bearing wt through B LEs to scoot forward Transfers Stand Pivot Transfers: 3: Mod assist Stand Pivot Transfer Details (indicate cue type and reason): pt tends to "hop" on R LE, requires manual facilitation for wt shifting to L LE, assist at trunk for balance Locomotion  Ambulation Ambulation:  (unable to assess on eval due to BP) Stairs / Additional Locomotion Stairs:  (unable to assess due to BP)  Trunk/Postural Assessment  Cervical Assessment Cervical Assessment: Within Functional Limits Thoracic Assessment Thoracic Assessment: Within Functional Limits Lumbar Assessment Lumbar Assessment: Within Functional Limits Postural Control Postural Control: Deficits on evaluation Righting Reactions: delayed  Balance Static Sitting Balance Static Sitting - Level of Assistance: 5: Stand by assistance Static Standing Balance Static Standing - Level of Assistance: 3: Mod assist Dynamic Standing Balance Dynamic Standing - Level of Assistance: 3: Mod assist Extremity Assessment      RLE Assessment RLE Assessment: Within Functional Limits LLE Assessment LLE Assessment:  (hip 3-/5, knee 3/5, ankle 0/5)  FIM:  FIM - Bed/Chair Transfer Bed/Chair Transfer: 4: Supine > Sit: Min A (steadying Pt. > 75%/lift 1 leg);3:  Chair or W/C > Bed: Mod A (lift or lower assist);3: Bed > Chair or W/C: Mod A (lift or lower assist) FIM - Locomotion: Wheelchair Locomotion: Wheelchair:  (unable to perform on eval due to BP) FIM - Locomotion: Ambulation Locomotion: Ambulation:  (unable to perform on eval due to BP) FIM - Locomotion: Stairs Locomotion: Stairs:  (unable to perform due to BP)   Refer to Care Plan for Long Term Goals  Recommendations for other services: None  Discharge Criteria: Patient will be discharged from PT if patient refuses treatment 3 consecutive times without medical reason, if treatment goals not met, if there is a change in medical status, if patient makes no progress towards goals or if patient is discharged from hospital.  The above assessment, treatment plan, treatment alternatives and goals were discussed and mutually agreed upon: by patient  [facility] 07/02/2013, 9:24 AM

## 2013-07-02 NOTE — Discharge Summary (Signed)
Stroke Discharge Summary   Patient ID: Paul Reeves  MRN: [id number]  DOB: [birth year]  Date of Admission: 06/26/2013  Date of Discharge: 07/01/2013  Attending Physician: Darcella Cheshire, MD, Stroke MD  Consulting Physician(s): Treatment Team:  Md Stroke, MD None and rehabilitation medicine  Patient's PCP: No primary provider on file.  Discharge Diagnoses:  Principal Problem:  Intracranial hemorrhage  Active Problems:  Malignant hypertension  Hypokalemia  BMI Body mass index is 30.46 kg/(m^2).  Past Medical History   Diagnosis  Date   .  Hypertension  06/26/2013     new dx   .  Seasonal allergies      in summer and spring.    History reviewed. No pertinent past surgical history.  Medications to be continued on Rehab  .  amLODipine  10 mg  Oral  Daily   .  benazepril  10 mg  Oral  Daily   .  cloNIDine  0.1 mg  Oral  BID   .  metoprolol tartrate  50 mg  Oral  BID   .  pantoprazole  40 mg  Oral  Daily   .  senna-docusate  1 tablet  Oral  BID    LABORATORY STUDIES  CBC    Component  Value  Date/Time    WBC  12.0*  06/26/2013 0140    RBC  5.14  06/26/2013 0140    HGB  16.3  06/26/2013 0140    HCT  44.1  06/26/2013 0140    PLT  266  06/26/2013 0140    MCV  85.8  06/26/2013 0140    MCH  31.7  06/26/2013 0140    MCHC  37.0*  06/26/2013 0140    RDW  12.3  06/26/2013 0140    LYMPHSABS  5.5*  06/26/2013 0140    MONOABS  1.0  06/26/2013 0140    EOSABS  0.3  06/26/2013 0140    BASOSABS  0.1  06/26/2013 0140    CMP    Component  Value  Date/Time    NA  140  06/30/2013 0600    K  3.6  06/30/2013 0600    CL  108  06/30/2013 0600    CO2  22  06/30/2013 0600    GLUCOSE  92  06/30/2013 0600    BUN  14  06/30/2013 0600    CREATININE  1.32  06/30/2013 0600    CALCIUM  8.7  06/30/2013 0600    PROT  7.4  06/26/2013 0140    ALBUMIN  3.6  06/26/2013 0140    AST  24  06/26/2013 0140    ALT  21  06/26/2013 0140    ALKPHOS  64  06/26/2013 0140    BILITOT  0.5  06/26/2013 0140    GFRNONAA  72*   06/30/2013 0600    GFRAA  84*  06/30/2013 0600    COAGS  Lab Results   Component  Value  Date    INR  0.98  06/26/2013    Lipid Panel    Component  Value  Date/Time    CHOL  132  06/27/2013 0418    TRIG  68  06/27/2013 0418    HDL  42  06/27/2013 0418    CHOLHDL  3.1  06/27/2013 0418    VLDL  14  06/27/2013 0418    LDLCALC  76  06/27/2013 0418    HgbA1C  Lab Results   Component  Value  Date  HGBA1C  5.1  06/26/2013    Cardiac Panel (last 3 results) No results found for this basename: CKTOTAL, CKMB, TROPONINI, RELINDX, in the last 72 hours  Urinalysis  No results found for this basename: colorurine, appearanceur, labspec, phurine, glucoseu, hgbur, bilirubinur, ketonesur, proteinur, urobilinogen, nitrite, leukocytesur    Urine Drug Screen    Component  Value  Date/Time    LABOPIA  NONE DETECTED  06/26/2013 0927    COCAINSCRNUR  NONE DETECTED  06/26/2013 0927    LABBENZ  NONE DETECTED  06/26/2013 0927    AMPHETMU  NONE DETECTED  06/26/2013 0927    THCU  NONE DETECTED  06/26/2013 0927    LABBARB  NONE DETECTED  06/26/2013 2536    Alcohol Level  No results found for this basename: eth    SIGNIFICANT DIAGNOSTIC STUDIES  Ct Head (brain) Wo Contrast  06/26/2013  3.2 x 1.9 cm right basal ganglia/sub insular acute intraparenchymal hematoma with local mass effect, no midline shift.  Head CT - Repeat  06/29/2013  Stable acute parenchymal hemorrhage over the posterior right basal  ganglia measuring 1.9 x 3.0 cm in transverse and AP dimensions with  slightly more adjacent edema. No additional foci of acute  hemorrhage.  MRI of the brain Patient unable to tolerate  MRA of the brain  2D Echocardiogram EF 55%, wall motion normal, left atrium mildly dilated. No ASD or PFO identified.  Carotid Doppler-- Bilateral: 1-39% internal carotid artery stenosis. Vertebral artery flow is antegrade.  Renal ultrasound--No evidence of renal artery stenosis bilaterally  History of Present Illness  Paul  Reeves is a 27 y.o. male without known past medical history, brought to [facility] ED 06/26/2013, as a code stroke by EMS after being found lying in the bathroom by family.  He was last seen normal at 0045 that day. Stated that he collapsed across the bathroom, remained conscious all the time, but couldn't get up from the floor and started calling for help. His mother said that he was struggling trying to move the left side floor.  When EMS arrived to the scene he was alert and awake, unable to move the left side, some speech impairment, and SBP 250.  Initial NIHSS 12.  CT brain reveled a 3.2 x 1.9 cm right basal ganglia/sub insular acute intraparenchymal hematoma with local mass effect, no midline shift, no intraventricular extension.  On arrival he complained of HA and nausea, but denies vertigo, double vision, difficulty swallowing, confusion, or visual disturbances.  Received 20 mg IV labetalol while awaiting for cardene drip  Date last known well: 10/1//14  Time last known well: 12:45am  tPA Given: no, ICH  NIHSS: 12  MRS: 3  Hospital Course  [name] is a 27 y.o. male presenting with left hemiparesis, dysarthria, headache, nausea. Imaging confirms a 3.2 x 1.9 cm right basal ganglia/sub insular acute intraparenchymal hematoma with local mass effect, no midline shift. 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 On no antithrombotics prior to admission. Now on no antithrombotics for secondary stroke prevention. Patient with resultant left hemiparesis, headache.  Malignant hypertension  ICH  Hypokalemia - treated  LDL 76, at goal, no statin indicated.  HGBA1C , 5.1  Acute dehydration, treated with IV fluids. Hospital day # 5  TREATMENT/PLAN  Continue no antithrombotics for secondary stroke prevention due to Hemorrhage  Strict control of HT - medications adjusted. Renal ultrasound did not show evidence of renal artery stenosis. Patient is now collecting 24 hour urine to  test for pheochromocytoma.  Patient with recent dehydration. Will avoid adding diuretic. Add clonidine. Monitor renal function while on ACE.  Patient claustrophobic of MRI, even with medications for relaxation.  CIR recommended. Ok to transfer to rehab.  Will add clonidine for blood pressure. Patient has had elevated blood pressure for over 5 years untreated. Mom confirms that his blood pressure has been "watched" by previous physicians but never on anything more than samples. Strong family history of hard to treat blood pressures. Renal artery stenosis study negative. Now will do 24 hour urine study for pheochromocytoma Patient with vascular risk factors of:  Hypertension, uncontrolled Patient has resultant left hemiparesis. Physical therapy, occupational therapy and speech therapy evaluated patient. All agreed inpatient rehab is needed. Patient's family is/are supportive and can provide care at discharge. CIR bed is available today and patient will be transferred there.  Discharge Exam Blood pressure 190/137, pulse 87, temperature 98.5 F (36.9 C), temperature source Oral, resp. rate 18, height 6' (1.829 m), weight 101.9 kg (224 lb 10.4 oz), SpO2 100.00%.  Mental Status:  Alert, awake, oriented x 4, thought content appropriate. Mild dysarthria without evidence of aphasia. Able to follow 3 step commands without difficulty.  Cranial Nerves:  II: Discs flat bilaterally; Visual fields grossly normal, pupils equal, round, reactive to light and accommodation  III,IV, VI: ptosis not present, extra-ocular motions intact bilaterally  V:facial light touch sensation normal bilaterally  VII: mild Lower left face weakness.  VIII: hearing normal bilaterally  IX,X: gag reflex present  XI: bilateral shoulder shrug  XII: midline tongue extension  Motor:  Significant for mild left hemiplegia 4/5 strength with drift  Tone and bulk:normal bulk throughout, decreased tone left side; no atrophy noted  Sensory:  Pinprick and light touch impaired in the left side.  Deep Tendon Reflexes:  Right: Upper Extremity Left: Upper extremity  biceps (C-5 to C-6) 2/4 biceps (C-5 to C-6) 2/4  tricep (C7) 2/4 triceps (C7) 2/4  Brachioradialis (C6) 2/4 Brachioradialis (C6) 2/4  Lower Extremity Lower Extremity  quadriceps (L-2 to L-4) 2/4 quadriceps (L-2 to L-4) 2/4  Achilles (S1) 2/4 Achilles (S1) 2/4  Plantars:  Right: downgoing Left: downgoing  Cerebellar:  normal finger-to-nose, normal heel-to-shin test in the left. Couldn't be tested in the left due to hemiplegia.  Gait:  Unable to test.  CV: pulses palpable throughout  Discharge Diet Cardiac thin liquids  Discharge Plan  Disposition: Transfer to [facility] for ongoing PT, OT and ST  no antithrombotics for secondary stroke prevention.  Ongoing risk factor control by Primary Care Physician. Risk factor recommendations: Hypertension target range 130-140/70-80  Lipid range - LDL < 100 and checked every 6 months, fasting  Follow-up primary physician in 2 weeks after discharge.  Follow-up with Dr. Delia Heady, Stroke Clinic in 1 month after discharge. 35 minutes were spent preparing discharge.  Signed  Gwendolyn Lima. Manson Passey, [facility], MBA, MHA  [facility]  Pager: [phone]  07/01/2013 3:02 PM  I have personally examined this patient, reviewed pertinent data and developed the plan of care. I agree with above.   Delia Heady, MD

## 2013-07-02 NOTE — Evaluation (Signed)
Speech Language Pathology Assessment and Plan  Patient Details  Name: Paul Reeves MRN: [id number] Date of Birth: 1985-10-30  SLP Diagnosis:  N/A Rehab Potential:  (defer to OT/PT) ELOS:  (defer to OT/PT)  Today's Date: 07/02/2013 Time: 1440-1500 Time Calculation (min): 20 min  Problem List:  Patient Active Problem List   Diagnosis Date Noted  . 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 Hypokalemia 07/01/2013  . Intracranial hemorrhage 07/01/2013   Past Medical History:  Past Medical History  Diagnosis Date  . Hypertension 06/26/2013    new dx  . Seasonal allergies     in summer and spring.   Past Surgical History: History reviewed. No pertinent past surgical history.  Assessment / Plan / Recommendation Clinical Impression  Paul Reeves is a 27 y.o. male with history of HTN who was found on the bathroom floor by family with left sided weakness, difficulty talking and inability to move on 06/26/13. Blood pressure SBP-250 per EMS reports. 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 UDS negative. Treated with IV labetalol and started on cardene drip for BP control. Swallow evaluation without dysphagia and regular diet recommended. Blood pressures remain poorly controlled with headaches being a limiting factor. Renal artery duplex negative for stenosis. 24 hours urine ordered to rule out pheochromocytoma. Gait not attempted due to elevated BP; however, therapy team recommended CIR. Pt had persistent headaches over the last few days which have improved somewhat. Pt ultimately was admitted to [facility] 07/01/13. Orders received and cognitive-linguistic evaluation completed. Pt demonstrates WFL receptive and expressive communication abilities as well as overall cognition. As a result, no skilled SLP services are warranted at this time.     SLP Assessment  Patient does not need any further Speech Lanaguage Pathology Services    Recommendations  Patient destination: Home Follow up Recommendations: None Equipment Recommended: None recommended by SLP               Pain Pain Assessment Pain Assessment: No/denies pain Pain Score: 0-No pain Prior Functioning Cognitive/Linguistic Baseline: Within functional limits Vocation: Full time employment  See FIM for current functional status  Recommendations for other services: None  Discharge Criteria: Patient will be discharged from SLP if patient refuses treatment 3 consecutive times without medical reason, if treatment goals not met, if there is a change in medical status, if patient makes no progress towards goals or if patient is discharged from hospital.  The above assessment, treatment plan, treatment alternatives and goals were discussed and mutually agreed upon: by patient  Fae Pippin, M.A., CCC-SLP [phone]  Walden Statz 07/02/2013, 3:30 PM

## 2013-07-02 NOTE — Progress Notes (Signed)
Occupational Therapy Session Note  Patient Details  Name: Paul Reeves MRN: [id number] Date of Birth: [birth year]  Today's Date: 07/02/2013 Time: 1330-1400 Time Calculation (min): 30 min  Short Term Goals: Week 1:  OT Short Term Goal 1 (Week 1): Pt will complete tub/shower transfer with min A OT Short Term Goal 2 (Week 1): Pt will complete LB dressing tasks with min A. OT Short Term Goal 3 (Week 1): Pt will utilize LUE to bathe 50% of body  OT Short Term Goal 4 (Week 1): Pt will require no more than steadying assist for dynamic standing during clothing management.   Skilled Therapeutic Interventions/Progress Updates:  1:1 Neuro muscular reeducation with focus on normal patterns of movement with left UE proximal to distal movement with education on decreasing compensatory movements (ie hiking shoulder). Issued softest tan theraputty with simple table top activities. Pt with fatigue this pm - decreased in social interaction this pm. Pt reports he has been asleep in chair since last OT visit at 10:30am. Reheated lunch. Pt's aunts present for session.   Balance/vestibular training;Disease mangement/prevention;Neuromuscular re-education;Patient/family education;Self Care/advanced ADL retraining;Therapeutic Exercise;UE/LE Coordination activities;UE/LE Strength taining/ROM;Therapeutic Activities;Functional mobility training;DME/adaptive equipment instruction   Therapy Documentation Precautions:  Precautions Precautions: Fall Precaution Comments: HIGH BLOOD PRESSURE!!! Restrictions Weight Bearing Restrictions: No    Vital Signs: 174/116 in sitting without mobility other than UE movement  Pain:  no c/o pain   See FIM for current functional status  Therapy/Group: Individual Therapy  [facility] 07/02/2013, 2:50 PM

## 2013-07-02 NOTE — Progress Notes (Signed)
Patient information reviewed and entered into eRehab system by Kinzi Frediani, RN, CRRN, PPS Coordinator.  Information including medical coding and functional independence measure will be reviewed and updated through discharge.    

## 2013-07-03 ENCOUNTER — Inpatient Hospital Stay (HOSPITAL_COMMUNITY): Payer: BC Managed Care – PPO | Admitting: Physical Therapy

## 2013-07-03 ENCOUNTER — Inpatient Hospital Stay (HOSPITAL_COMMUNITY): Payer: BC Managed Care – PPO | Admitting: Occupational Therapy

## 2013-07-03 ENCOUNTER — Inpatient Hospital Stay (HOSPITAL_COMMUNITY): Payer: BC Managed Care – PPO | Admitting: Rehabilitation

## 2013-07-03 ENCOUNTER — Inpatient Hospital Stay (HOSPITAL_COMMUNITY): Payer: BC Managed Care – PPO | Admitting: *Deleted

## 2013-07-03 NOTE — Progress Notes (Signed)
Social Work  Social Work Assessment and Plan  Patient Details  Name: Paul Reeves MRN: [id number] Date of Birth: 1985-09-28  Today's Date: 07/03/2013  Problem List:  Patient Active Problem List   Diagnosis Date Noted  . 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 Hypokalemia 07/01/2013  . Intracranial hemorrhage 07/01/2013   Past Medical History:  Past Medical History  Diagnosis Date  . Hypertension 06/26/2013    new dx  . Seasonal allergies     in summer and spring.   Past Surgical History: History reviewed. No pertinent past surgical history. Social History:  reports that he has never smoked. He does not have any smokeless tobacco history on file. He reports that he drinks about 0.6 ounces of alcohol per week. He reports that he does not use illicit drugs.  Family / Support Systems Marital Status: Single Other Supports: mother, Kathlene November @ [phone]) [phone];  father, Murphy Duzan @ (C) [phone];  sister, Teofil Maniaci; "best friend", Markham Jordan - all living local  Anticipated Caregiver: mother to be primary with intermittent assist from sister, father and friend Ability/Limitations of Caregiver: Mom works days.  Sister works 3rd shift and may be able to stay with patient early mornings and 6 yr old brother "in and out" Caregiver Availability: 24/7 (Mom in evenings and ? sister in the mornings.) Family Dynamics: pt describes a very close relationship with family.  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  Laughing easily with this SW and other staff.  Will monitor through stay. Recent Psychosocial Issues: start of new job a few months ago Pyschiatric History: none Substance Abuse History: none  Patient / Family Perceptions, Expectations & Goals Pt/Family understanding of illness & functional limitations: pt and family with basic understanding about his ICH and relation to uncontrolled BP.  Is understandably concerned about being able to control BP to avoid any further events. Premorbid pt/family roles/activities: working f/t - "enjoying life" as a young adult;  close with family and friends Anticipated changes in roles/activities/participation: Little change anticipated with control over BP.  Therapists noting only limited deficits physically or cognitively Pt/family expectations/goals: "Just get this (BP) under control"  [employer]: None Premorbid Home Care/DME Agencies: None Transportation available at discharge: yes  Discharge Planning Living Arrangements: Parent;Other relatives Support Systems: Parent;Other relatives;Friends/neighbors Type of Residence: Private residence Insurance Resources: Media planner (specify) Sports coach) Financial Resources: Employment Financial Screen Referred: No Living Expenses: Lives with family Money Management: Patient Does the patient have any problems obtaining your medications?: No Home Management: pt and family share Patient/Family Preliminary Plans: pt plans to return home with his mother and 55 yr old  brother - all family sharing in any support he may need Social Work Anticipated Follow  Up Needs: HH/OP Expected length of stay: 2 weeks  Clinical Impression Very pleasant young man here following ICH and uncontrolled BP which continues to be problematic with med changes continuing.  Very little resulting deficits from ICH.  Anticipate good recovery overall and pt hopeful to be able to return to work.  Good family support.  No significant emotional support but will monitor.  Jedadiah Abdallah 07/03/2013, 5:35 PM

## 2013-07-03 NOTE — Progress Notes (Signed)
I have reviewed and agree with the treatment as reflected in this note. Coden Franchi, PT DPT  

## 2013-07-03 NOTE — Progress Notes (Signed)
Occupational Therapy Session Note  Patient Details  Name: Paul Reeves MRN: [id number] Date of Birth: [birth year]  Today's Date: 07/03/2013  Short Term Goals: Week 1:  OT Short Term Goal 1 (Week 1): Pt will complete tub/shower transfer with min A OT Short Term Goal 2 (Week 1): Pt will complete LB dressing tasks with min A. OT Short Term Goal 3 (Week 1): Pt will utilize LUE to bathe 50% of body  OT Short Term Goal 4 (Week 1): Pt will require no more than steadying assist for dynamic standing during clothing management.   Skilled Therapeutic Interventions/Progress Updates:  First session: Individual Time: 7829-5621 Time Calculation: 45 minutes   Pt engaged in 1:1 self-care session focused on NMR of LUE, dynamic sitting balance. BP prior to beginning session 160/107.  Engaged in bathing tasks seated at sink seated edge of recliner.  Utilized LUE for 75% of UB bathing tasks with [facility] assist.  Stood for <1 minute for clothing management/bathing tasks, BP after standing 162/102. L hand in weight bearing position on sink while standing, required min to mod A for dynamic standing, mod multimodal cues.  Pt demonstrated some initiation of correcting posture without prompting.   Educated on hemi-dressing technique and proper foot positioning for functional transfers, awareness of position of LUE during tasks.  Min verbal cues for sequencing ADL tasks.  Aunts present at conclusion of session and pt seated in recliner.    Second Session: Individual Time: 1430-1500 Time Calculation: 30 minutes.  Pt engaged in 1:1 session focused on NMR of LUE.  BP throughout session: 170's/112-116. Remained seated throughout session. Pt engaged in active pronation/supination of L forearm with assist to reach end range of supination. With LUE, pt able to reach forward to grasp two size balls placed anterior to pt and to varying degrees to the R and L in transverse plane, required minimal/moderate tactile support to  elbow/wrist for active movements. Transported objects to bedside table placed to R of pt.  Addressed in-hand manipulation skills of LUE with small ball, pt left with ball and encouraged to work on task outside of therapy with family assist as needed. No complaints of pain, one complaint of dizziness and feeling hot which resolved quickly with back support and brief rest break.    Therapy Documentation Precautions:  Precautions Precautions: Fall Precaution Comments: HIGH BLOOD PRESSURE!!! Restrictions Weight Bearing Restrictions: No  Pain:  None.  See FIM for current functional status  Therapy/Group: Individual Therapy  Kandis Ban 07/03/2013, 10:43 AM

## 2013-07-03 NOTE — Progress Notes (Signed)
Physical Therapy Session Note  Patient Details  Name: Bradely Rudin MRN: [id number] Date of Birth: 1986-04-12  Today's Date: 07/03/2013 Time: 1000-1025 Time Calculation (min): 25 min  Short Term Goals: Week 1:  PT Short Term Goal 1 (Week 1): Pt will perform functional transfers with min A PT Short Term Goal 2 (Week 1): Pt will gait with mod A in controlled environment 25'  Skilled Therapeutic Interventions/Progress Updates:    Patient received asleep in recliner, easily aroused. Session focused on functional transfers. Resting BP taken twice prior to any activity, first reading BP 148/100 HR 112, second reading BP 142/100, HR 110, with patient seated in recliner. PA and nursing notified of elevated HR, patient cleared for therapy. Patient stated he can inform therapist when he does not feel well. Patient stand pivot transfer recliner>wheelchair modA, verbal cues for hand placement and pivot steps with B LE. Patients vitals following transfer BP 159/108, HR 60 taken manually. Patient transported by therapist to gym. Patient instructed sit>stand minA, static standing 30", vitals while in standing BP 169/111 HR 64, patient returned to wheelchair. Patient returned to room, wheelchair>recliner minA stand pivot transfer. Vitals following transfer BP 152/111 HR 67. Ended session patient seated in recliner, with all needs within reach. Status communicated to nursing.   Therapy Documentation Precautions:  Precautions Precautions: Fall Precaution Comments: HIGH BLOOD PRESSURE!!! Restrictions Weight Bearing Restrictions: No   Pain: Pain Assessment Pain Assessment: No/denies pain Pain Score: 0-No pain  See FIM for current functional status  Therapy/Group: Individual Therapy  Doralee Albino 07/03/2013, 12:54 PM

## 2013-07-03 NOTE — Progress Notes (Signed)
Subjective/Complaints: Struggled in the afternoon yesterday with therapies. bp has been out of control.  A 12 point review of systems has been performed and if not noted above is otherwise negative.   Objective: Vital Signs: Blood pressure 140/100, pulse 64, temperature 98 F (36.7 C), temperature source Oral, resp. rate 16, SpO2 97.00%. No results found.  Recent Labs  07/02/13 0550  WBC 7.4  HGB 15.9  HCT 45.3  PLT 197    Recent Labs  07/02/13 0550  NA 142  K 3.9  CL 110  GLUCOSE 91  BUN 20  CREATININE 1.34  CALCIUM 8.7   CBG (last 3)  No results found for this basename: GLUCAP,  in the last 72 hours  Wt Readings from Last 3 Encounters:  06/26/13 101.9 kg (224 lb 10.4 oz)    Physical Exam:  Nursing note and vitals reviewed.  Constitutional: He is oriented to person, place, and time. He appears well-developed and well-nourished.  HENT:  Head: Normocephalic and atraumatic.  Eyes: Conjunctivae are normal. Pupils are equal, round, and reactive to light.  Neck: Normal range of motion. Neck supple.  Cardiovascular: Normal rate and regular rhythm. No murmur  Pulmonary/Chest: Effort normal and breath sounds normal. No respiratory distress. He has no wheezes.  Abdominal: Soft. Bowel sounds are normal. He exhibits no distension. There is tenderness.  Musculoskeletal: He exhibits no edema and no tenderness.  Neurological: He is alert and oriented to person, place, and time.  Speech clear. Left facial weakness persists. Left hemiparesis with sensory deficits. LUE is 2+/3- deltoid, 3-/5 bic, tric, HI. LLE is 3-/5 HF, KE; ADF tr, APF 1. Sensation 1/2 left arm and leg. Mild left facial weakness, visual fields intact. Decreased insight into deficits Skin: Skin is warm and dry.  Psychiatric: He has a normal mood and affect. His behavior is normal. Judgment and thought content normal.    Assessment/Plan: 1. Functional deficits secondary to right basal ganglia/sub insular IPH  which require 3+ hours per day of interdisciplinary therapy in a comprehensive inpatient rehab setting. Physiatrist is providing close team supervision and 24 hour management of active medical problems listed below. Physiatrist and rehab team continue to assess barriers to discharge/monitor patient progress toward functional and medical goals. FIM: FIM - Bathing Bathing Steps Patient Completed: Chest;Abdomen;Front perineal area;Right Arm;Left upper leg;Buttocks;Left Arm;Right lower leg (including foot);Right upper leg;Left lower leg (including foot) Bathing: 4: Steadying assist  FIM - Upper Body Dressing/Undressing Upper body dressing/undressing steps patient completed: Thread/unthread right sleeve of pullover shirt/dresss;Thread/unthread left sleeve of pullover shirt/dress;Put head through opening of pull over shirt/dress;Pull shirt over trunk Upper body dressing/undressing: 4: Min-Patient completed 75 plus % of tasks FIM - Lower Body Dressing/Undressing Lower body dressing/undressing steps patient completed: Thread/unthread left pants leg;Pull underwear up/down;Thread/unthread right underwear leg;Thread/unthread left underwear leg;Thread/unthread right pants leg;Pull pants up/down Lower body dressing/undressing: 3: Mod-Patient completed 50-74% of tasks        FIM - Bed/Chair Transfer Bed/Chair Transfer: 4: Supine > Sit: Min A (steadying Pt. > 75%/lift 1 leg);3: Chair or W/C > Bed: Mod A (lift or lower assist);3: Bed > Chair or W/C: Mod A (lift or lower assist)  FIM - Locomotion: Wheelchair Locomotion: Wheelchair:  (unable to perform on eval due to BP) FIM - Locomotion: Ambulation Locomotion: Ambulation:  (unable to perform on eval due to BP)  Comprehension Comprehension Mode: Auditory Comprehension: 7-Follows complex conversation/direction: With no assist  Expression Expression Mode: Verbal Expression: 7-Expresses complex ideas: With no assist  Social Interaction  Social  Interaction: 7-Interacts appropriately with others - No medications needed.  Problem Solving Problem Solving: 7-Solves complex problems: Recognizes & self-corrects  Memory Memory: 7-Complete Independence: No helper  Medical Problem List and Plan:  1. DVT Prophylaxis/Anticoagulation: Mechanical: Antiembolism stockings, knee (TED hose) Bilateral lower extremities  Sequential compression devices, below knee Bilateral lower extremities  2. Pain Management:  prn vicodin for headaches. Overall improving.  3. Mood: Will have LCSW follow for evaluation. Poor insight with impaired awareness at this time. Will monitor.  4. Neuropsych: This patient is capable of making decisions on his own behalf.  5. Malignant HTN: meds being adjusted--monitor for effect. May change lopressor to labetalol if bp's persist  LOS (Days) 2 A FACE TO FACE EVALUATION WAS PERFORMED  Jochebed Bills T 07/03/2013 8:40 AM

## 2013-07-03 NOTE — Patient Care Conference (Signed)
Inpatient RehabilitationTeam Conference and Plan of Care Update Date: 07/02/2013   Time: 3:27 PM    Patient Name: Paul Reeves      Medical Record Number: [id number]  Date of Birth: 11-08-1985 Sex: Male         Room/Bed: 4W21C/4W21C-01 Payor Info: Payor: [employer]UE [employer] BLUE [employer]IELD / Plan: BCBS PPO OUT OF STATE / Product Type: *No Product type* /    Admitting Diagnosis: R BG SUBINSULAR ICH  Admit Date/Time:  07/01/2013  5:56 PM Admission Comments: No comment available   Primary Diagnosis:  Intracranial hemorrhage Principal Problem: Intracranial hemorrhage  Patient Active Problem List   Diagnosis Date Noted  . 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 Hypokalemia 07/01/2013  . Intracranial hemorrhage 07/01/2013    Expected Discharge Date: Expected Discharge Date: 07/16/13  Team Members Present: Physician leading conference: Dr. Faith Rogue Social Worker Present: Amada Jupiter, LCSW Nurse Present: Leland Johns, RN PT Present: Zerita Boers, PT OT Present: Donzetta Kohut, OT;Jennifer Katrinka Blazing, OT SLP Present: Feliberto Gottron, SLP PPS Coordinator present : Tora Duck, RN, CRRN;Becky Henrene Dodge, PT     Current Status/Progress Goal Weekly Team Focus  Medical   right BG and subinsluar hemorrhage, htn, headaches  control pain and bp to allow participation in therapy  symptom mgt, secondary prevention risk fx for ICH   Bowel/Bladder   pt contient of bowel and bladder  Remain cont of bowel and bladder  Continue to monitor   Swallow/Nutrition/ Hydration   eval pending         ADL's   mod-min A  mod I  NMR LUE, dynamic standing balance, functional transfers   Mobility   mod A  supervision, mod I w/c  activity tolerance   Communication   eval pending          Safety/Cognition/ Behavioral Observations  eval pending         Pain   pt denies pain     Continue to assess   Skin   Skin intact  Skin to remain intact  Continue to assess    Rehab Goals Patient on target to meet rehab goals:  Yes *See Care Plan and progress notes for long and short-term goals.  Barriers to Discharge: left HP,     Possible Resolutions to Barriers:  NMR, adaptive equipemtn    Discharge Planning/Teaching Needs:  home with family to try and provide 24/7 assistance shared mostly between pt's mother and sister      Team Discussion:  New eval today.  Evaluated by ST and no f/u recommended.  Left hemiparesis noted. Significant BP issues which are limiting therapy sessions.  Fewer HA.  Hopeful for supervision to modified independent goals.  Revisions to Treatment Plan:  None   Continued Need for Acute Rehabilitation Level of Care: The patient requires daily medical management by a physician with specialized training in physical medicine and rehabilitation for the following conditions: Daily direction of a multidisciplinary physical rehabilitation program to ensure safe treatment while eliciting the highest outcome that is of practical value to the patient.: Yes Daily medical management of patient stability for increased activity during participation in an intensive rehabilitation regime.: Yes Daily analysis of laboratory values and/or radiology reports with any subsequent need for medication adjustment of medical intervention for : Neurological problems;Other  [name] 07/03/2013, 5:37 PM

## 2013-07-03 NOTE — Progress Notes (Signed)
Physical Therapy Session Note  Patient Details  Name: Paul Reeves MRN: [id number] Date of Birth: [birth year]  Today's Date: 07/03/2013 Time: 0830-0858 Time Calculation (min): 28 min  Short Term Goals: Week 1:  PT Short Term Goal 1 (Week 1): Pt will perform functional transfers with min A PT Short Term Goal 2 (Week 1): Pt will gait with mod A in controlled environment 25'  Skilled Therapeutic Interventions/Progress Updates:   Pt received sitting in recliner in room.  Resting BP was 158/104, therefore initiated PNF D1 flex/ext patterns with AROM.  Did not encourage resistive movements due to elevated BP.  Rechecked BP following PNF patterns and read 153/99, therefore initiated sit <> stand at mod assist.  Provided manual facilitation for forward weight shift and scooting to edge of chair.  Pt somewhat impulsive when scooting, therefore provided cues for controlled scooting.  BP following standing was 169/112, therefore allowed pt to rest for several minutes then BP read 158/99, then read 151/108, therefore performed stand pivot transfer recliner to w/c at mod assist with manual facilitation for weight shifting R and L, esp L for increased WB through LLE to advance RLE.  Note pt has good control at knee and did not notice any buckling.  Following transfer, BP was 156/111, therefore allowed to rest before returning to recliner. Performed stand pivot again to recliner at mod assist with same cues mentioned above and end resting BP was 151/108.  RN made aware.  All needs left in place.   Therapy Documentation Precautions:  Precautions Precautions: Fall Precaution Comments: HIGH BLOOD PRESSURE!!! Restrictions Weight Bearing Restrictions: No Vital Signs: Therapy Vitals Temp: 98 F (36.7 C) Temp src: Oral Pulse Rate: 65 Resp: 18 BP: 157/105 mmHg Patient Position, if appropriate: Sitting Oxygen Therapy SpO2: 99 % O2 Device: None (Room air) Pain:  Pt with no pain this morning during  session.    See FIM for current functional status  Therapy/Group: Individual Therapy  Vista Deck 07/03/2013, 10:45 AM

## 2013-07-03 NOTE — Progress Notes (Signed)
Physical Therapy Note  Patient Details  Name: Paul Reeves MRN: [id number] Date of Birth: Jul 07, 1986 Today's Date: 07/03/2013  Time: 730-800 30 minutes  1:1 No c/o pain.  Pt found supine in bed, BP: 165/112.  RN made aware, PT to check back later.  At 8:00 BP 165/106 supine.  Transferred SPT with mod A with cuing to increase wt bearing on L LE.  Seated in recliner BP: 175/115.  Pt left to sit and eat breakfast in recliner.  RN and PA made aware.   Cordaryl Decelles 07/03/2013, 8:22 AM

## 2013-07-04 ENCOUNTER — Inpatient Hospital Stay (HOSPITAL_COMMUNITY): Payer: BC Managed Care – PPO | Admitting: Physical Therapy

## 2013-07-04 ENCOUNTER — Inpatient Hospital Stay (HOSPITAL_COMMUNITY): Payer: BC Managed Care – PPO | Admitting: Occupational Therapy

## 2013-07-04 MED ORDER — LABETALOL HCL 100 MG PO TABS
100.0000 mg | ORAL_TABLET | Freq: Two times a day (BID) | ORAL | Status: DC
  Administered 2013-07-04 (×2): 100 mg via ORAL
  Filled 2013-07-04 (×5): qty 1

## 2013-07-04 NOTE — IPOC Note (Signed)
Overall Plan of Care [facility]) Patient Details Name: Paul Reeves MRN: [id number] DOB: 1986-04-17  Admitting Diagnosis: R BG SUBINSULAR ICH  Hospital Problems: Principal Problem:   Intracranial hemorrhage Active Problems:   Malignant hypertension     Functional Problem List: Nursing Medication Management;Motor;Safety;Endurance  PT Balance;Endurance;Motor;Sensory;Perception  OT Balance;Endurance;Motor;Safety  SLP    TR         Basic ADL's: OT Eating;Grooming;Bathing;Dressing;Toileting     Advanced  ADL's: OT       Transfers: PT Bed Mobility;Bed to Chair;Car;Furniture  OT Toilet;Tub/Shower     Locomotion: PT Ambulation;Wheelchair Mobility;Stairs     Additional Impairments: OT Fuctional Use of Upper Extremity  SLP        TR      Anticipated Outcomes Item Anticipated Outcome  Self Feeding mod I  Swallowing      Basic self-care  mod I  Toileting  mod I   Bathroom Transfers mod I  Bowel/Bladder  Mod independent  Transfers  mod I  Locomotion  mod I w/c, min A gait  Communication     Cognition     Pain  pain less than 2  Safety/Judgment  mod independent   Therapy Plan: PT Intensity: Minimum of 1-2 x/day ,45 to 90 minutes PT Frequency: 5 out of 7 days PT Duration Estimated Length of Stay: 10-14 days OT Intensity: Minimum of 1-2 x/day, 45 to 90 minutes OT Frequency: 5 out of 7 days         Team Interventions: Nursing Interventions Patient/Family Education;Disease Management/Prevention;Medication Management;Psychosocial Support;Discharge Planning  PT interventions Ambulation/gait training;Discharge planning;Functional mobility training;Therapeutic Activities;Therapeutic Exercise;Wheelchair propulsion/positioning;Balance/vestibular training;Neuromuscular re-education;Pain management;Splinting/orthotics;UE/LE Strength taining/ROM;DME/adaptive equipment instruction;Community reintegration;Patient/family education;Stair training;Functional electrical  stimulation;UE/LE Coordination activities  OT Interventions Balance/vestibular training;Disease mangement/prevention;Neuromuscular re-education;Patient/family education;Self Care/advanced ADL retraining;Therapeutic Exercise;UE/LE Coordination activities;UE/LE Strength taining/ROM;Therapeutic Activities;Functional mobility training;DME/adaptive equipment instruction  SLP Interventions    TR Interventions    SW/CM Interventions Discharge Planning;Psychosocial Support;Patient/Family Education    Team Discharge Planning: Destination: PT-Home ,OT- Home , SLP-Home Projected Follow-up: PT-Outpatient PT, OT-  Outpatient OT, SLP-None Projected Equipment Needs: PT- , OT- Tub/shower bench, SLP-None recommended by SLP Patient/family involved in discharge planning: PT- Patient,  OT-Patient, SLP-Patient  MD ELOS: 2 weeks Medical Rehab Prognosis:  Excellent Assessment: The patient has been admitted for CIR therapies. The team will be addressing, functional mobility, strength, stamina, balance, safety, adaptive techniques/equipment, self-care, bowel and bladder mgt, patient and caregiver education, NMR, visual spatial awareness. Goals have been set at Paul Fishman, MD, [facility]      See Team Conference Notes for weekly updates to the plan of care

## 2013-07-04 NOTE — Progress Notes (Signed)
Occupational Therapy Session Note  Patient Details  Name: Paul Reeves MRN: [id number] Date of Birth: 08-04-1986  Today's Date: 07/04/2013 Time: 1000-1100 Time Calculation (min): 60 min  Short Term Goals: Week 1:  OT Short Term Goal 1 (Week 1): Pt will complete tub/shower transfer with min A OT Short Term Goal 2 (Week 1): Pt will complete LB dressing tasks with min A. OT Short Term Goal 3 (Week 1): Pt will utilize LUE to bathe 50% of body  OT Short Term Goal 4 (Week 1): Pt will require no more than steadying assist for dynamic standing during clothing management.   Skilled Therapeutic Interventions/Progress Updates:      Pt seen for BADL retraining of bathing and dressing with a focus on use of LUE, trunk control in sitting, standing balance with RUE support and hemi-dressing techniques. At start of session, pt's BP was 128/84.  He was already seated in w/c and pt wanted to shower.  He completed a stand pivot into shower toward his left side with mod assist, but pt moved very quickly and almost lost his balance onto left side. This clinician stopped the fall. It was recommended pt complete a squat pivot out of shower, which was much safer.  On tub bench, pt able to bathe most of his body.  He attempted standing to wash buttock but he was too unstable, so instead he was able to weight shift to left in sitting with min assist to support balance.  He needed min cues to start shirt with hemi dressing technique and was able to don pants over feet.  Again he was not able to stand securely enough to pull pants up by himself, but was able to hold a static posture to allow the therapist to assist him.  Pt was able to use his LUE to wash part of his right arm and pull shirt up over right arm.  After session was complete, pt's blood pressure was 147/102 and his next therapist had arrived.  Therapy Documentation Precautions:  Precautions Precautions: Fall Precaution Comments: HIGH BLOOD  PRESSURE!!! Restrictions Weight Bearing Restrictions: No    Vital Signs: Therapy Vitals Temp: 97.8 F (36.6 C) Temp src: Oral Pulse Rate: 61 Resp: 18 BP: 128/84 mmHg Patient Position, if appropriate: Sitting Oxygen Therapy SpO2: 99 % O2 Device: None (Room air) Pain: Pain Assessment Pain Assessment: No/denies pain ADL:  See FIM for current functional status  Therapy/Group: Individual Therapy  Winona Sison 07/04/2013, 11:38 AM

## 2013-07-04 NOTE — Progress Notes (Signed)
Social Work Patient ID: Paul Reeves, male   DOB: 1985-09-29, 29 y.o.   MRN: [id number]  Met with patient yesterday to review team conference. Pt aware and agreeable with targeted d/c date of 10/21with supervision to modified independent goals overall.  Pt continues to be concerned about his BP, however, remains motivated and optimistic.  Good family support. Continue to follow.  Weltha Cathy, LCSW

## 2013-07-04 NOTE — Progress Notes (Signed)
Physical Therapy Note  Patient Details  Name: Paul Reeves MRN: [id number] Date of Birth: July 03, 1986 Today's Date: 07/04/2013  Time: 800-855 55 minutes  1:1 No c/o pain.  BP supine: 164/107.  Pt supine to sit with supervision.  Sit to stand with min A.  Standing NMR x 2 minutes focusing on L LE strength in stance for pre gait activities with tactile cues at glutes and knee.  BP after standing 172/120.  Pt returned to seated x 5 minutes, BP 169/115.  Per PA ok to attempt gentle exercise.  Pt transferred to w/c SPT with mod A, cues for wt shifts onto L LE.  W/c mobility performed throughout unit with min A initially for steering, able to progress to supervision for mobility on tile and carpet.  BP after w/c mobility 154/105.  Pt with no signs or symptoms of discomfort or dizziness throughout session.   Jalaysha Skilton 07/04/2013, 8:56 AM

## 2013-07-04 NOTE — Progress Notes (Signed)
Physical Therapy Session Note  Patient Details  Name: Paul Reeves MRN: [id number] Date of Birth: 04-Oct-1985  Today's Date: 07/04/2013 Time: 1330-1415 Time Calculation (min): 45 min  Short Term Goals: Week 1:  PT Short Term Goal 1 (Week 1): Pt will perform functional transfers with min A PT Short Term Goal 2 (Week 1): Pt will gait with mod A in controlled environment 25'  Skilled Therapeutic Interventions/Progress Updates:    Pt received sitting up in w/c, pt reports no pain but c/o nausea and loss of appetite. BP 136/93 HR 63. Reported pt c/o to nsg and nsg consulted with pt. Pt agreeable to continue with therapy session. Sit to stand from w/c with Mod A with c/o dizziness which decreased briefly and then returned pt able to perform 3 trials with mod A for approximately 1 min per trial, BP 142/87 HR 62bpm, 144/98 HR 80 bpm. Reported vitals to nsg. Pt performed w/c mobility and with increased movement pt reported decreased nausea, w/c mobility x 100' with min vcs and visual demo to improve coordination and sequencing of R LE and UE. Gait training at handrail for 30 feet x 2 with w/c close behind with 2nd helper for safety. Pt required min A for proper advancement and foot placement of L foot about 50% of the time, the other 50% pt displayed good placement and fair weight shift to L LE. Pt did not c/o of any symptoms with gait and stated he felt very encouraged to walk. Continued with monitoring of BP after 2 gait trials, 155/97 HR 68 and 154/ 95 HR 70 bpm. Pt education on identifying symptoms of low and high blood pressure with pt able to verbally repeat recommendations. Pt returned to room with pt stating he felt as he could eat lunch now, left in w/c with all needs within reach.   Therapy Documentation Precautions:  Precautions Precautions: Fall Precaution Comments: HIGH BLOOD PRESSURE!!! Restrictions Weight Bearing Restrictions: No Vital Signs: Therapy Vitals Temp: 97.9 F (36.6  C) Temp src: Oral Pulse Rate: 63 Resp: 18 BP: 136/93 mmHg at initiation of treatment session. Patient Position, if appropriate: Sitting Oxygen Therapy SpO2: 100 % O2 Device: None (Room air) Pain: No c/o pain/ denies pain     Locomotion : Ambulation Ambulation/Gait Assistance: 3: Mod assist (+2 for w/c behind for safety)                See FIM for current functional status  Therapy/Group: Individual Therapy  Jackelyn Knife 07/04/2013, 3:23 PM

## 2013-07-04 NOTE — Progress Notes (Signed)
Subjective/Complaints: Had a long night because of noise across the hall. A little slow in waking up today. .  A 12 point review of systems has been performed and if not noted above is otherwise negative.   Objective: Vital Signs: Blood pressure 137/93, pulse 61, temperature 98.1 F (36.7 C), temperature source Oral, resp. rate 18, weight 96.163 kg (212 lb), SpO2 99.00%. No results found.  Recent Labs  07/02/13 0550  WBC 7.4  HGB 15.9  HCT 45.3  PLT 197    Recent Labs  07/02/13 0550  NA 142  K 3.9  CL 110  GLUCOSE 91  BUN 20  CREATININE 1.34  CALCIUM 8.7   CBG (last 3)  No results found for this basename: GLUCAP,  in the last 72 hours  Wt Readings from Last 3 Encounters:  07/03/13 96.163 kg (212 lb)  06/26/13 101.9 kg (224 lb 10.4 oz)    Physical Exam:  Nursing note and vitals reviewed.  Constitutional: He is oriented to person, place, and time. He appears well-developed and well-nourished.  HENT:  Head: Normocephalic and atraumatic.  Eyes: Conjunctivae are normal. Pupils are equal, round, and reactive to light.  Neck: Normal range of motion. Neck supple.  Cardiovascular: Normal rate and regular rhythm. No murmur  Pulmonary/Chest: Effort normal and breath sounds normal. No respiratory distress. He has no wheezes.  Abdominal: Soft. Bowel sounds are normal. He exhibits no distension. There is tenderness.  Musculoskeletal: He exhibits no edema and no tenderness.  Neurological: He is alert and oriented to person, place, and time.  Speech clear. Left facial weakness persists. Left hemiparesis with sensory deficits. LUE is 2+/3- deltoid, 3-/5 bic, tric, HI. LLE is 3-/5 HF, KE; ADF tr, APF 1. Sensation 1/2 left arm and leg. Mild left facial weakness, visual fields intact. Decreased insight into deficits Skin: Skin is warm and dry.  Psychiatric: He has a normal mood and affect. His behavior is normal. Judgment and thought content normal.    Assessment/Plan: 1.  Functional deficits secondary to right basal ganglia/sub insular IPH which require 3+ hours per day of interdisciplinary therapy in a comprehensive inpatient rehab setting. Physiatrist is providing close team supervision and 24 hour management of active medical problems listed below. Physiatrist and rehab team continue to assess barriers to discharge/monitor patient progress toward functional and medical goals. FIM: FIM - Bathing Bathing Steps Patient Completed: Chest;Abdomen;Front perineal area;Right Arm;Left upper leg;Buttocks;Left Arm;Right lower leg (including foot);Right upper leg;Left lower leg (including foot) Bathing: 4: Steadying assist  FIM - Upper Body Dressing/Undressing Upper body dressing/undressing steps patient completed: Thread/unthread right sleeve of pullover shirt/dresss;Thread/unthread left sleeve of pullover shirt/dress;Put head through opening of pull over shirt/dress;Pull shirt over trunk Upper body dressing/undressing: 4: Min-Patient completed 75 plus % of tasks FIM - Lower Body Dressing/Undressing Lower body dressing/undressing steps patient completed: Thread/unthread right underwear leg;Thread/unthread left underwear leg;Pull underwear up/down;Thread/unthread right pants leg;Thread/unthread left pants leg;Pull pants up/down;Don/Doff right sock;Don/Doff left sock Lower body dressing/undressing: 4: Min-Patient completed 75 plus % of tasks  FIM - Toileting Toileting: 7: Independent: No helper, no device     FIM - Banker Devices: Arm rests Bed/Chair Transfer: 3: Bed > Chair or W/C: Mod A (lift or lower assist);4: Chair or W/C > Bed: Min A (steadying Pt. > 75%)  FIM - Locomotion: Wheelchair Locomotion: Wheelchair: 1: Total Assistance/staff pushes wheelchair (Pt<25%) FIM - Locomotion: Ambulation Locomotion: Ambulation: 0: Activity did not occur  Comprehension Comprehension Mode: Auditory Comprehension: 7-Follows complex  conversation/direction: With no assist  Expression Expression Mode: Verbal Expression: 7-Expresses complex ideas: With no assist  Social Interaction Social Interaction: 7-Interacts appropriately with others - No medications needed.  Problem Solving Problem Solving: 7-Solves complex problems: Recognizes & self-corrects  Memory Memory: 7-Complete Independence: No helper  Medical Problem List and Plan:  1. DVT Prophylaxis/Anticoagulation: Mechanical: Antiembolism stockings, knee (TED hose) Bilateral lower extremities  Sequential compression devices, below knee Bilateral lower extremities  2. Pain Management:  prn vicodin for headaches. Overall improving.  3. Mood: Will have LCSW follow for evaluation. Poor insight with impaired awareness at this time. Will monitor.  4. Neuropsych: This patient is capable of making decisions on his own behalf.  5. Malignant HTN: meds being adjusted--monitor for effect. Change to labetalol today  LOS (Days) 3 A FACE TO FACE EVALUATION WAS PERFORMED  Sherria Riemann T 07/04/2013 8:25 AM

## 2013-07-04 NOTE — Progress Notes (Signed)
Occupational Therapy Session Note  Patient Details  Name: Paul Reeves MRN: [id number] Date of Birth: 1986-04-18  Today's Date: 07/04/2013 Time: 1105-1130 Time Calculation (min): 25 min  Short Term Goals: Week 1:  OT Short Term Goal 1 (Week 1): Pt will complete tub/shower transfer with min A OT Short Term Goal 2 (Week 1): Pt will complete LB dressing tasks with min A. OT Short Term Goal 3 (Week 1): Pt will utilize LUE to bathe 50% of body  OT Short Term Goal 4 (Week 1): Pt will require no more than steadying assist for dynamic standing during clothing management.   Skilled Therapeutic Interventions/Progress Updates:  Individual Therapy No complaints of pain Patient found seated in w/c, finishing up with bathing & dressing session. Patient's BP =147/102. Patient propelled self -> therapy gym and BP=149/102. Patient stood for 2 minutes with complaints of dizziness, BP=169/116. Patient sat in w/c and BP decreased to 152/102. Patient then engaged in functional use of LUE. Therapist assisted patient back to room and left patient seated in w/c with call bell & phone within reach.   Precautions:  Precautions Precautions: Fall Precaution Comments: HIGH BLOOD PRESSURE!!! Restrictions Weight Bearing Restrictions: No  See FIM for current functional status  Therapy/Group: Individual Therapy  Allysa Governale 07/04/2013, 11:32 AM

## 2013-07-04 NOTE — Progress Notes (Signed)
[facility] Individual Statement of Services  Patient Name:  Ramin Vassell  Date:  07/04/2013  [facility] the [facility].  Our goal is to provide you with an individualized program based on your diagnosis and situation, designed to meet your specific needs.  With this comprehensive rehabilitation program, you will be expected to participate in at least 3 hours of rehabilitation therapies Monday-Friday, with modified therapy programming on the weekends.  Your rehabilitation program will include the following services:  Physical Therapy (PT), Occupational Therapy (OT), Speech Therapy (ST), 24 hour per day rehabilitation nursing, Neuropsychology, Case Management (Social Worker), Rehabilitation Medicine, Nutrition Services and Pharmacy Services  Weekly team conferences will be held on Tuesdays to discuss your progress.  Your Social Worker will talk with you frequently to get your input and to update you on team discussions.  Team conferences with you and your family in attendance may also be held.  Expected length of stay: 2 weeks Overall anticipated outcome: supervision to modified independent  Depending on your progress and recovery, your program may change. Your Social Worker will coordinate services and will keep you informed of any changes. Your Social Worker's name and contact numbers are listed  below.  The following services may also be recommended but are not provided by the [facility]:   Driving Evaluations  Home Health Rehabiltiation Services  Outpatient Rehabilitation Services  Vocational Rehabilitation   Arrangements will be made to provide these services after discharge if needed.  Arrangements include referral to agencies that provide these services.  Your insurance has been verified to be:  [employer] primary doctor is:  Dr. Lacretia Nicks. Holley Bouche  Pertinent information will be shared with your doctor and  your insurance company.  Social Worker:  [place], [place] [phone] or ([phone]   Information discussed with and copy given to patient by: Amada Jupiter, 07/04/2013, 10:13 AM

## 2013-07-05 ENCOUNTER — Inpatient Hospital Stay (HOSPITAL_COMMUNITY): Payer: BC Managed Care – PPO | Admitting: *Deleted

## 2013-07-05 ENCOUNTER — Inpatient Hospital Stay (HOSPITAL_COMMUNITY): Payer: BC Managed Care – PPO | Admitting: Physical Therapy

## 2013-07-05 DIAGNOSIS — I619 Nontraumatic intracerebral hemorrhage, unspecified: Secondary | ICD-10-CM

## 2013-07-05 DIAGNOSIS — I10 Essential (primary) hypertension: Secondary | ICD-10-CM

## 2013-07-05 MED ORDER — LABETALOL HCL 200 MG PO TABS
200.0000 mg | ORAL_TABLET | Freq: Two times a day (BID) | ORAL | Status: DC
  Administered 2013-07-05: 200 mg via ORAL
  Filled 2013-07-05 (×4): qty 1

## 2013-07-05 NOTE — Progress Notes (Signed)
Subjective/Complaints:  sleeping soundly upon entering. Denies any new issues  A 12 point review of systems has been performed and if not noted above is otherwise negative.   Objective: Vital Signs: Blood pressure 158/100, pulse 59, temperature 98.1 F (36.7 C), temperature source Oral, resp. rate 19, weight 96.163 kg (212 lb), SpO2 99.00%. No results found. No results found for this basename: WBC, HGB, HCT, PLT,  in the last 72 hours No results found for this basename: NA, K, CL, CO, GLUCOSE, BUN, CREATININE, CALCIUM,  in the last 72 hours CBG (last 3)  No results found for this basename: GLUCAP,  in the last 72 hours  Wt Readings from Last 3 Encounters:  07/03/13 96.163 kg (212 lb)  06/26/13 101.9 kg (224 lb 10.4 oz)    Physical Exam:  Nursing note and vitals reviewed.  Constitutional: He is oriented to person, place, and time. He appears well-developed and well-nourished.  HENT:  Head: Normocephalic and atraumatic.  Eyes: Conjunctivae are normal. Pupils are equal, round, and reactive to light.  Neck: Normal range of motion. Neck supple.  Cardiovascular: Normal rate and regular rhythm. No murmur  Pulmonary/Chest: Effort normal and breath sounds normal. No respiratory distress. He has no wheezes.  Abdominal: Soft. Bowel sounds are normal. He exhibits no distension. There is tenderness.  Musculoskeletal: He exhibits no edema and no tenderness.  Neurological: He is alert and oriented to person, place, and time.  Speech clear. Left facial weakness persists but better. Left hemiparesis with sensory deficits. LUE is 2+/3- deltoid, 3-/5 bic, tric, HI. LLE is 3-/5 HF, KE; ADF tr, APF 1. Sensation 1/2 left arm and leg. Mild left facial weakness, visual fields intact. Decreased insight into deficits Skin: Skin is warm and dry.  Psychiatric: He has a normal mood and affect. His behavior is normal. Judgment and thought content normal.    Assessment/Plan: 1. Functional deficits secondary  to right basal ganglia/sub insular IPH which require 3+ hours per day of interdisciplinary therapy in a comprehensive inpatient rehab setting. Physiatrist is providing close team supervision and 24 hour management of active medical problems listed below. Physiatrist and rehab team continue to assess barriers to discharge/monitor patient progress toward functional and medical goals. FIM: FIM - Bathing Bathing Steps Patient Completed: Chest;Abdomen;Front perineal area;Left upper leg;Buttocks;Left Arm;Right lower leg (including foot);Right upper leg;Left lower leg (including foot) Bathing: 4: Min-Patient completes 8-9 66f 10 parts or 75+ percent  FIM - Upper Body Dressing/Undressing Upper body dressing/undressing steps patient completed: Thread/unthread right sleeve of pullover shirt/dresss;Thread/unthread left sleeve of pullover shirt/dress;Put head through opening of pull over shirt/dress;Pull shirt over trunk Upper body dressing/undressing: 5: Supervision: Safety issues/verbal cues FIM - Lower Body Dressing/Undressing Lower body dressing/undressing steps patient completed: Thread/unthread right underwear leg;Thread/unthread left underwear leg;Thread/unthread right pants leg;Thread/unthread left pants leg;Don/Doff right shoe;Fasten/unfasten right shoe Lower body dressing/undressing: 3: Mod-Patient completed 50-74% of tasks  FIM - Toileting Toileting: 7: Independent: No helper, no device     FIM - Banker Devices: Arm rests Bed/Chair Transfer: 3: Bed > Chair or W/C: Mod A (lift or lower assist);3: Chair or W/C > Bed: Mod A (lift or lower assist)  FIM - Locomotion: Wheelchair Locomotion: Wheelchair: 4: Travels 150 ft or more: maneuvers on rugs and over door sillls with minimal assistance (Pt.>75%) FIM - Locomotion: Ambulation Locomotion: Ambulation Assistive Devices: Other (comment) (handrail) Ambulation/Gait Assistance: 3: Mod assist (+2 for w/c behind  for safety) Locomotion: Ambulation: 1: Travels less than 50 ft with  moderate assistance (Pt: 50 - 74%)  Comprehension Comprehension Mode: Auditory Comprehension: 7-Follows complex conversation/direction: With no assist  Expression Expression Mode: Verbal Expression: 7-Expresses complex ideas: With no assist  Social Interaction Social Interaction: 7-Interacts appropriately with others - No medications needed.  Problem Solving Problem Solving: 7-Solves complex problems: Recognizes & self-corrects  Memory Memory: 7-Complete Independence: No helper  Medical Problem List and Plan:  1. DVT Prophylaxis/Anticoagulation: Mechanical: Antiembolism stockings, knee (TED hose) Bilateral lower extremities  Sequential compression devices, below knee Bilateral lower extremities  2. Pain Management:  prn vicodin for headaches. Overall improving.  3. Mood: Will have LCSW follow for evaluation. Poor insight with impaired awareness at this time. Will monitor.  4. Neuropsych: This patient is capable of making decisions on his own behalf.  5. Malignant HTN: increase labetalol to 200mg  bid  LOS (Days) 4 A FACE TO FACE EVALUATION WAS PERFORMED  SWARTZ,ZACHARY T 07/05/2013 8:21 AM

## 2013-07-05 NOTE — Progress Notes (Signed)
Occupational Therapy Session Note  Patient Details  Name: Paul Reeves MRN: [id number] Date of Birth: 21-Nov-1985  Today's Date: 07/05/2013 Time: 0800-0900 Time Calculation: 60 minutes  Short Term Goals: Week 1:  OT Short Term Goal 1 (Week 1): Pt will complete tub/shower transfer with min A OT Short Term Goal 2 (Week 1): Pt will complete LB dressing tasks with min A. OT Short Term Goal 3 (Week 1): Pt will utilize LUE to bathe 50% of body  OT Short Term Goal 4 (Week 1): Pt will require no more than steadying assist for dynamic standing during clothing management.   Skilled Therapeutic Interventions/Progress Updates:    Pt engaged in 1:1 self-care session focused on functional transfers, hemi-dressing techniques.  Pts BP prior to beginning session, seated in w/c upon arrival to room, was 133/68.  Pt reported feeling "sluggish" this a.m.  Stand-pivot transfer from w/c to toilet completed with min A, ambulated from toilet to tub bench. Bathed with supervision/set-up, lateral leans for washing buttocks.  Pt declined trying to stand in shower at this time, didn't feel comfortable just yet.  Dressed from w/c, steadying assist for dynamic standing during clothing management.  Demonstrated day-to-day recall of hemi-dressing technique.  Pt tends to be quick when attempting to stand for transfers. Eating breakfast from w/c, all needs within reach at conclusion of session.  BP 151/86 post session.    Therapy Documentation Precautions:  Precautions Precautions: Fall Precaution Comments: HIGH BLOOD PRESSURE!!! Restrictions Weight Bearing Restrictions: No   Vitals: See above.   Pain: None.   See FIM for current functional status  Therapy/Group: Individual Therapy  Kandis Ban 07/05/2013, 9:56 AM

## 2013-07-05 NOTE — Progress Notes (Addendum)
Occupational Therapy Note   Patient Details Name: Dixon Luczak MRN: [id number] Date of Birth: 1986-04-04 Today's Date: 07/05/2013  Time:1100-1130  (30 min)  1st session Pain:none Individual session  1st session:  Focus of treatment:  Transfers, LUE NMRE, Sitting balance, postural control.  BP=  170/120 after propelling wc from room to CVA gym.  Allowed increased time to rest.  Transferred to mat with minimal assist.  Provided physical cues for LUE depression during UE activities and also for good posture alignment.  Pt. Transferred back to wc with Squat pivot transfer and propelled back to room.  BP= 150/115.  Informed nurse and nurse tech.  Transferred back to recliner.    Time: 1430-1500  (30 min)  2nd session Pain: occasional right side pain when using LUE in activities Individual session  2nd session:  Treatment focused on transfers, LUE NMRE, sitting balance, postural control.  Transferred from recliner to wc with minimal assist.  Demonstrated how to place foot pedal on and take off.  Propelled wc to gym and transferred to mat.  Performed LUE reaching and placing of cups, ball.  Required cues to move in normal patterns with more external rotation, adduction.  Pt had some right side pain while working LUE.  He stated this is the only time it bothers him. Transferred back to wc and propelled self to room.  Transferred back to recliner with minimal assist.  Left pt in recline rwith call bell,phone within reach.       Humberto Seals 07/05/2013, 12:37 PM

## 2013-07-05 NOTE — Progress Notes (Signed)
Note has been reviewed and this clinician agrees with the information.

## 2013-07-05 NOTE — Progress Notes (Signed)
Physical Therapy Note  Patient Details  Name: Paul Reeves MRN: [id number] Date of Birth: [birth year] Today's Date: 07/05/2013  Time 1: 730-755 25 minutes  1:1 no c/o pain, pt c/o fatigue, lethargic during session.  Treatment focused on NMR for L LE during stance.  Mini squats and sit to stands without UE support with manual facilitation for equal wt bearing B LEs, wt shifts to L, facilitation at glutes and knee.  Pre gait stepping with tactile cues at glutes for contraction during stance.  Pt responds well to tactile cues.  SPT with mod A.  BP supine: 166/100, BP after standing 155/102.  Time 2: 900-945 45 minutes  1:1 No c/o pain.  W/c mobility throughout unit with supervision.  Session focused on gait training with RW with L wrist support. Pt gait multiple reps of 20-30' with mod A.  Manual facilitation for wt shifts, trunk control, tactile cues at glutes for contraction in L stance.  Pt able to advance L LE and adjust foot placement without cues.  Pt requires assist to steer RW and for trunk control.  Stair negotiation x 6 stairs with mod A, cues for sequencing and safety.  Pt reported dizziness at end of session, BP 154/94.  Pt returned to room and SPT with mod A to recliner to rest.   Linden Mikes 07/05/2013, 7:55 AM

## 2013-07-06 ENCOUNTER — Inpatient Hospital Stay (HOSPITAL_COMMUNITY): Payer: BC Managed Care – PPO | Admitting: *Deleted

## 2013-07-06 DIAGNOSIS — I629 Nontraumatic intracranial hemorrhage, unspecified: Secondary | ICD-10-CM

## 2013-07-06 DIAGNOSIS — E876 Hypokalemia: Secondary | ICD-10-CM

## 2013-07-06 DIAGNOSIS — I10 Essential (primary) hypertension: Secondary | ICD-10-CM

## 2013-07-06 MED ORDER — LABETALOL HCL 300 MG PO TABS
300.0000 mg | ORAL_TABLET | Freq: Two times a day (BID) | ORAL | Status: DC
  Administered 2013-07-06 – 2013-07-08 (×4): 300 mg via ORAL
  Filled 2013-07-06 (×6): qty 1

## 2013-07-06 NOTE — Progress Notes (Signed)
Occupational Therapy Note Patient Details  Name: Jovon Winterhalter MRN: [id number] Date of Birth: 1986-06-15 Today's Date: 07/06/2013  Time: 1430-1515  (45 min) Pain:none Individual session  Pt.lying in bed upon OT arrival.  Mother, brother, sister present.  Pt. Jumped up from supine to sit and then to standing with fast, impulsive movement.  Stood and pivoted with minimal assist.  Reminded pt to let caregiver know when he is about to stand and pivot.  Pt propelled self to gym.  BP= 155/97.  Stood at [employer] Lo table for 5 minutes while doing moderate complex design.  Pt had difficulty with size matching.  He often picked pipes too large, too long.  Demonstrated sizing the pipe next to the one that fit to ensure it was correct size.  Pt more successful with correct choice after that.  BP after standing was 154/100.  Used LUE as gross assist during assemblage.  Needed verbal cues to weight bear on LLE and spread feet apart.  Pt propelled wc back to room.   Left pt in wc with call bell,phone within reach.       Humberto Seals 07/06/2013, 4:29 PM

## 2013-07-06 NOTE — Progress Notes (Signed)
Paul Reeves is a 27 y.o. male 1986-04-23 [id number]  Subjective: No new complaints. No new problems. Slept well. Feeling OK.  Objective: Vital signs in last 24 hours: Temp:  [97.2 F (36.2 C)-98.2 F (36.8 C)] 98 F (36.7 C) (10/11 0625) Pulse Rate:  [63-75] 74 (10/11 0625) Resp:  [18-20] 18 (10/11 0625) BP: (150-169)/(94-115) 161/101 mmHg (10/11 0625) SpO2:  [96 %-98 %] 97 % (10/11 0625) Weight change:  Last BM Date: 07/04/13  Intake/Output from previous day: 10/10 0701 - 10/11 0700 In: 720 [P.O.:720] Out: -  Last cbgs: CBG (last 3)  No results found for this basename: GLUCAP,  in the last 72 hours   Physical Exam General: No apparent distress    HEENT: moist mucosa Lungs: Normal effort. Lungs clear to auscultation, no crackles or wheezes. Cardiovascular: Regular rate and rhythm, no edema Abdomen: S/NT/ND; BS(+) Musculoskeletal:  No change from before Neurological: No new neurological deficits Wounds: N/A    Skin: clear Alert, cooperative   Lab Results: BMET    Component Value Date/Time   NA 142 07/02/2013 0550   K 3.9 07/02/2013 0550   CL 110 07/02/2013 0550   CO2 21 07/02/2013 0550   GLUCOSE 91 07/02/2013 0550   BUN 20 07/02/2013 0550   CREATININE 1.34 07/02/2013 0550   CALCIUM 8.7 07/02/2013 0550   GFRNONAA 71* 07/02/2013 0550   GFRAA 82* 07/02/2013 0550   CBC    Component Value Date/Time   WBC 7.4 07/02/2013 0550   RBC 5.15 07/02/2013 0550   HGB 15.9 07/02/2013 0550   HCT 45.3 07/02/2013 0550   PLT 197 07/02/2013 0550   MCV 88.0 07/02/2013 0550   MCH 30.9 07/02/2013 0550   MCHC 35.1 07/02/2013 0550   RDW 12.7 07/02/2013 0550   LYMPHSABS 1.6 07/02/2013 0550   MONOABS 0.6 07/02/2013 0550   EOSABS 0.2 07/02/2013 0550   BASOSABS 0.1 07/02/2013 0550    Studies/Results: No results found.  Medications: I have reviewed the patient's current medications.  Assessment/Plan:   1. DVT Prophylaxis/Anticoagulation: Mechanical: Antiembolism stockings, knee (TED  hose) Bilateral lower extremities  Sequential compression devices, below knee Bilateral lower extremities  2. Pain Management: prn vicodin for headaches. Overall improving.  3. Mood: Will have LCSW follow for evaluation. Poor insight with impaired awareness at this time. Will monitor.  4. Neuropsych: This patient is capable of making decisions on his own behalf.  5. Malignant HTN: increase labetalol to 300mg  bid    Length of stay, days: 5  Sonda Primes , MD 07/06/2013, 8:52 AM

## 2013-07-07 ENCOUNTER — Inpatient Hospital Stay (HOSPITAL_COMMUNITY): Payer: BC Managed Care – PPO | Admitting: *Deleted

## 2013-07-07 ENCOUNTER — Inpatient Hospital Stay (HOSPITAL_COMMUNITY): Payer: BC Managed Care – PPO | Admitting: Physical Therapy

## 2013-07-07 MED ORDER — BENAZEPRIL HCL 10 MG PO TABS
10.0000 mg | ORAL_TABLET | Freq: Two times a day (BID) | ORAL | Status: DC
  Administered 2013-07-07 – 2013-07-19 (×24): 10 mg via ORAL
  Filled 2013-07-07 (×28): qty 1

## 2013-07-07 NOTE — Progress Notes (Signed)
Physical Therapy Session Note  Patient Details  Name: Paul Reeves MRN: [id number] Date of Birth: 17-Apr-1986  Today's Date: 07/07/2013 Time: 7829-5621 Time Calculation (min): 28 min  Short Term Goals: Week 1:  PT Short Term Goal 1 (Week 1): Pt will perform functional transfers with min A PT Short Term Goal 2 (Week 1): Pt will gait with mod A in controlled environment 25'  Skilled Therapeutic Interventions/Progress Updates:  Pt was seen bedside in the pm. Pt propelled w/c 150 feet x 2 with S and verbal cues. Pt ascended/descended 2 stairs x 4 with R rail and min to mod A. BP 137/82  Precautions:  Precautions Precautions: Fall Precaution Comments: HIGH BLOOD PRESSURE!!! Restrictions Weight Bearing Restrictions: No General:   Pain: No c/o pain.    Locomotion : Ambulation Ambulation/Gait Assistance: 4: Min assist   See FIM for current functional status  Therapy/Group: Individual Therapy  Rayford Halsted 07/07/2013, 2:41 PM

## 2013-07-07 NOTE — Progress Notes (Signed)
Physical Therapy Note  Patient Details  Name: Lex Linhares MRN: [id number] Date of Birth: 10-14-1985 Today's Date: 07/07/2013  1440-1515 (35 minutes) individual Pain: no reported pain Focus of treatment: Neuro re-ed Left LE Treatment: Pt up in wc on arrival with c/o fatigue and requesting to lay down in bed; pt agreeable to performing Neuro re-ed exercises as follows 3 X 10 reps- AA PNF patterns for left  hip flexion/extension, AA hip abduction, active hip ER ; passive stretch left heel cords; transfers stand /turn min assist . Pt in bed with bed alarm activated.   Hubbard Seldon,JIM 07/07/2013, 3:08 PM

## 2013-07-07 NOTE — Progress Notes (Signed)
Occupational Therapy Note  Patient Details  Name: Paul Reeves MRN: [id number] Date of Birth: 1985/12/07 Today's Date: 07/07/2013  Time:  1100-1200  (60 min) Pain: none Individual session  Engaged in therapeutic bathing and dressing at shower level.  Pt. Sitting in wc with eyes closed.  He was aroused and gathered clothes at wc level.  Pt transferred from wc to toilet with minimal assist.  Ambulated from toilet to shower stall with mod assist and increased leaning to left.  Pt pivoted to shower bench and had increased leaning to left which OT corrected.  Pt. Transferred out to wc and propelled to sink.  Did sit to stand for pants with left foot slipping and OT assisted pt with LOB. Left pt in wc with call bell,phone within reach.       Humberto Seals 07/07/2013, 12:20 PM

## 2013-07-07 NOTE — Progress Notes (Signed)
Physical Therapy Session Note  Patient Details  Name: Paul Reeves MRN: [id number] Date of Birth: Feb 03, 1986  Today's Date: 07/07/2013 Time: 0900-0957 Time Calculation (min): 57 min  Short Term Goals: Week 1:  PT Short Term Goal 1 (Week 1): Pt will perform functional transfers with min A PT Short Term Goal 2 (Week 1): Pt will gait with mod A in controlled environment 25'  Skilled Therapeutic Interventions/Progress Updates:  Pt was seen bedside in the am, BP supine in bed 166/110. Pt transferred supine to edge of bed with side rail, head of bed elevated and S. BP at edge of bed 148/97. Pt transferred edge of bed to w/c with min to mod A and verbal cues. Pt propelled w/c about 150 feet with R LE and S. Pt ambulated with rolling walker, L hand orthosis, x6 for 30 feet each time with min A and verbal cues for technique and safety. Pt propelled w/c 150 feet with R LE and S. BP following ambulation was 152/95.    Therapy Documentation Precautions:  Precautions Precautions: Fall Precaution Comments: HIGH BLOOD PRESSURE!!! Restrictions Weight Bearing Restrictions: No General:   Pain: Pain Assessment Pain Assessment: No/denies pain Mobility:   Locomotion : Ambulation Ambulation/Gait Assistance: 4: Min assist    Other Treatments:    See FIM for current functional status  Therapy/Group: Individual Therapy  Rayford Halsted 07/07/2013, 12:36 PM

## 2013-07-07 NOTE — Progress Notes (Signed)
Paul Reeves is a 27 y.o. male [birth year] [id number]  Subjective: No new complaints. No new problems. Slept well. Feeling OK.  Objective: Vital signs in last 24 hours: Temp:  [97.9 F (36.6 C)-98 F (36.7 C)] 98 F (36.7 C) (10/12 0522) Pulse Rate:  [57-79] 57 (10/12 0629) Resp:  [18] 18 (10/12 0522) BP: (145-180)/(83-112) 170/106 mmHg (10/12 0629) SpO2:  [96 %-98 %] 98 % (10/12 0522) Weight change:  Last BM Date: 07/06/13  Intake/Output from previous day: 10/11 0701 - 10/12 0700 In: 360 [P.O.:360] Out: -  Last cbgs: CBG (last 3)  No results found for this basename: GLUCAP,  in the last 72 hours   Physical Exam General: No apparent distress    HEENT: moist mucosa Lungs: Normal effort. Lungs clear to auscultation, no crackles or wheezes. Cardiovascular: Regular rate and rhythm, no edema Abdomen: S/NT/ND; BS(+) Musculoskeletal:  No change from before Neurological: No new neurological deficits Wounds: N/A    Skin: clear Alert, cooperative   Lab Results: BMET    Component Value Date/Time   NA 142 07/02/2013 0550   K 3.9 07/02/2013 0550   CL 110 07/02/2013 0550   CO2 21 07/02/2013 0550   GLUCOSE 91 07/02/2013 0550   BUN 20 07/02/2013 0550   CREATININE 1.34 07/02/2013 0550   CALCIUM 8.7 07/02/2013 0550   GFRNONAA 71* 07/02/2013 0550   GFRAA 82* 07/02/2013 0550   CBC    Component Value Date/Time   WBC 7.4 07/02/2013 0550   RBC 5.15 07/02/2013 0550   HGB 15.9 07/02/2013 0550   HCT 45.3 07/02/2013 0550   PLT 197 07/02/2013 0550   MCV 88.0 07/02/2013 0550   MCH 30.9 07/02/2013 0550   MCHC 35.1 07/02/2013 0550   RDW 12.7 07/02/2013 0550   LYMPHSABS 1.6 07/02/2013 0550   MONOABS 0.6 07/02/2013 0550   EOSABS 0.2 07/02/2013 0550   BASOSABS 0.1 07/02/2013 0550    Studies/Results: No results found.  Medications: I have reviewed the patient's current medications.  Assessment/Plan:   1. DVT Prophylaxis/Anticoagulation: Mechanical: Antiembolism stockings, knee (TED hose)  Bilateral lower extremities  Sequential compression devices, below knee Bilateral lower extremities  2. Pain Management: prn vicodin for headaches. Overall improving.  3. Mood: Will have LCSW follow for evaluation. Poor insight with impaired awareness at this time. Will monitor.  4. Neuropsych: This patient is capable of making decisions on his own behalf.  5. Malignant HTN: increase labetalol to 300mg  bid. Increased Benazepril to 10 mg bid    Length of stay, days: 6  Sonda Primes , MD 07/07/2013, 8:32 AM

## 2013-07-08 ENCOUNTER — Inpatient Hospital Stay (HOSPITAL_COMMUNITY): Payer: BC Managed Care – PPO | Admitting: Occupational Therapy

## 2013-07-08 ENCOUNTER — Encounter (HOSPITAL_COMMUNITY): Payer: BC Managed Care – PPO | Admitting: Occupational Therapy

## 2013-07-08 ENCOUNTER — Inpatient Hospital Stay (HOSPITAL_COMMUNITY): Payer: BC Managed Care – PPO | Admitting: Physical Therapy

## 2013-07-08 LAB — METANEPHRINES, URINE, 24 HOUR
Metaneph Total, Ur: 415 mcg/24 h (ref 94–604)
Metanephrines, Ur: 166 mcg/24 h (ref 25–222)
Normetanephrine, 24H Ur: 249 mcg/24 h (ref 40–412)
Volume, Urine-METAN: 900 mL

## 2013-07-08 LAB — CATECHOLAMINES, FRACTIONATED, URINE, 24 HOUR
Catecholamines T: 53 mcg/24 h (ref 26–121)
Epinephrine 24 Hr Urine: 17 mcg/24 h (ref 2–24)
Norepinephrine 24 Hr Urine: 36 mcg/24 h (ref 15–100)
Total urine volume: 900 mL

## 2013-07-08 MED ORDER — LABETALOL HCL 300 MG PO TABS
300.0000 mg | ORAL_TABLET | Freq: Two times a day (BID) | ORAL | Status: DC
  Administered 2013-07-08 – 2013-07-17 (×17): 300 mg via ORAL
  Filled 2013-07-08 (×20): qty 1

## 2013-07-08 NOTE — Progress Notes (Signed)
Note reviewed and accurately reflects treatment session.   

## 2013-07-08 NOTE — Progress Notes (Signed)
Occupational Therapy Session Note  Patient Details  Name: Paul Reeves MRN: [id number] Date of Birth: April 08, 1986  Today's Date: 07/08/2013 Time:  1535- 1610 Time Calculation: 35 minutes    Short Term Goals: Week 1:  OT Short Term Goal 1 (Week 1): Pt will complete tub/shower transfer with min A OT Short Term Goal 2 (Week 1): Pt will complete LB dressing tasks with min A. OT Short Term Goal 3 (Week 1): Pt will utilize LUE to bathe 50% of body  OT Short Term Goal 4 (Week 1): Pt will require no more than steadying assist for dynamic standing during clothing management.   Skilled Therapeutic Interventions/Progress Updates:    Pt engaged in 1:1 session focused on NMR of RUE, functional transfers.  Pt completed transfers x3, stand-step transfers bed>w/c, w/c<>mat, with min A, one LOB with transfer from mat to w/c.  Engaged in task of BUE's grasping large ball and raising to approximately 90 degrees shoulder flexion with elbows fully extended, focus on slow controlled movements for symmetry,  while decreasing compensatory L shoulder hiking.  Reached to grasp golf ball with LUE and transfer to RUE in transverse plane, focus on in hand manipulation on return reach.  Pt engaged in 2 sizes ball catch and toss with LUE for hand-eye coordination, tossed with LUE in pronated position.  BP 138/70 (manually) and 142/94 (Dynamap) prior to beginning session in seated position.   Therapy Documentation Precautions:  Precautions Precautions: Fall Precaution Comments: HIGH BLOOD PRESSURE!!! Restrictions Weight Bearing Restrictions: No  Pain:  None.    See FIM for current functional status  Therapy/Group: Individual Therapy  Kandis Ban 07/08/2013, 4:12 PM

## 2013-07-08 NOTE — Progress Notes (Signed)
Physical Therapy Note  Patient Details  Name: Paul Reeves MRN: [id number] Date of Birth: 12/29/1985 Today's Date: 07/08/2013  Time 1: 830-925 55 minutes  1:1 No c/o pain.  W/c mobility throughout unit with hemi technique, supervision.  Gait training with L wrap for dorsiflexion, quad cane.  Pt with good upright trunk posture with tactile cues, manual facilitation for wt shifts and grading of movement.  Pt prefers quad cane vs RW.  Multiple attempts at gait with improvement noted with repetition, good sequencing without cuing.  Standing NMR to promote L LE strength in stance.  Step ups front and sideways with R LE with tactile cues at glutes for control.  Side stepping, wt shifting and mini squats.  Pt fatigues easily but is motivated and performs well with frequent rests.  Time 2: 1105-1135 30 minutes  1:1 No c/o pain.  Pt very lethargic/fatigued this session.  Able to propel w/c to gym with increased time.  Functional transfer training with focus on wt bearing through B LEs with min facilitation.  Nu step training for L UE/LE strengthening.  Pt required assist to keep L hand on machine, tactile cues to prevent L hip ER on machine.  Pt able to perform x 4 minutes before fatigued.  Pt reports feeling "queasy and nauseous"  BP 124/84.  RN made aware.  Pt assisted back to room to bed to rest.  Missed 15 minutes skilled PT.   DONAWERTH,KAREN 07/08/2013, 9:26 AM

## 2013-07-08 NOTE — Progress Notes (Signed)
Subjective/Complaints:  "not a morning person"===pain controlled. No specific complaints this am. A 12 point review of systems has been performed and if not noted above is otherwise negative.   Objective: Vital Signs: Blood pressure 158/101, pulse 68, temperature 98.2 F (36.8 C), temperature source Oral, resp. rate 18, weight 96.163 kg (212 lb), SpO2 98.00%. No results found. No results found for this basename: WBC, HGB, HCT, PLT,  in the last 72 hours No results found for this basename: NA, K, CL, CO, GLUCOSE, BUN, CREATININE, CALCIUM,  in the last 72 hours CBG (last 3)  No results found for this basename: GLUCAP,  in the last 72 hours  Wt Readings from Last 3 Encounters:  07/03/13 96.163 kg (212 lb)  06/26/13 101.9 kg (224 lb 10.4 oz)    Physical Exam:  Nursing note and vitals reviewed.  Constitutional: He is oriented to person, place, and time. He appears well-developed and well-nourished.  HENT:  Head: Normocephalic and atraumatic.  Eyes: Conjunctivae are normal. Pupils are equal, round, and reactive to light.  Neck: Normal range of motion. Neck supple.  Cardiovascular: Normal rate and regular rhythm. No murmur  Pulmonary/Chest: Effort normal and breath sounds normal. No respiratory distress. He has no wheezes.  Abdominal: Soft. Bowel sounds are normal. He exhibits no distension. There is tenderness.  Musculoskeletal: He exhibits no edema and no tenderness.  Neurological: He is alert and oriented to person, place, and time.  Speech clear. Left facial weakness persists but better. Left hemiparesis with sensory deficits. LUE is 2+/3- deltoid, 3-/5 bic, tric, HI. LLE is 3-/5 HF, KE; ADF tr, APF 1+. Sensation 1/2 left arm and leg. Mild left facial weakness, visual fields intact.  Skin: Skin is warm and dry.  Psychiatric: He has a normal mood and affect. His behavior is normal. Judgment and thought content normal.    Assessment/Plan: 1. 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 Physiatrist is providing close team supervision and 24 hour management of active medical problems listed below. Physiatrist and rehab team continue to assess barriers to discharge/monitor patient progress toward functional and medical goals. FIM: FIM - Bathing Bathing Steps Patient Completed: Chest;Abdomen;Front perineal area;Left upper leg;Buttocks;Left Arm;Right lower leg (including foot);Right upper leg;Left lower leg (including foot) Bathing: 4: Min-Patient completes 8-9 57f 10 parts or 75+ percent  FIM - Upper Body Dressing/Undressing Upper body dressing/undressing steps patient completed: Thread/unthread right sleeve of pullover shirt/dresss;Thread/unthread left sleeve of pullover shirt/dress;Put head through opening of pull over shirt/dress;Pull shirt over trunk Upper body dressing/undressing: 5: Supervision: Safety issues/verbal cues FIM - Lower Body Dressing/Undressing Lower body dressing/undressing steps patient completed: Thread/unthread right underwear leg;Thread/unthread left underwear leg;Thread/unthread right pants leg;Thread/unthread left pants leg;Don/Doff right shoe;Fasten/unfasten right shoe;Don/Doff left shoe;Fasten/unfasten left shoe;Pull pants up/down;Pull underwear up/down;Don/Doff left sock;Don/Doff right sock Lower body dressing/undressing: 4: Steadying Assist  FIM - Toileting Toileting steps completed by patient: Performs perineal hygiene Toileting Assistive Devices: Grab bar or rail for support Toileting: 2: Max-Patient completed 1 of 3 steps  FIM - Diplomatic Services operational officer Devices: Grab bars Toilet Transfers: 2-To toilet/BSC: Max A (lift and lower assist);3-From toilet/BSC: Mod A (lift or lower assist)  FIM - Bed/Chair Transfer Bed/Chair Transfer Assistive Devices: Arm rests;HOB elevated;Bed rails Bed/Chair  Transfer: 5: Supine > Sit: Supervision (verbal cues/safety issues);3: Bed > Chair or W/C: Mod A (lift or lower assist)  FIM - Locomotion: Wheelchair Locomotion: Wheelchair:  5: Travels 150 ft or more: maneuvers on rugs and over door sills with supervision, cueing or coaxing FIM - Locomotion: Ambulation Locomotion: Ambulation Assistive Devices: Walker - Rolling;Orthosis Ambulation/Gait Assistance: 4: Min assist Locomotion: Ambulation: 1: Travels less than 50 ft with minimal assistance (Pt.>75%)  Comprehension Comprehension Mode: Auditory Comprehension: 7-Follows complex conversation/direction: With no assist  Expression Expression Mode: Verbal Expression: 7-Expresses complex ideas: With no assist  Social Interaction Social Interaction: 7-Interacts appropriately with others - No medications needed.  Problem Solving Problem Solving: 7-Solves complex problems: Recognizes & self-corrects  Memory Memory: 7-Complete Independence: No helper  Medical Problem List and Plan:  1. DVT Prophylaxis/Anticoagulation: Mechanical: Antiembolism stockings, knee (TED hose) Bilateral lower extremities  Sequential compression devices, below knee Bilateral lower extremities  2. Pain Management:  prn vicodin for headaches. Overall improving.  3. Mood: Will have LCSW follow for evaluation. Poor insight with impaired awareness at this time. Will monitor.  4. Neuropsych: This patient is capable of making decisions on his own behalf.  5. Malignant HTN: adjust scheduling of labetalol to account for am htn  LOS (Days) 7 A FACE TO FACE EVALUATION WAS PERFORMED  Lyrick Worland T 07/08/2013 8:22 AM

## 2013-07-08 NOTE — Progress Notes (Signed)
Occupational Therapy Session Note  Patient Details  Name: Paul Reeves MRN: [id number] Date of Birth: [birth year]  Today's Date: 07/08/2013 Time: 0730-0830 Time Calculation (min): 60 min  Short Term Goals: Week 1:  OT Short Term Goal 1 (Week 1): Pt will complete tub/shower transfer with min A OT Short Term Goal 2 (Week 1): Pt will complete LB dressing tasks with min A. OT Short Term Goal 3 (Week 1): Pt will utilize LUE to bathe 50% of body  OT Short Term Goal 4 (Week 1): Pt will require no more than steadying assist for dynamic standing during clothing management.   Skilled Therapeutic Interventions/Progress Updates:    1:1 self care retraining at shower level. Heavy focus on neuro reeducation with all functional mobility and self care tasks. Performed bed mobility without bed rail, sit to stands with right hand on left knee to promote weight bearing through left LE, standing in midline, stand step pivots with attention to mm control on left, heavy use of left UE in bathing and dressing at dominant and non-dominant level with focus on control movements without elevating left shoulder and FMC with extra time. Pt able to participate in opening all containers with left UE for breakfast with extra time for precision.   Therapy Documentation Precautions:  Precautions Precautions: Fall Precaution Comments: HIGH BLOOD PRESSURE!!! Restrictions Weight Bearing Restrictions: No    Vital Signs: Before beginning therapy: 160s/80s  139/94 after shower After dressing sit to stand 127/100 Pain:  no c/o pain   See FIM for current functional status  Therapy/Group: Individual Therapy  Roney Mans [facility] 07/08/2013, 8:48 AM

## 2013-07-09 ENCOUNTER — Inpatient Hospital Stay (HOSPITAL_COMMUNITY): Payer: BC Managed Care – PPO

## 2013-07-09 ENCOUNTER — Inpatient Hospital Stay (HOSPITAL_COMMUNITY): Payer: BC Managed Care – PPO | Admitting: Occupational Therapy

## 2013-07-09 ENCOUNTER — Inpatient Hospital Stay (HOSPITAL_COMMUNITY): Payer: BC Managed Care – PPO | Admitting: Physical Therapy

## 2013-07-09 ENCOUNTER — Encounter (HOSPITAL_COMMUNITY): Payer: BC Managed Care – PPO

## 2013-07-09 DIAGNOSIS — I10 Essential (primary) hypertension: Secondary | ICD-10-CM

## 2013-07-09 DIAGNOSIS — I619 Nontraumatic intracerebral hemorrhage, unspecified: Secondary | ICD-10-CM

## 2013-07-09 MED ORDER — AMLODIPINE BESYLATE 10 MG PO TABS
10.0000 mg | ORAL_TABLET | Freq: Every day | ORAL | Status: DC
  Administered 2013-07-10 – 2013-07-18 (×9): 10 mg via ORAL
  Filled 2013-07-09 (×10): qty 1

## 2013-07-09 MED ORDER — HYDROCHLOROTHIAZIDE 25 MG PO TABS
25.0000 mg | ORAL_TABLET | Freq: Every day | ORAL | Status: DC
  Administered 2013-07-09 – 2013-07-13 (×5): 25 mg via ORAL
  Filled 2013-07-09 (×6): qty 1

## 2013-07-09 NOTE — Progress Notes (Signed)
Physical Therapy Note  Patient Details  Name: Paul Reeves MRN: [id number] Date of Birth: [birth year] Today's Date: 07/09/2013  Time: 1300-1345 45 minutes  1:1 no c/o pain.  Pt fatigued upon PT arrival, lethargic throughout session, BP 128/78 at rest, 133/81 after activity.  Pt able to propel w/c with supervision.  Gait training with [facility] multiple attempts with L DF wrapped.  Pt min A with tactile cues for trunk control, manual facilitation for wt shifts.  Standing NMR with focus on L LE stance control with stepping up, sit to stands, side stepping.  Pt requires manual facilitation for wt shifts to L, improves with repetition and tactile cuing at glutes.  Nu step for UE/LE strength on L, pt requires assist for UE to stay on machine, able to perform x 3 minutes with multiple rests.  Pt required multiple prolonged rest breaks throughout session due to lethargy and was unable to complete full session, missed 15 minutes skilled PT.   Shanard Treto 07/09/2013, 1:50 PM

## 2013-07-09 NOTE — Progress Notes (Signed)
Occupational Therapy Session Note  Patient Details  Name: Paul Reeves MRN: [id number] Date of Birth: June 04, 1986  Today's Date: 07/09/2013 Time: 1130-1200 Time Calculation (min): 30 min  Short Term Goals: Week 1:  OT Short Term Goal 1 (Week 1): Pt will complete tub/shower transfer with min A OT Short Term Goal 2 (Week 1): Pt will complete LB dressing tasks with min A. OT Short Term Goal 3 (Week 1): Pt will utilize LUE to bathe 50% of body  OT Short Term Goal 4 (Week 1): Pt will require no more than steadying assist for dynamic standing during clothing management.   Skilled Therapeutic Interventions/Progress Updates:    1:1 Neuromuscular reeducation with focus on left LE and UE in functional activity including: self propelling the w/c with bilateral LEs down the hallway, reach and grasp and release in all planes with left UE (following PNF patterns), functional ambulation with quad cane with steadying A, weight bearing through left UE for sit<>stands and tying shoes with bilateral fingers addressing dexterity.   Therapy Documentation Precautions:  Precautions Precautions: Fall Precaution Comments: HIGH BLOOD PRESSURE!!! Restrictions Weight Bearing Restrictions: No Pain: Pain Assessment Pain Assessment: No/denies pain  See FIM for current functional status  Therapy/Group: Individual Therapy  Roney Mans [facility] 07/09/2013, 12:05 PM

## 2013-07-09 NOTE — Progress Notes (Signed)
Subjective/Complaints:  slept fairly well. Denies pain.  A 12 point review of systems has been performed and if not noted above is otherwise negative.   Objective: Vital Signs: Blood pressure 151/103, pulse 71, temperature 98.1 F (36.7 C), temperature source Oral, resp. rate 16, weight 96.163 kg (212 lb), SpO2 100.00%. No results found. No results found for this basename: WBC, HGB, HCT, PLT,  in the last 72 hours No results found for this basename: NA, K, CL, CO, GLUCOSE, BUN, CREATININE, CALCIUM,  in the last 72 hours CBG (last 3)  No results found for this basename: GLUCAP,  in the last 72 hours  Wt Readings from Last 3 Encounters:  07/03/13 96.163 kg (212 lb)  06/26/13 101.9 kg (224 lb 10.4 oz)    Physical Exam:  Nursing note and vitals reviewed.  Constitutional: He is oriented to person, place, and time. He appears well-developed and well-nourished.  HENT:  Head: Normocephalic and atraumatic.  Eyes: Conjunctivae are normal. Pupils are equal, round, and reactive to light.  Neck: Normal range of motion. Neck supple.  Cardiovascular: Normal rate and regular rhythm. No murmur  Pulmonary/Chest: Effort normal and breath sounds normal. No respiratory distress. He has no wheezes.  Abdominal: Soft. Bowel sounds are normal. He exhibits no distension. There is tenderness.  Musculoskeletal: He exhibits no edema and no tenderness.  Neurological: He is alert and oriented to person, place, and time.  Speech clear. Left facial weakness persists but better. Left hemiparesis with sensory deficits. LUE is 2+/3- deltoid, 3-/5 bic, tric, HI. LLE is 3-/5 HF, KE; ADF tr, APF 1+. Sensation 1/2 left arm and leg. Mild left facial weakness, visual fields intact.  Skin: Skin is warm and dry.  Psychiatric: He has a normal mood and affect. His behavior is normal. Judgment and thought content normal.    Assessment/Plan: 1. Functional deficits secondary to right basal ganglia/sub insular IPH which require  3+ hours per day of interdisciplinary therapy in a comprehensive inpatient rehab setting. Physiatrist is providing close team supervision and 24 hour management of active medical problems listed below. Physiatrist and rehab team continue to assess barriers to discharge/monitor patient progress toward functional and medical goals. FIM: FIM - Bathing Bathing Steps Patient Completed: Chest;Abdomen;Front perineal area;Left upper leg;Buttocks;Left Arm;Right lower leg (including foot);Right upper leg;Left lower leg (including foot);Right Arm Bathing: 4: Min-Patient completes 8-9 41f 10 parts or 75+ percent  FIM - Upper Body Dressing/Undressing Upper body dressing/undressing steps patient completed: Thread/unthread right sleeve of pullover shirt/dresss;Thread/unthread left sleeve of pullover shirt/dress;Put head through opening of pull over shirt/dress;Pull shirt over trunk Upper body dressing/undressing: 5: Supervision: Safety issues/verbal cues FIM - Lower Body Dressing/Undressing Lower body dressing/undressing steps patient completed: Thread/unthread right underwear leg;Thread/unthread left underwear leg;Thread/unthread right pants leg;Thread/unthread left pants leg;Don/Doff right shoe;Fasten/unfasten right shoe;Don/Doff left shoe;Fasten/unfasten left shoe;Pull pants up/down;Pull underwear up/down;Don/Doff left sock;Don/Doff right sock Lower body dressing/undressing: 4: Steadying Assist  FIM - Toileting Toileting steps completed by patient: Performs perineal hygiene;Adjust clothing prior to toileting;Adjust clothing after toileting Toileting Assistive Devices: Grab bar or rail for support Toileting: 4: Steadying assist  FIM - Diplomatic Services operational officer Devices: Grab bars Toilet Transfers: 4-To toilet/BSC: Min A (steadying Pt. > 75%);4-From toilet/BSC: Min A (steadying Pt. > 75%)  FIM - Bed/Chair Transfer Bed/Chair Transfer Assistive Devices: Arm rests;HOB elevated;Bed  rails Bed/Chair Transfer: 4: Chair or W/C > Bed: Min A (steadying Pt. > 75%);4: Bed > Chair or W/C: Min A (steadying Pt. > 75%)  FIM -  Locomotion: Wheelchair Locomotion: Wheelchair: 5: Travels 150 ft or more: maneuvers on rugs and over door sills with supervision, cueing or coaxing FIM - Locomotion: Ambulation Locomotion: Ambulation Assistive Devices: Walker - Rolling;Orthosis Ambulation/Gait Assistance: 4: Min assist Locomotion: Ambulation: 2: Travels 50 - 149 ft with minimal assistance (Pt.>75%)  Comprehension Comprehension Mode: Auditory Comprehension: 7-Follows complex conversation/direction: With no assist  Expression Expression Mode: Verbal Expression: 7-Expresses complex ideas: With no assist  Social Interaction Social Interaction: 7-Interacts appropriately with others - No medications needed.  Problem Solving Problem Solving: 7-Solves complex problems: Recognizes & self-corrects  Memory Memory: 7-Complete Independence: No helper  Medical Problem List and Plan:  1. DVT Prophylaxis/Anticoagulation: Mechanical: Antiembolism stockings, knee (TED hose) Bilateral lower extremities  Sequential compression devices, below knee Bilateral lower extremities  2. Pain Management:  prn vicodin for headaches. Overall improving.  3. Mood: Will have LCSW follow for evaluation. Poor insight with impaired awareness at this time. Will monitor.  4. Neuropsych: This patient is capable of making decisions on his own behalf.  5. Malignant HTN: adjust scheduling of labetalol to account for am htn  -add diuretic today  LOS (Days) 8 A FACE TO FACE EVALUATION WAS PERFORMED  Patton Rabinovich T 07/09/2013 8:29 AM

## 2013-07-09 NOTE — Progress Notes (Signed)
Occupational Therapy Session Note (Cancellation)  Patient Details  Name: Paul Reeves MRN: [id number] Date of Birth: 05-Mar-1986  Today's Date: 07/09/2013  Skilled Therapeutic Interventions/Progress Updates:    Pt felt unable to participate in therapy this afternoon due to dizziness and general fatigue.  Pt asleep upon entry to room, BP in supine position was 136/98.  Also complained of headache, pt notified RN, RN in to administer pain medication at this time.   45 minutes missed therapy.   Therapy Documentation Precautions:  Precautions Precautions: Fall Precaution Comments: HIGH BLOOD PRESSURE!!! Restrictions Weight Bearing Restrictions: No General: Missed Time. See above.   Kandis Ban 07/09/2013, 3:53 PM

## 2013-07-09 NOTE — Progress Notes (Signed)
Physical Therapy Weekly Progress Note  Patient Details  Name: Paul Reeves MRN: [id number] Date of Birth: June 03, 1986  Today's Date: 07/09/2013  Patient has met 2 of 2 short term goals.  Pt's BP better under control and better able to participate in therapy.  Pt is min A with transfers and min A with gait with LBQC short distances.  Pt continues to require frequent rests due to fatigue and occasionally requires min-mod A due to LOB with dynamic standing tasks.  Patient continues to demonstrate the following deficits: decreased balance, impaired gait, L sided weakness, decreased activity tolerance and therefore will continue to benefit from skilled PT intervention to enhance overall performance with activity tolerance, balance, postural control, ability to compensate for deficits and functional use of  left upper extremity and left lower extremity.  Patient progressing toward long term goals..  Continue plan of care.  PT Short Term Goals Week 1:  PT Short Term Goal 1 (Week 1): Pt will perform functional transfers with min A PT Short Term Goal 1 - Progress (Week 1): Met PT Short Term Goal 2 (Week 1): Pt will gait with mod A in controlled environment 25' PT Short Term Goal 2 - Progress (Week 1): Met  Skilled Therapeutic Interventions/Progress Updates:  Ambulation/gait training;Discharge planning;Functional mobility training;Therapeutic Activities;Therapeutic Exercise;Wheelchair propulsion/positioning;Balance/vestibular training;Neuromuscular re-education;Pain management;Splinting/orthotics;UE/LE Strength taining/ROM;DME/adaptive equipment instruction;Community reintegration;Patient/family education;Stair training;Functional electrical stimulation;UE/LE Coordination activities    See FIM for current functional status    Hershy Flenner 07/09/2013, 7:19 AM

## 2013-07-09 NOTE — Progress Notes (Signed)
Occupational Therapy Session Note  Patient Details  Name: Paul Reeves MRN: [id number] Date of Birth: 09-Apr-1986  Today's Date: 07/09/2013 Time: 1000-1100 Time Calculation (min): 60 min  Short Term Goals: Week 1:  OT Short Term Goal 1 (Week 1): Pt will complete tub/shower transfer with min A OT Short Term Goal 2 (Week 1): Pt will complete LB dressing tasks with min A. OT Short Term Goal 3 (Week 1): Pt will utilize LUE to bathe 50% of body  OT Short Term Goal 4 (Week 1): Pt will require no more than steadying assist for dynamic standing during clothing management.   Skilled Therapeutic Interventions/Progress Updates:    Pt resting in bed upon arrival and sat EOB with supervision.  Pt amb with LBQC to bathroom to use toilet prior to transfering to shower for bathing.  Pt required min A for ambulation and transfers.  Pt completed dressing tasks with sit<>stand from EOB.  Focus during BADLs on increased use of LUE/hand to assist and to use as dominant UE.  Pt continues to exhibit limited grasp and difficulty with finger extension during bathing tasks.  Pt initiated use of LUE throughout session without verbal cues from therapist. Pt required min verbal cues for LLE placement prior to sit<>stand.  Pt exhibited difficulty advancing LLE over uneven surfaces when entering bathroom and transferring to shower and required several attempts to lift LLE onto elevated surface.     Therapy Documentation Precautions:  Precautions Precautions: Fall Precaution Comments: HIGH BLOOD PRESSURE!!! Restrictions Weight Bearing Restrictions: No    Vital Signs:  BP at beginning of session - 161/109 BP during session - 135/94 BP at end of session - 136/98 Pain: Pain Assessment Pain Assessment: No/denies pain  See FIM for current functional status  Therapy/Group: Individual Therapy  Rich Brave 07/09/2013, 11:04 AM

## 2013-07-09 NOTE — Progress Notes (Signed)
This note has been reviewed and this clinician agrees with information provided.  

## 2013-07-10 ENCOUNTER — Encounter (HOSPITAL_COMMUNITY): Payer: BC Managed Care – PPO | Admitting: Occupational Therapy

## 2013-07-10 ENCOUNTER — Inpatient Hospital Stay (HOSPITAL_COMMUNITY): Payer: BC Managed Care – PPO | Admitting: Occupational Therapy

## 2013-07-10 ENCOUNTER — Inpatient Hospital Stay (HOSPITAL_COMMUNITY): Payer: BC Managed Care – PPO | Admitting: Physical Therapy

## 2013-07-10 ENCOUNTER — Encounter (HOSPITAL_COMMUNITY): Payer: BC Managed Care – PPO

## 2013-07-10 DIAGNOSIS — I619 Nontraumatic intracerebral hemorrhage, unspecified: Secondary | ICD-10-CM

## 2013-07-10 DIAGNOSIS — I10 Essential (primary) hypertension: Secondary | ICD-10-CM

## 2013-07-10 NOTE — Progress Notes (Signed)
Subjective/Complaints:  no problems overnight. Appears to have slept better A 12 point review of systems has been performed and if not noted above is otherwise negative.   Objective: Vital Signs: Blood pressure 150/96, pulse 98, temperature 98.3 F (36.8 C), temperature source Oral, resp. rate 18, weight 96.163 kg (212 lb), SpO2 98.00%. No results found. No results found for this basename: WBC, HGB, HCT, PLT,  in the last 72 hours No results found for this basename: NA, K, CL, CO, GLUCOSE, BUN, CREATININE, CALCIUM,  in the last 72 hours CBG (last 3)  No results found for this basename: GLUCAP,  in the last 72 hours  Wt Readings from Last 3 Encounters:  07/03/13 96.163 kg (212 lb)  06/26/13 101.9 kg (224 lb 10.4 oz)    Physical Exam:  Nursing note and vitals reviewed.  Constitutional: He is oriented to person, place, and time. He appears well-developed and well-nourished.  HENT:  Head: Normocephalic and atraumatic.  Eyes: Conjunctivae are normal. Pupils are equal, round, and reactive to light.  Neck: Normal range of motion. Neck supple.  Cardiovascular: Normal rate and regular rhythm. No murmur  Pulmonary/Chest: Effort normal and breath sounds normal. No respiratory distress. He has no wheezes.  Abdominal: Soft. Bowel sounds are normal. He exhibits no distension. There is tenderness.  Musculoskeletal: He exhibits no edema and no tenderness.  Neurological: He is alert and oriented to person, place, and time.  Speech clear. Left facial weakness persists but better. Left hemiparesis with sensory deficits. LUE is 3- deltoid, 3/5 bic, tric, HI. LLE is 3/5 HF, KE; ADF tr, APF 1+. Sensation 1/2 left arm and leg. Mild left facial weakness, visual fields intact.  Skin: Skin is warm and dry.  Psychiatric: He has a normal mood and affect. His behavior is normal. Judgment and thought content normal.    Assessment/Plan: 1. Functional deficits secondary to right basal ganglia/sub insular IPH  which require 3+ hours per day of interdisciplinary therapy in a comprehensive inpatient rehab setting. Physiatrist is providing close team supervision and 24 hour management of active medical problems listed below. Physiatrist and rehab team continue to assess barriers to discharge/monitor patient progress toward functional and medical goals.  AFO LLE   FIM: FIM - Bathing Bathing Steps Patient Completed: Chest;Abdomen;Front perineal area;Left upper leg;Buttocks;Left Arm;Right lower leg (including foot);Right upper leg;Left lower leg (including foot);Right Arm Bathing: 4: Steadying assist  FIM - Upper Body Dressing/Undressing Upper body dressing/undressing steps patient completed: Thread/unthread right sleeve of pullover shirt/dresss;Thread/unthread left sleeve of pullover shirt/dress;Put head through opening of pull over shirt/dress;Pull shirt over trunk Upper body dressing/undressing: 5: Supervision: Safety issues/verbal cues FIM - Lower Body Dressing/Undressing Lower body dressing/undressing steps patient completed: Thread/unthread right underwear leg;Thread/unthread left underwear leg;Thread/unthread right pants leg;Thread/unthread left pants leg;Don/Doff right shoe;Fasten/unfasten right shoe;Don/Doff left shoe;Fasten/unfasten left shoe;Pull pants up/down;Pull underwear up/down;Don/Doff left sock;Don/Doff right sock Lower body dressing/undressing: 4: Steadying Assist  FIM - Toileting Toileting steps completed by patient: Performs perineal hygiene;Adjust clothing prior to toileting;Adjust clothing after toileting Toileting Assistive Devices: Grab bar or rail for support Toileting: 4: Steadying assist  FIM - Diplomatic Services operational officer Devices: Grab bars;Occupational hygienist Transfers: 4-To toilet/BSC: Min A (steadying Pt. > 75%);4-From toilet/BSC: Min A (steadying Pt. > 75%)  FIM - Bed/Chair Transfer Bed/Chair Transfer Assistive Devices: Arm rests;HOB elevated;Bed  rails Bed/Chair Transfer: 5: Chair or W/C > Bed: Supervision (verbal cues/safety issues);5: Bed > Chair or W/C: Supervision (verbal cues/safety issues)  FIM - Locomotion: Wheelchair Locomotion: Wheelchair:  5: Travels 150 ft or more: maneuvers on rugs and over door sills with supervision, cueing or coaxing FIM - Locomotion: Ambulation Locomotion: Ambulation Assistive Devices: Walker - Rolling;Orthosis Ambulation/Gait Assistance: 4: Min assist Locomotion: Ambulation: 2: Travels 50 - 149 ft with minimal assistance (Pt.>75%)  Comprehension Comprehension Mode: Auditory Comprehension: 7-Follows complex conversation/direction: With no assist  Expression Expression Mode: Verbal Expression: 7-Expresses complex ideas: With no assist  Social Interaction Social Interaction: 7-Interacts appropriately with others - No medications needed.  Problem Solving Problem Solving: 7-Solves complex problems: Recognizes & self-corrects  Memory Memory: 7-Complete Independence: No helper  Medical Problem List and Plan:  1. DVT Prophylaxis/Anticoagulation: Mechanical: Antiembolism stockings, knee (TED hose) Bilateral lower extremities  Sequential compression devices, below knee Bilateral lower extremities  2. Pain Management:  prn vicodin for headaches. Overall improving.  3. Mood: Will have LCSW follow for evaluation. Poor insight with impaired awareness at this time. Will monitor.  4. Neuropsych: This patient is capable of making decisions on his own behalf.  5. Malignant HTN: adjusted scheduling of labetalol   -added diuretic and changed scheduling of norvasc also to help am pressures and avoid bottoming out and fatigue during the day. Will watch and see how he feels with therapy this afternoon.  LOS (Days) 9 A FACE TO FACE EVALUATION WAS PERFORMED  Pauline Trainer T 07/10/2013 9:46 AM

## 2013-07-10 NOTE — Progress Notes (Signed)
Occupational Therapy Weekly Progress Note  Patient Details  Name: Paul Reeves MRN: [id number] Date of Birth: 1985/11/05  Today's Date: 07/10/2013 Time: 0900-1000 Patient has met 4 of 4 short term goals.  Pt is currently overall min A to supervision assist with BADL's.  Pt has improved with dynamic standing balance and functional transfers during ADL tasks and is beginning to incorporate some ambulation into ADL routine.  Demonstrates good safety awareness and frequent use of LUE for tasks without prompting.  Family education not yet completed.   Patient continues to demonstrate the following deficits:muscle weakness, decreased cardiorespiratoy endurance and decreased coordination, hemiplegia, impaired balance and therefore will continue to benefit from skilled OT intervention to enhance overall performance with BADL.  Patient progressing toward long term goals..  Continue plan of care.  OT Short Term Goals Week 1:  OT Short Term Goal 1 (Week 1): Pt will complete tub/shower transfer with min A OT Short Term Goal 1 - Progress (Week 1): Met OT Short Term Goal 2 (Week 1): Pt will complete LB dressing tasks with min A. OT Short Term Goal 2 - Progress (Week 1): Met OT Short Term Goal 3 (Week 1): Pt will utilize LUE to bathe 50% of body  OT Short Term Goal 3 - Progress (Week 1): Met OT Short Term Goal 4 (Week 1): Pt will require no more than steadying assist for dynamic standing during clothing management.  OT Short Term Goal 4 - Progress (Week 1): Met  Week 2 STG's=LTG's due to ELOS  Skilled Therapeutic Interventions/Progress Updates:    First Session: Individual Time: 0900-1000 Time Calculation: 60 minutes  Pt engaged in 1:1 self-care session focused on dynamic standing balance and NMR of LUE.  Completed shower transfer from w/c, requiring short distance of ambulation and min A.  Bathed 50%+ of body with LUE, no HOH assist required.  Required steadying assist-supervision for dynamic  standing balance throughout session.  Utilized LUE to lock/unlock w/c breaks and no UE assist for sit to stand/stand to sit transfers.  LUE integrated in grooming tasks seated at sink.  For remainder of session, pt engaged in reaching and placing task using cards placed in various planes and distance from pt., required to place on bedside table in specific pattern, requiring some element of precision.  Pt became very fatigued with task after approximately 3 cards, rest break taken and activity resumed for remaining 7 cards.  Pt transferred back to bed for rest before afternoon therapy, complained of slight headache at conclusion of session.  BP 118/76 at that time.  Pt requested pain medication from RN.    Second Session: Individual Time: 4696-2952 Time Calculation: 30 minutes  Pt engaged in session on NMR of LUE, dynamic standing balance.  Pt engaged in vertical checkers game, standing for approximately 50% of task with one seated rest break, seated for remainder due to fatigue.  Min verbal cues for weight shift to LUE during standing,   Utilized LUE to reach and manipulate game pieces while following game strategy, verbal and manual cues to decrease shoulder hiking and follow normalized patterns of movement.  Ambulated approximately half way back to room with quad cane. No complaints of pain.    Therapy Documentation Precautions:  Precautions Precautions: Fall Precaution Comments: HIGH BLOOD PRESSURE!!! Restrictions Weight Bearing Restrictions: No    Pain:  None.   See FIM for current functional status  Therapy/Group: Individual Therapy  Kandis Ban 07/10/2013, 12:08 PM

## 2013-07-10 NOTE — Progress Notes (Signed)
Social Work Patient ID: Paul Reeves, male   DOB: 1986/07/20, 28 y.o.   MRN: [id number]  Have reviewed team conference with pt (and two aunts).  Pt aware and agreeable with recommendation to extend stay to 10/24 as he is making good progress, very motivated and feel this is needed to reach goals.  He reports he did have a better afternoon today (less fatigue).  Will alert insurance to request to extend LOS and continue to follow for support and d/c planning.  Bo Teicher, LCSW

## 2013-07-10 NOTE — Progress Notes (Signed)
Physical Therapy Note  Patient Details  Name: Dub Maclellan MRN: [id number] Date of Birth: February 25, 1986 Today's Date: 07/10/2013  Time 1: 730-825 55 minutes  1:1 No c/o pain.  W/c mobility throughout unit and room with supervision.  Gait training with L toe off AFO with improved toe clearance, min A for trunk stability and balance reactions.  Gait with obstacle negotiation with min A, cues for sequencing.  Stair training to 1 step to simulate home entry.  Pt min A with LBQC.  Standing NMR with focus on L LE eccentric strength and control with mod A, multiple rests due to mm fatigue.  Kinetron for LE strength and endurance 2 x 3 minutes.  Pt better able to tolerate therapy this AM, still with decreased activity tolerance overall.  BP after activity 144/89.  Time 2: 1300-1358 58 minutes  1:1 No c/o pain.  Pt asleep upon PT arrival but easily aroused and able to participate in therapy without significant fatigue.  Gait in controlled environment with mod distractions with min A, pt able to perform 2 point gait pattern with [facility].  Quadruped and tall kneeling for core strength and stability. Pt able to perform wt shifts and reaching tasks in tall kneeling with min A, alternating UE lifts in quadruped with max A.  Supine bridging, LTR and hip flex all with adduction as well as single leg bridging for core, glute and L LE strengthening with tactile cues at hamstrings and glutes.  Sitting on large theraball, wt shifts all directions with mod A for wt shifts out of base of support to L, facilitation at trunk for reaching tasks. Pt with much improved activity tolerance this afternoon after taking a nap this morning.   Grantham Hippert 07/10/2013, 8:25 AM

## 2013-07-10 NOTE — Progress Notes (Signed)
This note has been reviewed and this clinician agrees with information provided.  

## 2013-07-10 NOTE — Patient Care Conference (Signed)
Inpatient RehabilitationTeam Conference and Plan of Care Update Date: 07/09/2013   Time: 3:05 PM    Patient Name: Paul Reeves      Medical Record Number: [id number]  Date of Birth: 1986-02-12 Sex: Male         Room/Bed: 4W21C/4W21C-01 Payor Info: Payor: BLUE [employer] BLUE [employer]IELD / Plan: BCBS PPO OUT OF STATE / Product Type: *No Product type* /    Admitting Diagnosis: R BG SUBINSULAR ICH  Admit Date/Time:  07/01/2013  5:56 PM Admission Comments: No comment available   Primary Diagnosis:  Intracranial hemorrhage Principal Problem: Intracranial hemorrhage  Patient Active Problem List   Diagnosis Date Noted  . 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 Hypokalemia 07/01/2013  . Intracranial hemorrhage 07/01/2013    Expected Discharge Date: Expected Discharge Date: 07/19/13  Team Members Present: Physician leading conference: Dr. Faith Rogue Social Worker Present: Amada Jupiter, LCSW Nurse Present: Other (comment) Tennis Must, RN) PT Present: Zerita Boers, PT OT Present: Mackie Pai, OT;Jennifer Ivor Messier, OT SLP Present: Feliberto Gottron, SLP PPS Coordinator present : Edson Snowball, PT     Current Status/Progress Goal Weekly Team Focus  Medical   bp issues, day time fatigue. improving neurologically  see prior   see prior   Bowel/Bladder   cont bowel and bladder; LBM 10/13  remain cont of bowel and bladder  monitor for contipation/diarrhea   Swallow/Nutrition/ Hydration             ADL's   min A-supervision  mod I  NMR of LUE, functional transfers, dynamic standing   Mobility   min A  supervision, mod I w/c  balance, NMR   Communication             Safety/Cognition/ Behavioral Observations            Pain   n/a         Skin   CDI          Rehab Goals Patient on target to meet rehab goals: Yes *See Care Plan and progress notes for long and short-term goals.  Barriers to Discharge: bp, day time fatiguge    Possible Resolutions to Barriers:   med changes, AFO for left ankle/foot control    Discharge Planning/Teaching Needs:  home with family to try and provide 24/7 assistance shared mostly between pt's mother and sister      Team Discussion:  BP much better control but decreased activity tolerance especially in afternoons.  MD to make further adjustments.  Anticipate reaching mod i w/c goals but may need supervision when up.    Revisions to Treatment Plan:  Recommend extension of stay to meet goals.      Bryann Mcnealy 07/10/2013, 3:57 PM

## 2013-07-11 ENCOUNTER — Encounter (HOSPITAL_COMMUNITY): Payer: BC Managed Care – PPO | Admitting: Occupational Therapy

## 2013-07-11 ENCOUNTER — Inpatient Hospital Stay (HOSPITAL_COMMUNITY): Payer: BC Managed Care – PPO | Admitting: Occupational Therapy

## 2013-07-11 ENCOUNTER — Inpatient Hospital Stay (HOSPITAL_COMMUNITY): Payer: BC Managed Care – PPO | Admitting: Physical Therapy

## 2013-07-11 ENCOUNTER — Inpatient Hospital Stay (HOSPITAL_COMMUNITY): Payer: BC Managed Care – PPO

## 2013-07-11 LAB — VMA AND HVA, 24 HR UR
24 Hour urine volume (VMAHVA): 900 mL/24 h
Creatinine, Urine mg/day-VMAUR: 2.33 g/(24.h) (ref 0.63–2.50)
Homovanillic Acid, 24H Ur: 5.6 mg/24 h (ref 1.6–7.5)
VMA, 24H Ur Adult: 3.5 mg/24 h (ref <=6.0)

## 2013-07-11 NOTE — Progress Notes (Signed)
Physical Therapy Note  Patient Details  Name: [name] MRN: [id number] Date of Birth: 08/23/1986 Today's Date: 07/11/2013  Time: 730-826 56 minutes  1:1 no c/o pain.  Gait throughout unit with [facility] and L toe off AFO with min A in controlled environment, min A for slight LOB when distracted in busy hallway.  Household gait training on carpet with obstacle negotiation, side and backward stepping with min A.  Pt continues to require assist due to delayed balance reactions.  Standing NMR with L UE for grasp and release task with reaching and squatting with manual facilitation for wt shifts, good use of L UE.  Pt with good activity tolerance during session, BP 133/88.   DONAWERTH,KAREN 07/11/2013, 8:27 AM

## 2013-07-11 NOTE — Progress Notes (Signed)
Physical Therapy Session Note  Patient Details  Name: Paul Reeves MRN: [id number] Date of Birth: 22-Dec-1985  Today's Date: 07/11/2013 Time: 4098-1191 Time Calculation (min): 34 min  Short Term Goals: Week 1:  PT Short Term Goal 1 (Week 1): Pt will perform functional transfers with min A PT Short Term Goal 1 - Progress (Week 1): Met PT Short Term Goal 2 (Week 1): Pt will gait with mod A in controlled environment 25' PT Short Term Goal 2 - Progress (Week 1): Met  Skilled Therapeutic Interventions/Progress Updates:    Pt resting in bed; reports BP better from this am.  Pt agreeable to therapy.  Performed supine > sit EOB with supervision; assessed vitals EOB; see below.  Pt performed stand pivot bed > w/c with supervision with minimal UE support on arm rest.  Performed dynamic gait training outside on uneven pavement >300 with [facility] with L and R turns, over grass and up/down one step curb all with min A overall and intermittent verbal cues for wider BOS to minimize LLE scissoring and tactile cues to advance COG fully over L stance LE to minimize genu recurvatum.  Pt noted to have increased foot drag and LOB when not attending visually to feet and during dual task of conversation.  Pt tolerated well and was able to self correct minor LOB and able to recall safe stepping sequence for one step/curb.    Therapy Documentation Precautions:  Precautions Precautions: Fall Precaution Comments: HIGH BLOOD PRESSURE!!! Restrictions Weight Bearing Restrictions: No General: Amount of Missed PT Time (min): 33 Minutes Missed Time Reason: Patient fatigue Vital Signs: Therapy Vitals Temp: 97.4 F (36.3 C) Temp src: Oral Pulse Rate: 66 Resp: 18 BP: 153/98 mmHg Patient Position, if appropriate: Sitting Oxygen Therapy SpO2: 97 % O2 Device: None (Room air) Pain: Pain Assessment Pain Assessment: No/denies pain Pain Score: 0-No pain Locomotion : Ambulation Ambulation/Gait Assistance: 4: Min  assist   See FIM for current functional status  Therapy/Group: Individual Therapy  Edman Circle [facility] 07/11/2013, 2:24 PM

## 2013-07-11 NOTE — Progress Notes (Signed)
Occupational Therapy Session Note  Patient Details  Name: Dashiell Franchino MRN: [id number] Date of Birth: [birth year]  Today's Date: 07/11/2013 Time: 1430-1500 Time Calculation (min): 30 min  Skilled Therapeutic Interventions/Progress Updates:    1:1 neuro reeducation with focus on [facility] of opening a packet of Smarties and practicing stacking smarties and picking up them one by one and self feeding each one with min cuing to decr hiking shoulder.   Therapy Documentation Precautions:  Precautions Precautions: Fall Precaution Comments: HIGH BLOOD PRESSURE!!! Restrictions Weight Bearing Restrictions: No Pain: Pain Assessment Pain Assessment: No/denies pain  See FIM for current functional status  Therapy/Group: Individual Therapy  Roney Mans [facility] 07/11/2013, 3:51 PM

## 2013-07-11 NOTE — Progress Notes (Signed)
Subjective/Complaints:  denies pain. Slept well. Afternoon fatigue a little better? A 12 point review of systems has been performed and if not noted above is otherwise negative.   Objective: Vital Signs: Blood pressure 156/107, pulse 98, temperature 98.5 F (36.9 C), temperature source Oral, resp. rate 18, weight 96.163 kg (212 lb), SpO2 98.00%. No results found. No results found for this basename: WBC, HGB, HCT, PLT,  in the last 72 hours No results found for this basename: NA, K, CL, CO, GLUCOSE, BUN, CREATININE, CALCIUM,  in the last 72 hours CBG (last 3)  No results found for this basename: GLUCAP,  in the last 72 hours  Wt Readings from Last 3 Encounters:  07/03/13 96.163 kg (212 lb)  06/26/13 101.9 kg (224 lb 10.4 oz)    Physical Exam:  Nursing note and vitals reviewed.  Constitutional: He is oriented to person, place, and time. He appears well-developed and well-nourished.  HENT:  Head: Normocephalic and atraumatic.  Eyes: Conjunctivae are normal. Pupils are equal, round, and reactive to light.  Neck: Normal range of motion. Neck supple.  Cardiovascular: Normal rate and regular rhythm. No murmur  Pulmonary/Chest: Effort normal and breath sounds normal. No respiratory distress. He has no wheezes.  Abdominal: Soft. Bowel sounds are normal. He exhibits no distension. There is tenderness.  Musculoskeletal: He exhibits no edema and no tenderness.  Neurological: He is alert and oriented to person, place, and time.  Speech clear. Left facial weakness persists but better. Left hemiparesis with sensory deficits. LUE is 3- deltoid, 3/5 bic, tric, HI. LLE is 3/5 HF, KE; ADF tr, APF 1+. Sensation 1/2 left arm and leg. Mild left facial weakness, visual fields intact.  Skin: Skin is warm and dry.  Psychiatric: He has a normal mood and affect. His behavior is normal. Judgment and thought content normal.    Assessment/Plan: 1. Functional deficits secondary to right basal ganglia/sub  insular IPH which require 3+ hours per day of interdisciplinary therapy in a comprehensive inpatient rehab setting. Physiatrist is providing close team supervision and 24 hour management of active medical problems listed below. Physiatrist and rehab team continue to assess barriers to discharge/monitor patient progress toward functional and medical goals.  Extended stay to capture neurological and functional progress.    FIM: FIM - Bathing Bathing Steps Patient Completed: Chest;Abdomen;Front perineal area;Left upper leg;Buttocks;Left Arm;Right lower leg (including foot);Right upper leg;Left lower leg (including foot);Right Arm Bathing: 4: Steadying assist  FIM - Upper Body Dressing/Undressing Upper body dressing/undressing steps patient completed: Thread/unthread right sleeve of pullover shirt/dresss;Thread/unthread left sleeve of pullover shirt/dress;Put head through opening of pull over shirt/dress;Pull shirt over trunk Upper body dressing/undressing: 5: Supervision: Safety issues/verbal cues FIM - Lower Body Dressing/Undressing Lower body dressing/undressing steps patient completed: Thread/unthread right underwear leg;Thread/unthread left underwear leg;Thread/unthread right pants leg;Thread/unthread left pants leg;Don/Doff right shoe;Fasten/unfasten right shoe;Don/Doff left shoe;Fasten/unfasten left shoe;Pull pants up/down;Pull underwear up/down;Don/Doff left sock;Don/Doff right sock Lower body dressing/undressing: 5: Supervision: Safety issues/verbal cues  FIM - Toileting Toileting steps completed by patient: Performs perineal hygiene;Adjust clothing prior to toileting;Adjust clothing after toileting Toileting Assistive Devices: Grab bar or rail for support Toileting: 4: Steadying assist  FIM - Diplomatic Services operational officer Devices: Grab bars;Occupational hygienist Transfers: 4-To toilet/BSC: Min A (steadying Pt. > 75%);4-From toilet/BSC: Min A (steadying Pt. > 75%)  FIM -  Bed/Chair Transfer Bed/Chair Transfer Assistive Devices: Arm rests;HOB elevated;Bed rails Bed/Chair Transfer: 5: Supine > Sit: Supervision (verbal cues/safety issues);4: Bed > Chair or W/C: Min A (steadying  Pt. > 75%);4: Chair or W/C > Bed: Min A (steadying Pt. > 75%)  FIM - Locomotion: Wheelchair Locomotion: Wheelchair: 5: Travels 150 ft or more: maneuvers on rugs and over door sills with supervision, cueing or coaxing FIM - Locomotion: Ambulation Locomotion: Ambulation Assistive Devices: Walker - Rolling;Orthosis Ambulation/Gait Assistance: 4: Min assist Locomotion: Ambulation: 2: Travels 50 - 149 ft with minimal assistance (Pt.>75%)  Comprehension Comprehension Mode: Auditory Comprehension: 7-Follows complex conversation/direction: With no assist  Expression Expression Mode: Verbal Expression: 7-Expresses complex ideas: With no assist  Social Interaction Social Interaction: 7-Interacts appropriately with others - No medications needed.  Problem Solving Problem Solving: 7-Solves complex problems: Recognizes & self-corrects  Memory Memory: 7-Complete Independence: No helper  Medical Problem List and Plan:  1. DVT Prophylaxis/Anticoagulation: Mechanical: Antiembolism stockings, knee (TED hose) Bilateral lower extremities  Sequential compression devices, below knee Bilateral lower extremities  2. Pain Management:  prn vicodin for headaches. Overall improving.  3. Mood: Will have LCSW follow for evaluation. Poor insight with impaired awareness at this time. Will monitor.  4. Neuropsych: This patient is capable of making decisions on his own behalf.  5. Malignant HTN: adjusted scheduling of labetalol   -added diuretic and changed scheduling of norvasc also to help am pressures and avoid afternoon hypotension and fatigue during the day. Am bp's look a little better- no further changes at present.  LOS (Days) 10 A FACE TO FACE EVALUATION WAS PERFORMED  Patrecia Veiga T 07/11/2013  8:23 AM

## 2013-07-11 NOTE — Progress Notes (Signed)
Physical Therapy Session Note  Patient Details  Name: Filemon Breton MRN: [id number] Date of Birth: [birth year]  Today's Date: 07/11/2013 Time: 7829-5621 Time Calculation (min): 12 min  Short Term Goals: Week 1:  PT Short Term Goal 1 (Week 1): Pt will perform functional transfers with min A PT Short Term Goal 1 - Progress (Week 1): Met PT Short Term Goal 2 (Week 1): Pt will gait with mod A in controlled environment 25' PT Short Term Goal 2 - Progress (Week 1): Met  Skilled Therapeutic Interventions/Progress Updates:    Patient received sitting in wheelchair, asleep, requires verbal and tactile cues to arouse. Patient remains lethargic. Patient reports he "just received his BP meds and they make him sleepy" and "I tried to stay up to do therapy, but I'm used to resting during this time." Vitals taken sitting in wheelchair, see below. Patient performed x3 sit<>stand transfers from wheelchair with min guard and use of LBQC for support upon standing. On third trial, patient performs stand pivot transfer to bed with [facility] and min guard. Patient sit>supine with supervision. Patient left supine in bed with all needs within reach and bed alarm on.   Therapy Documentation Precautions:  Precautions Precautions: Fall Precaution Comments: HIGH BLOOD PRESSURE!!! Restrictions Weight Bearing Restrictions: No General: Amount of Missed PT Time (min): 33 Minutes Missed Time Reason: Patient fatigue Vital Signs: Therapy Vitals Pulse Rate: 72 BP: 114/79 mmHg Patient Position, if appropriate: Sitting Oxygen Therapy SpO2: 99 % O2 Device: None (Room air) Pain: Pain Assessment Pain Assessment: No/denies pain Pain Score: 0-No pain   See FIM for current functional status  Therapy/Group: Individual Therapy  Chipper Herb. Amori Colomb, PT, DPT  07/11/2013, 11:39 AM

## 2013-07-11 NOTE — Progress Notes (Signed)
Occupational Therapy Session Note  Patient Details  Name: Paul Reeves MRN: [id number] Date of Birth: 02-19-1986  Today's Date: 07/11/2013 Time: 0900-1000 Time Calculation: 60 minutes  Short Term Goals: Week 2:   STG=LTGs due to LOS  Skilled Therapeutic Interventions/Progress Updates:    BP 120/78 at start of session from seated position.  Pt complaint of drowsiness. Engaged in 1:1 self-care session focused on functional ambulation, sit to stand transfers, activity tolerance.  Pt ambulated with cane from bedside to shower, undressed from tub bench.  Assist for doffing AFO.  Completed multiple sit to stand transfers in shower, utilized LUE on grab bar, no assist from RUE.  Supervision for dynamic standing balance throughout. LUE incorporated actively during bathing for wringing washcloth, washing body parts.  Pt demonstrated increased ability to maintain finger extension for managing washcloth when bathing.  Dressed from w/c, assist for donning AFO.  Emphasis on sit to stand transfers, multiple completed throughout session with LUE assist only, supervision.  Pt in chair awaiting next therapy at conclusion of session.  No complaints of pain or dizziness.   Therapy Documentation Precautions:  Precautions Precautions: Fall Precaution Comments: HIGH BLOOD PRESSURE!!! Restrictions Weight Bearing Restrictions: No Pain:   See FIM for current functional status  Therapy/Group: Individual Therapy  Kandis Ban 07/11/2013, 9:10 AM

## 2013-07-12 ENCOUNTER — Inpatient Hospital Stay (HOSPITAL_COMMUNITY): Payer: BC Managed Care – PPO | Admitting: Rehabilitation

## 2013-07-12 ENCOUNTER — Inpatient Hospital Stay (HOSPITAL_COMMUNITY): Payer: BC Managed Care – PPO | Admitting: Physical Therapy

## 2013-07-12 ENCOUNTER — Inpatient Hospital Stay (HOSPITAL_COMMUNITY): Payer: BC Managed Care – PPO | Admitting: Occupational Therapy

## 2013-07-12 ENCOUNTER — Encounter (HOSPITAL_COMMUNITY): Payer: BC Managed Care – PPO | Admitting: Occupational Therapy

## 2013-07-12 NOTE — Plan of Care (Signed)
Problem: RH SKIN INTEGRITY Goal: RH STG SKIN FREE OF INFECTION/BREAKDOWN Patient will remain free skin infection/breakdown while on rehab.  Outcome: Progressing No skin breakdown

## 2013-07-12 NOTE — Progress Notes (Signed)
Occupational Therapy Session Note  Patient Details  Name: Paul Reeves MRN: [id number] Date of Birth: 1985-12-22  Today's Date: 07/12/2013   Short Term Goals: Week 2:  OT Short Term Goal 1 (Week 2): Pt will require supervision for dynamic standing balance during clothing management. OT Short Term Goal 2 (Week 2): Pt will use LUE only for sit to stand transfers 100% of the time.  OT Short Term Goal 3 (Week 2): Pt will require no more than min verbal cues for dynamic standing tasks.   Skilled Therapeutic Interventions/Progress Updates:    First Session: Individual Time: 0900-1000 Time Calculation: 60 minutes  Pt appeared sleepy, diaphoretic at start of session, RN in to take vitals (see below).  Engaged in 1:1 self-care session focused on functional ambulation, dynamic balance. Pt ambulated to shower, undressed from tub bench/doffed AFO with supervision.  Bathed <50% of body with LUE.  Ambulated from shower to w/c placed at bedside, dressed from w/c.  Pt demonstrated multiple LOB with dynamic sitting, dynamic and static sitting this session.  Able to self-correct approximately 75% of time.  Pt stated he noticed a minimal difference in his balance today.  Min verbal cues required for safety/problem-solving.     Second Session: Individual Time: 1345-1430 Time Calculation: 45 minutes  Pt in bed at start of session.  BP 130/76, pt complaint of general fatigue.   Donned socks/shoes and self-propelled w/c to gym.  Upon initiating tall-kneeling position on mat, pt unable to stay in upright position and appeared to be diaphoretic.  Stated he was very tired and needed to sit. BP at that time WNL.  Pt sat in w/c in front of fan for comfort to engage in task of LUE reaching and placing small animal figures in various planes for NMR of LUE, encouraged to position animals with accuracy.  Individually placed animals back in container.   No complaints of pain and pt stated feeling better at conclusion of  session.    Therapy Documentation Precautions:  Precautions Precautions: Fall Precaution Comments: HIGH BLOOD PRESSURE!!! Restrictions Weight Bearing Restrictions: No General:   Pain:  None.   See FIM for current functional status  Therapy/Group: Individual Therapy  Kandis Ban 07/12/2013, 9:28 AM

## 2013-07-12 NOTE — Progress Notes (Signed)
Physical Therapy Session Note  Patient Details  Name: Paul Reeves MRN: [id number] Date of Birth: 10-05-1985  Today's Date: 07/12/2013 Time: 3086-5784 Time Calculation (min): 30 min  Short Term Goals: Week 2:     Skilled Therapeutic Interventions/Progress Updates:   Pt received sitting in w/c in room, asleep, but was easily aroused with voice.  Pt able to self propel to gym in w/c at Mod I level.  Performed tall kneeling and quadruped activity in gym for NMR through LLE/UE and increased coordination through LUE.  See full details below.  Did have increased difficulty transitioning from tall kneeling to standing and had to roll to side then sit up to prevent fall.  Pt ambulated back to room >150' with [facility] at min/guard to intermittent min assist for mild LOB.  Provided verbal and demonstration cues for increased forward translation of tibia over foot when transitioning from midstance to terminal phase of gait.  Also provided cues for increased heel strike and increased hip/knee flex in order to decrease circumduction.  Pt able to correct, however did note increased fatigue of LLE when ambulating to/from restroom in room.  Pt returned to bed in supine at supervision.  Left with bed alarm set and all needs in reach.   Therapy Documentation Precautions:  Precautions Precautions: Fall Precaution Comments: HIGH BLOOD PRESSURE!!! Restrictions Weight Bearing Restrictions: No   Vital Signs: Therapy Vitals Temp: 98.4 F (36.9 C) Temp src: Oral Pulse Rate: 62 Resp: 17 BP: 129/75 mmHg Patient Position, if appropriate: Lying Oxygen Therapy SpO2: 98 % O2 Device: None (Room air)   Locomotion : Ambulation Ambulation/Gait Assistance: 4: Min assist Wheelchair Mobility Distance: 150    Other Treatments: Treatments Therapeutic Activity: Performed tall kneeling activity for increased WB through LLE while performing lateral weight shifts to L side while reaching L hand forward/diagonal to  cones and placing them by his side.  Requires min manual facilitation for increased weight shift to L and also for increased glute activation during task.  Transitioned to quadruped activity while performing R and L weight shifts.  Pt with mild LOB to R, but able to correct with assist and cues.   Neuromuscular Facilitation: Left;Upper Extremity;Lower Extremity;Forced use;Activity to increase coordination;Activity to increase motor control;Activity to increase timing and sequencing;Activity to increase sustained activation;Activity to increase lateral weight shifting Weight Bearing Technique Weight Bearing Technique: Yes RUE Weight Bearing Technique: Quadruped;High kneeling LUE Weight Bearing Technique: Quadruped;High kneeling Response to Weight Bearing Technique: Pt with some pain in low back and R shoulder, better with rest  See FIM for current functional status  Therapy/Group: Individual Therapy  Vista Deck 07/12/2013, 4:28 PM

## 2013-07-12 NOTE — Progress Notes (Signed)
This note has been reviewed and this clinician agrees with information provided.  

## 2013-07-12 NOTE — Plan of Care (Signed)
Problem: RH BLADDER ELIMINATION Goal: RH STG MANAGE BLADDER WITH ASSISTANCE STG Manage Bladder With Mod I  Outcome: Progressing No incontinent episode reported

## 2013-07-12 NOTE — Progress Notes (Signed)
Physical Therapy Note  Patient Details  Name: Paul Reeves MRN: [id number] Date of Birth: 1986/01/10 Today's Date: 07/12/2013  Time: 730-825 55 minutes  1:1 No c/o pain.  Pt with increased lethargy today throughout session, required frequent rest breaks.  RN made aware.  Gait training household gait with close supervision for obstacle negotiation, improved balance reactions with LBQC today.  Controlled environment gait with close supervision, requiring min A when more fatigued (after 100').  Pt with increased scissoring and more delayed balance reactions when fatigued.  Standing NMR focusing on hip extensor control and endurance.  Standing reaching tasks while favoring L LE, eccentric hip extension exercises, kinetron x 4 minutes.  Pt limited more by fatigue this session, progressing balance during gait.   Paul Reeves 07/12/2013, 8:24 AM

## 2013-07-12 NOTE — Progress Notes (Signed)
Subjective/Complaints:  feels that change in norvasc timing has helped his pm energy and fatigue. Slept well last night.  A 12 point review of systems has been performed and if not noted above is otherwise negative.   Objective: Vital Signs: Blood pressure 134/86, pulse 64, temperature 98.3 F (36.8 C), temperature source Oral, resp. rate 17, weight 96.163 kg (212 lb), SpO2 98.00%. No results found. No results found for this basename: WBC, HGB, HCT, PLT,  in the last 72 hours No results found for this basename: NA, K, CL, CO, GLUCOSE, BUN, CREATININE, CALCIUM,  in the last 72 hours CBG (last 3)  No results found for this basename: GLUCAP,  in the last 72 hours  Wt Readings from Last 3 Encounters:  07/03/13 96.163 kg (212 lb)  06/26/13 101.9 kg (224 lb 10.4 oz)    Physical Exam:  Nursing note and vitals reviewed.  Constitutional: He is oriented to person, place, and time. He appears well-developed and well-nourished.  HENT:  Head: Normocephalic and atraumatic.  Eyes: Conjunctivae are normal. Pupils are equal, round, and reactive to light.  Neck: Normal range of motion. Neck supple.  Cardiovascular: Normal rate and regular rhythm. No murmur  Pulmonary/Chest: Effort normal and breath sounds normal. No respiratory distress. He has no wheezes.  Abdominal: Soft. Bowel sounds are normal. He exhibits no distension. There is tenderness.  Musculoskeletal: He exhibits no edema and no tenderness.  Neurological: He is alert and oriented to person, place, and time.  Speech clear. Left facial weakness persists but better. Left hemiparesis with sensory deficits. LUE is 3 deltoid, 3/5 bic, tric, HI. LLE is 3/5 HF, KE; ADF tr, APF 2-. Sensation 1/2 left arm and leg. Mild left facial weakness, visual fields intact.  Skin: Skin is warm and dry.  Psychiatric: He has a normal mood and affect. His behavior is normal. Judgment and thought content normal.    Assessment/Plan: 1. Functional deficits  secondary to right basal ganglia/sub insular IPH which require 3+ hours per day of interdisciplinary therapy in a comprehensive inpatient rehab setting. Physiatrist is providing close team supervision and 24 hour management of active medical problems listed below. Physiatrist and rehab team continue to assess barriers to discharge/monitor patient progress toward functional and medical goals.  AFO LLE   FIM: FIM - Bathing Bathing Steps Patient Completed: Chest;Abdomen;Front perineal area;Left upper leg;Buttocks;Left Arm;Right lower leg (including foot);Right upper leg;Left lower leg (including foot);Right Arm Bathing: 5: Set-up assist to: Obtain items  FIM - Upper Body Dressing/Undressing Upper body dressing/undressing steps patient completed: Thread/unthread right sleeve of pullover shirt/dresss;Thread/unthread left sleeve of pullover shirt/dress;Put head through opening of pull over shirt/dress;Pull shirt over trunk Upper body dressing/undressing: 5: Supervision: Safety issues/verbal cues FIM - Lower Body Dressing/Undressing Lower body dressing/undressing steps patient completed: Thread/unthread right underwear leg;Thread/unthread left underwear leg;Thread/unthread right pants leg;Thread/unthread left pants leg;Don/Doff right shoe;Fasten/unfasten right shoe;Don/Doff left shoe;Fasten/unfasten left shoe;Pull pants up/down;Pull underwear up/down;Don/Doff left sock;Don/Doff right sock Lower body dressing/undressing: 5: Set-up assist to: Don/Doff AFO/prosthesis/orthosis  FIM - Toileting Toileting steps completed by patient: Performs perineal hygiene;Adjust clothing prior to toileting;Adjust clothing after toileting Toileting Assistive Devices: Grab bar or rail for support Toileting: 4: Steadying assist  FIM - Diplomatic Services operational officer Devices: Grab bars;Occupational hygienist Transfers: 4-To toilet/BSC: Min A (steadying Pt. > 75%);4-From toilet/BSC: Min A (steadying Pt. > 75%)  FIM -  Bed/Chair Transfer Bed/Chair Transfer Assistive Devices: Arm rests;Cane;Orthosis Bed/Chair Transfer: 5: Supine > Sit: Supervision (verbal cues/safety issues);5: Chair or W/C >  Bed: Supervision (verbal cues/safety issues);5: Bed > Chair or W/C: Supervision (verbal cues/safety issues)  FIM - Locomotion: Wheelchair Locomotion: Wheelchair: 0: Activity did not occur FIM - Locomotion: Ambulation Locomotion: Ambulation Assistive Devices: Cane - Quad;Orthosis Ambulation/Gait Assistance: 4: Min assist Locomotion: Ambulation: 4: Travels 150 ft or more with minimal assistance (Pt.>75%)  Comprehension Comprehension Mode: Auditory Comprehension: 7-Follows complex conversation/direction: With no assist  Expression Expression Mode: Verbal Expression: 7-Expresses complex ideas: With no assist  Social Interaction Social Interaction: 7-Interacts appropriately with others - No medications needed.  Problem Solving Problem Solving: 7-Solves complex problems: Recognizes & self-corrects  Memory Memory: 7-Complete Independence: No helper  Medical Problem List and Plan:  1. DVT Prophylaxis/Anticoagulation: Mechanical: Antiembolism stockings, knee (TED hose) Bilateral lower extremities  Sequential compression devices, below knee Bilateral lower extremities  2. Pain Management:  prn vicodin for headaches. Improved. 3. Mood: Will have LCSW follow for evaluation. Poor insight with impaired awareness at this time. Will monitor.  4. Neuropsych: This patient is capable of making decisions on his own behalf.  5. Malignant HTN: adjusted scheduling of labetalol   -added diuretic and changed scheduling of norvasc also to help am pressures and avoid afternoon hypotension and fatigue during the day. BP is improving---continue current plan  LOS (Days) 11 A FACE TO FACE EVALUATION WAS PERFORMED  SWARTZ,ZACHARY T 07/12/2013 8:20 AM

## 2013-07-12 NOTE — Plan of Care (Signed)
Problem: RH BOWEL ELIMINATION Goal: RH STG MANAGE BOWEL WITH ASSISTANCE STG Manage Bowel with mod independent  Outcome: Progressing No incontinent of bowel reported

## 2013-07-13 ENCOUNTER — Inpatient Hospital Stay (HOSPITAL_COMMUNITY): Payer: BC Managed Care – PPO | Admitting: Physical Therapy

## 2013-07-13 NOTE — Progress Notes (Signed)
Physical Therapy Session Note  Patient Details  Name: Paul Reeves MRN: [id number] Date of Birth: 01-06-1986  Today's Date: 07/13/2013 Time: 4098-1191 Time Calculation (min): 27 min  Short Term Goals: Week 1:  PT Short Term Goal 1 (Week 1): Pt will perform functional transfers with min A PT Short Term Goal 1 - Progress (Week 1): Met PT Short Term Goal 2 (Week 1): Pt will gait with mod A in controlled environment 25' PT Short Term Goal 2 - Progress (Week 1): Met  Skilled Therapeutic Interventions/Progress Updates:  Pt was seen bedside in the am. Pt ambulated from toilet to edge of bed with quad cane and min guard. Pt ambulating without shoes, only white socks. Pt educated yon the need to utilize at the very least slipping socks when ambulating in room to decrease risk of LOB. Pt verbalized understanding. BP 118/77 this am taken by nsg. Donned B shoes and L orthosis. Pt ambulated 150 feet to gym with [facility] and min guard with occasional verbal cues. BP sitting in gym was 105/69, attempting standing vitals, pt became dizzy unable to get vitals in standing but immediately after sitting down BP 94/64. Pt transferred from w/c to mat with min A. Pt transferred edge of mat to supine with S. BP in supine was 127/81, with decreased c/o dizziness. Pt's nurse was notified. After 5 mins pt BP was 115/78 in supine. Pt transferred supine to edge of mat with S. Pt transferred edge of mat to w/c with min guard. Pt returned to room and transferred back in bed with min guard. Nursing notified back in bed with no complaints of dizziness.   Therapy Documentation Precautions:  Precautions Precautions: Fall Precaution Comments: HIGH BLOOD PRESSURE!!! Restrictions Weight Bearing Restrictions: No General:   Vital Signs: Pain: No c/o  Pain.    Locomotion : Ambulation Ambulation/Gait Assistance: 4: Min guard   See FIM for current functional status  Therapy/Group: Individual Therapy  Rayford Halsted 07/13/2013, 12:48 PM

## 2013-07-13 NOTE — Progress Notes (Signed)
Patient ID: Paul Reeves, male   DOB: 1986/07/07, 27 y.o.   MRN: [id number] Subjective/Complaints:  10/18.  27 y/o with multidrug resistant HTN who was admitted following ICH. Slept well last night.  Patient has had orthostatic dizziness when getting OOB in the early am. Walks with a cane    Objective: Vital Signs: Blood pressure 118/77, pulse 79, temperature 97.3 F (36.3 C), temperature source Oral, resp. rate 20, weight 96.163 kg (212 lb), SpO2 98.00%. No results found. No results found for this basename: WBC, HGB, HCT, PLT,  in the last 72 hours No results found for this basename: NA, K, CL, CO, GLUCOSE, BUN, CREATININE, CALCIUM,  in the last 72 hours CBG (last 3)  No results found for this basename: GLUCAP,  in the last 72 hours  Wt Readings from Last 3 Encounters:  07/03/13 96.163 kg (212 lb)  06/26/13 101.9 kg (224 lb 10.4 oz)   Patient Vitals for the past 24 hrs:  BP Temp Temp src Pulse Resp SpO2  07/13/13 0747 118/77 mmHg - - 79 - -  07/13/13 0402 133/87 mmHg 97.3 F (36.3 C) Oral 63 20 98 %  07/12/13 2357 141/84 mmHg 97.6 F (36.4 C) Oral 65 20 98 %  07/12/13 1953 145/88 mmHg 97.5 F (36.4 C) Oral 67 18 97 %  07/12/13 1601 129/75 mmHg 98.4 F (36.9 C) Oral 62 17 98 %  07/12/13 1100 141/84 mmHg 98 F (36.7 C) Oral 58 17 99 %    Intake/Output Summary (Last 24 hours) at 07/13/13 4010 Last data filed at 07/12/13 1809  Gross per 24 hour  Intake    720 ml  Output      0 ml  Net    720 ml   Physical Exam:   Constitutional: He is oriented to person, place, and time. He appears well-developed and well-nourished.  HENT:  Head: Normocephalic and atraumatic.  Eyes: Conjunctivae are normal. Pupils are equal, round, and reactive to light.  Neck: Normal range of motion. Neck supple.  Cardiovascular: Normal rate and regular rhythm. No murmur  Pulmonary/Chest: Effort normal and breath sounds normal. No respiratory distress. He has no wheezes.  Abdominal: Soft. Bowel  sounds are normal. He exhibits no distension. There is tenderness.  Musculoskeletal: He exhibits no edema and no tenderness.  Neurological: He is alert and oriented to person, place, and time.  Speech clear. Left facial weakness persists but better. Left hemiparesis with sensory deficits. Skin: Skin is warm and dry.  Psychiatric: He has a normal mood and affect. His behavior is normal. Judgment and thought content normal.  Extr- TED hose in place   Assessment/Plan: 1. Functional deficits secondary to right basal ganglia/sub insular IPH which require 3+ hours per day of interdisciplinary therapy in a comprehensive inpatient rehab setting.  2. Malignant HTN:  BP is low normal this am and assoc with some orthostatic dizziness; will hold HCTZ and continue to follow closely   LOS (Days) 12 A FACE TO FACE EVALUATION WAS PERFORMED  Rogelia Boga 07/13/2013 9:25 AM

## 2013-07-13 NOTE — Progress Notes (Signed)
Physical Therapy Note  Patient Details  Name: Zohan Shiflet MRN: [id number] Date of Birth: 1986-07-02 Today's Date: 07/13/2013  1400-1455 (55 minutes) group Pain: no reported pain Other: BP standing 121/73  , sitting 133/81 with no symptoms Pt participated in PT Group session focused on gait training, safety, activity tolerance. Pt ambulates x 2 80 feet with LBQC min to close SBA.    Aarish Rockers,JIM 07/13/2013, 3:29 PM

## 2013-07-14 ENCOUNTER — Inpatient Hospital Stay (HOSPITAL_COMMUNITY): Payer: BC Managed Care – PPO | Admitting: Occupational Therapy

## 2013-07-14 ENCOUNTER — Inpatient Hospital Stay (HOSPITAL_COMMUNITY): Payer: BC Managed Care – PPO | Admitting: Physical Therapy

## 2013-07-14 NOTE — Progress Notes (Signed)
Occupational Therapy Session Note  Patient Details  Name: Paul Reeves MRN: [id number] Date of Birth: [birth year]  Today's Date: 07/14/2013 Time: 1500-1530 Time Calculation (min): 30 min   Skilled Therapeutic Interventions/Progress Updates: Patient stated, "I am so sleepy and cannot focus on doing any therapy because I think my blood pressure is low."   He concurred to participate in right UE neuro reeducation and ROM lying supine in bed.   Patient appeared to tolerate the session well without outward signs of low BP or discomfort.   His mother was present for the session.    Therapy Documentation Precautions:  Precautions Precautions: Fall Precaution Comments: HIGH BLOOD PRESSURE!!! Restrictions Weight Bearing Restrictions: No  Pain:denied   See FIM for current functional status  Therapy/Group: Individual Therapy  Bud Face [facility] 07/14/2013, 4:09 PM

## 2013-07-14 NOTE — Progress Notes (Signed)
Physical Therapy Note  Patient Details  Name: Manraj Yeo MRN: [id number] Date of Birth: 1986-09-18 Today's Date: 07/14/2013  1300-1325 (25 minutes) individual Pain: no reported pain Focus of treatment: gait training/safety/endurance Treatment: Gait on tile 150 feet with LBQC + left AFO close SBA ; gait on carpet 75 feet as previous SBA with mod mild toe drag on left; stepping over obstacle (threshold) SBA with vcs for sequencing; stepping up (hip flexion) on left LE to 4 inch step with foot drag stepping down secondary to minimal hamstring activation; prone AA hamstring curls on left 2 X 10.    Rithwik Schmieg,JIM 07/14/2013, 12:53 PM

## 2013-07-14 NOTE — Progress Notes (Signed)
Patient ID: Paul Reeves, male   DOB: 14-Sep-1986, 27 y.o.   MRN: [id number] Patient ID: Paul Reeves, male   DOB: [birth year], 27 y.o.   MRN: [id number] Subjective/Complaints:  10/19.  27 y/o with multidrug resistant HTN who was admitted following ICH. Slept well last night.  Patient has had orthostatic dizziness when getting OOB in the early am.  No complaints today Walks with a cane    Objective: Vital Signs: Blood pressure 119/77, pulse 103, temperature 98.2 F (36.8 C), temperature source Oral, resp. rate 18, weight 96.163 kg (212 lb), SpO2 99.00%. No results found. No results found for this basename: WBC, HGB, HCT, PLT,  in the last 72 hours No results found for this basename: NA, K, CL, CO, GLUCOSE, BUN, CREATININE, CALCIUM,  in the last 72 hours CBG (last 3)  No results found for this basename: GLUCAP,  in the last 72 hours  Wt Readings from Last 3 Encounters:  07/03/13 96.163 kg (212 lb)  06/26/13 101.9 kg (224 lb 10.4 oz)   Patient Vitals for the past 24 hrs:  BP Temp Temp src Pulse Resp SpO2  07/14/13 0840 119/77 mmHg - - - - -  07/14/13 0617 125/69 mmHg - - - - -  07/14/13 0616 146/95 mmHg - - - - -  07/14/13 0615 138/92 mmHg 98.2 F (36.8 C) Oral 103 18 99 %  07/13/13 2100 - - - 72 - -  07/13/13 1933 129/72 mmHg 98 F (36.7 C) Oral 54 18 99 %  07/13/13 1516 122/83 mmHg 98.5 F (36.9 C) Oral 75 - 98 %  07/13/13 1221 129/79 mmHg - - - - -    Intake/Output Summary (Last 24 hours) at 07/14/13 0902 Last data filed at 07/13/13 1857  Gross per 24 hour  Intake    840 ml  Output      0 ml  Net    840 ml   Physical Exam:   Constitutional: He is oriented to person, place, and time. He appears well-developed and well-nourished.  HENT:  Head: Normocephalic and atraumatic.  Eyes: Conjunctivae are normal. Pupils are equal, round, and reactive to light.  Neck: Normal range of motion. Neck supple.  Cardiovascular: Normal rate and regular rhythm. No murmur   Pulmonary/Chest: Effort normal and breath sounds normal. No respiratory distress. He has no wheezes.  Abdominal: Soft. Bowel sounds are normal. He exhibits no distension. There is tenderness.  Musculoskeletal: He exhibits no edema and no tenderness.  Neurological: He is alert and oriented to person, place, and time.  Speech clear. Left facial weakness persists but better. Left hemiparesis with sensory deficits. Skin: Skin is warm and dry.   Extr- TED hose in place   Assessment/Plan: 1. Functional deficits secondary to right basal ganglia/sub insular IPH which require 3+ hours per day of interdisciplinary therapy in a comprehensive inpatient rehab setting.  2. Malignant HTN:  BP is low normal this am and assoc with some orthostatic dizziness; will hold HCTZ and continue to follow closely   LOS (Days) 13 A FACE TO FACE EVALUATION WAS PERFORMED  Rogelia Boga 07/14/2013 9:02 AM

## 2013-07-15 ENCOUNTER — Inpatient Hospital Stay (HOSPITAL_COMMUNITY): Payer: BC Managed Care – PPO | Admitting: Occupational Therapy

## 2013-07-15 ENCOUNTER — Inpatient Hospital Stay (HOSPITAL_COMMUNITY): Payer: BC Managed Care – PPO

## 2013-07-15 ENCOUNTER — Inpatient Hospital Stay (HOSPITAL_COMMUNITY): Payer: BC Managed Care – PPO | Admitting: Physical Therapy

## 2013-07-15 NOTE — Progress Notes (Signed)
Occupational Therapy Session Note  Patient Details  Name: Paul Reeves MRN: [id number] Date of Birth: 01/24/1986  Today's Date: 07/15/2013  Short Term Goals: Week 2:  OT Short Term Goal 1 (Week 2): Pt will require supervision for dynamic standing balance during clothing management. OT Short Term Goal 2 (Week 2): Pt will use LUE only for sit to stand transfers 100% of the time.  OT Short Term Goal 3 (Week 2): Pt will require no more than min verbal cues for dynamic standing tasks.   Skilled Therapeutic Interventions/Progress Updates:   First Session: Individual Time: 0900-1000 Time Calculation: 60 minutes  Pt engaged in 1:1 self-care session focused on dynamic standing balance, NMR of LUE.  RN in at start of session. Vitals WNL at that time.  No complaints of pain/dizziness this session. Pt ambulated with quad cane/min A to shower, no LOB.  Bathed <75% of body with LUE, no LOB for dynamic standing tasks throughout session.  Improved safety awareness/decision-making for ADL tasks.  Dressed from edge of w/c,   Set-up for donning ted hose. Able to don AFO with supervision.  Pt will call mother today to inquire about potential times for family education session this week.  Pt effectively integrates LUE in most functional tasks without prompting.    Second Session: Individual Time: 1478-2956 Time Calculation: 45 minutes  Pt engaged in session focused on floor transfer/fall recovery, and NMR of LUE.  Educated on/engaged in transfer from standing position to supine position on mat placed on floor, min A required and assist with technique.  Pt states he was sleeping on the floor prior to admission, educated on the use of technique for this purpose or to safely get up after a fall. Remainder of session, pt engaged in reaching, pinching, and placing graded clothespins with LUE in various planes. Task initiated with pt in tall-kneeling on mat, 2 seated rest breaks required as pt became hot and  uncomfortable.  BP 121/81 at that time.  Activity resumed seated edge of mat.  No complaints of pain.      Therapy Documentation Precautions:  Precautions Precautions: Fall Precaution Comments: HIGH BLOOD PRESSURE!!! Restrictions Weight Bearing Restrictions: No  Pain: None.  See FIM for current functional status  Therapy/Group: Individual Therapy  Kandis Ban 07/15/2013, 9:37 AM

## 2013-07-15 NOTE — Progress Notes (Signed)
This note has been reviewed and this clinician agrees with information provided.  

## 2013-07-15 NOTE — Progress Notes (Signed)
Occupational Therapy Session Note  Patient Details  Name: Paul Reeves MRN: [id number] Date of Birth: 02-Dec-1985  Today's Date: 07/15/2013 Time: 4132-4401 Time Calculation (min): 30 min  Short Term Goals: Week 2:  OT Short Term Goal 1 (Week 2): Pt will require supervision for dynamic standing balance during clothing management. OT Short Term Goal 2 (Week 2): Pt will use LUE only for sit to stand transfers 100% of the time.  OT Short Term Goal 3 (Week 2): Pt will require no more than min verbal cues for dynamic standing tasks.   Skilled Therapeutic Interventions/Progress Updates: Therapeutic activity with emphasis on dynamic standing balance and NMR of L-UE during functional tasks.   Patient participated in the following activities:1)  Re-construction of pre-fitted PVC pipe parts assembly using visual aid for guidance and left hand as dominant hand while standing at table top  2) Participating in [employer] play while standing and managing game pieces with left hand.   Patient demonstrated excellent participation in activity with need for one rest break.   Patient was re-educated on methods to reduce compensatory recruitment of excessive shoulder elevation and shoulder abduction while locating, reaching, and placing parts and game pieces.   OT demonstrated use of weighted wrist band to provide improved proprioception at L-UE and hand guidance to normalize movement patterns during activity.   Patient reported and demonstrated benefit of reduced abduction of shoulder with use of wrist band during game play.     Therapy Documentation Precautions:  Precautions Precautions: Fall Precaution Comments: HIGH BLOOD PRESSURE!!! Restrictions Weight Bearing Restrictions: No  Pain: Pain Assessment Pain Assessment: No/denies pain  See FIM for current functional status  Therapy/Group: Individual Therapy  Safiyah Cisney 07/15/2013, 2:54 PM

## 2013-07-15 NOTE — Progress Notes (Signed)
Subjective/Complaints:  feels that change in norvasc timing has helped his pm energy and fatigue. Slept well last night.  A 12 point review of systems has been performed and if not noted above is otherwise negative.   Objective: Vital Signs: Blood pressure 105/61, pulse 99, temperature 98.1 F (36.7 C), temperature source Oral, resp. rate 18, weight 96.163 kg (212 lb), SpO2 99.00%. No results found. No results found for this basename: WBC, HGB, HCT, PLT,  in the last 72 hours No results found for this basename: NA, K, CL, CO, GLUCOSE, BUN, CREATININE, CALCIUM,  in the last 72 hours CBG (last 3)  No results found for this basename: GLUCAP,  in the last 72 hours  Wt Readings from Last 3 Encounters:  07/03/13 96.163 kg (212 lb)  06/26/13 101.9 kg (224 lb 10.4 oz)    Physical Exam:  Nursing note and vitals reviewed.  Constitutional: He is oriented to person, place, and time. He appears well-developed and well-nourished.  HENT:  Head: Normocephalic and atraumatic.  Eyes: Conjunctivae are normal. Pupils are equal, round, and reactive to light.  Neck: Normal range of motion. Neck supple.  Cardiovascular: Normal rate and regular rhythm. No murmur  Pulmonary/Chest: Effort normal and breath sounds normal. No respiratory distress. He has no wheezes.  Abdominal: Soft. Bowel sounds are normal. He exhibits no distension. There is tenderness.  Musculoskeletal: He exhibits no edema and no tenderness.  Neurological: He is alert and oriented to person, place, and time.  Speech clear. Left facial weakness persists but better. Left hemiparesis with sensory deficits. LUE is 3 deltoid, 3/5 bic, tric, HI. LLE is 3/5 HF, KE; ADF tr, APF 2-. Sensation 1/2 left arm and leg. Mild left facial weakness, visual fields intact.  Skin: Skin is warm and dry.  Psychiatric: He has a normal mood and affect. His behavior is normal. Judgment and thought content normal.    Assessment/Plan: 1. Functional deficits  secondary to right basal ganglia/sub insular IPH which require 3+ hours per day of interdisciplinary therapy in a comprehensive inpatient rehab setting. Physiatrist is providing close team supervision and 24 hour management of active medical problems listed below. Physiatrist and rehab team continue to assess barriers to discharge/monitor patient progress toward functional and medical goals.  AFO LLE--delivery today?   FIM: FIM - Bathing Bathing Steps Patient Completed: Chest;Abdomen;Front perineal area;Left upper leg;Buttocks;Left Arm;Right lower leg (including foot);Right upper leg;Left lower leg (including foot);Right Arm Bathing: 4: Steadying assist  FIM - Upper Body Dressing/Undressing Upper body dressing/undressing steps patient completed: Thread/unthread right sleeve of pullover shirt/dresss;Thread/unthread left sleeve of pullover shirt/dress;Put head through opening of pull over shirt/dress;Pull shirt over trunk Upper body dressing/undressing: 5: Supervision: Safety issues/verbal cues FIM - Lower Body Dressing/Undressing Lower body dressing/undressing steps patient completed: Thread/unthread right underwear leg;Thread/unthread left underwear leg;Thread/unthread right pants leg;Thread/unthread left pants leg;Don/Doff right shoe;Fasten/unfasten right shoe;Don/Doff left shoe;Fasten/unfasten left shoe;Pull pants up/down;Pull underwear up/down;Don/Doff left sock;Don/Doff right sock Lower body dressing/undressing: 4: Steadying Assist  FIM - Toileting Toileting steps completed by patient: Adjust clothing prior to toileting;Performs perineal hygiene;Adjust clothing after toileting Toileting Assistive Devices: Grab bar or rail for support Toileting: 5: Supervision: Safety issues/verbal cues  FIM - Diplomatic Services operational officer Devices: Grab bars;Cane Toilet Transfers: 5-To toilet/BSC: Supervision (verbal cues/safety issues);5-From toilet/BSC: Supervision (verbal cues/safety  issues)  FIM - Press photographer Assistive Devices: Cane;Orthosis Bed/Chair Transfer: 5: Bed > Chair or W/C: Supervision (verbal cues/safety issues);5: Chair or W/C > Bed: Supervision (verbal cues/safety issues)  FIM - Locomotion: Wheelchair Distance: 150 Locomotion: Wheelchair: 6: Travels 150 ft or more, turns around, maneuvers to table, bed or toilet, negotiates 3% grade: maneuvers on rugs and over door sills independently FIM - Locomotion: Ambulation Locomotion: Ambulation Assistive Devices: Cane - Quad;Orthosis Ambulation/Gait Assistance: 4: Min guard Locomotion: Ambulation: 4: Travels 150 ft or more with minimal assistance (Pt.>75%)  Comprehension Comprehension Mode: Auditory Comprehension: 7-Follows complex conversation/direction: With no assist  Expression Expression Mode: Verbal Expression: 7-Expresses complex ideas: With no assist  Social Interaction Social Interaction: 7-Interacts appropriately with others - No medications needed.  Problem Solving Problem Solving: 7-Solves complex problems: Recognizes & self-corrects  Memory Memory: 7-Complete Independence: No helper  Medical Problem List and Plan:  1. DVT Prophylaxis/Anticoagulation: Mechanical: Antiembolism stockings, knee (TED hose) Bilateral lower extremities  Sequential compression devices, below knee Bilateral lower extremities  2. Pain Management:  prn vicodin for headaches. Improved. 3. Mood: Will have LCSW follow for evaluation. Poor insight with impaired awareness at this time. Will monitor.  4. Neuropsych: This patient is capable of making decisions on his own behalf.  5. Malignant HTN: bp has dropped quite a bit over the last few days   -norvasc hs, lotensin bid,  -hctz held due to dropping bp. Stop clonidine. Consider decreasing labetalol also  LOS (Days) 14 A FACE TO FACE EVALUATION WAS PERFORMED  Paul Reeves 07/15/2013 8:22 AM

## 2013-07-15 NOTE — Progress Notes (Signed)
Physical Therapy Note  Patient Details  Name: Paul Reeves MRN: [id number] Date of Birth: April 21, 1986 Today's Date: 07/15/2013  Time: 730-825 55 minutes  1:1 No c/o pain.  Gait training with [facility] in home and controlled environments with supervision, pt with improved balance reactions, good safety awareness with obstacles.  Pt continues to circumduct L hip for foot clearance in gait, improves with cuing.  Quadruped and tall kneeling exercises for core, glute and hip strengthening with min A for obtaining position, tactile cues for core and glute contractions.  Standing tap up to 4'' cone with R LE focusing on L stance with min manual cues.  Squatting to reach and pick up objects with  L UE with supervision.  Pt continues with decreased activity tolerance requiring frequent rest breaks.  BP 127/78 during session.   DONAWERTH,KAREN 07/15/2013, 8:26 AM

## 2013-07-15 NOTE — Consult Note (Signed)
NEUROCOGNITIVE TESTING - [facility]   [name] is a 27 year old, right-handed, African American man, who was seen for a brief neuropsychological assessment to evaluate cognitive and emotional functioning post-stroke.  According to his medical record, he was admitted on 06/26/13 with left sided weakness, difficulty talking, and inability to move.  CT of his head revealed right basal ganglia, sub insular acute IPH with local mass effect.    PROCEDURES: [3 units of 16109 on 07/10/2013]  The following tests were performed during today's visit: Mini Mental Status Examination (MMSE-2) - Brief version, Repeatable Battery for the Assessment of Neuropsychological Status (RBANS, form A), Beck Depression Inventory - 2 (fast screen for medical patients).  Test results are as follows:   MMSE-2 brief Raw Score = 16/16 Description = WNL   RBANS Indices Scaled Score Percentile Description  Immediate Memory  90 25 Average  Visuospatial/Constructional 75 5 Borderline  Language 104 61 Average  Attention 60 < 1 Profoundly Impaired  Delayed Memory 101 53 Average  Total Score 81 10 Below Average   RBANS Subtests Raw Score Percentile Description  List Learning 34 77 Above Average  Story Memory 14 6 Borderline  Figure Copy 18 19 Below Average  Line Orientation 13 10 Below Average  Picture Naming 10 70 Average  Semantic Fluency 24 73 Average  Digit Span 8 7 Borderline  Coding 35 1 Profoundly Impaired   List Recall 10 91 Superior  List Recognition 20 61 Average  Story Recall 9 30 Average  Figure recall 18 75 Average   BDI - 2 (fast) Raw Score = 0 Description = WNL   RESULTS AND IMPRESSIONS: Test results revealed reduced performances in visuospatial abilities and attention.  Regarding memory, his performances were generally intact, though he demonstrated trouble with initial ability to encode contextual information presented auditorially.  The areas of  weakness seen on testing are consistent with the area of the brain affected by his stroke.  As such, they are likely representative of direct sequelae from that event.  However, given Paul Reeves young age, this screening instrument may not be the most sensitive way to determine his true cognitive strengths and weaknesses.  As such, more thorough neuropsychological evaluation is recommended immediately upon discharge in order to establish a more accurate assessment of his true cognitive abilities at this time.  Paul Reeves was agreeable to completing this.    In light of these findings, the following recommendations are provided.    RECOMMENDATIONS:  Recommendations for treatment team:     Paul Reeves demonstrated limited attention, possibly secondary to fatigue.  When interacting with him, directions and information should be provided in a simple, straight forward manner, and the treatment team should avoid giving multiple instructions simultaneously.    To the extent possible, multitasking should be avoided.   Performance will generally be best in a structured, routine, and familiar environment, as opposed to situations involving complex problems.   Recommendations for discharge planning:     Complete a comprehensive neuropsychological evaluation as an outpatient as soon as possible following discharge to obtain a more accurate picture of his cognitive abilities.  To schedule this with Paul Reeves, he could contact her at: [phone].  This contact information should be provided to him upon discharge.     Maintain engagement in mentally, physically and cognitively stimulating activities.    Strive to maintain a healthy lifestyle (e.g., proper diet and exercise) in order to promote physical, cognitive and emotional health.  Leavy Cella, Psy.D.  Clinical Neuropsychologist

## 2013-07-16 ENCOUNTER — Inpatient Hospital Stay (HOSPITAL_COMMUNITY): Payer: BC Managed Care – PPO | Admitting: Occupational Therapy

## 2013-07-16 ENCOUNTER — Inpatient Hospital Stay (HOSPITAL_COMMUNITY): Payer: BC Managed Care – PPO | Admitting: Physical Therapy

## 2013-07-16 ENCOUNTER — Inpatient Hospital Stay (HOSPITAL_COMMUNITY): Payer: BC Managed Care – PPO

## 2013-07-16 NOTE — Progress Notes (Signed)
Occupational Therapy Session Note  Patient Details  Name: Paul Reeves MRN: [id number] Date of Birth: 09/29/1985  Today's Date: 07/16/2013 Time:1400-1445 Time Calculation: 45 minutes  Short Term Goals: Week 2:  OT Short Term Goal 1 (Week 2): Pt will require supervision for dynamic standing balance during clothing management. OT Short Term Goal 2 (Week 2): Pt will use LUE only for sit to stand transfers 100% of the time.  OT Short Term Goal 3 (Week 2): Pt will require no more than min verbal cues for dynamic standing tasks.   Skilled Therapeutic Interventions/Progress Updates:    Pt asleep upon entry to room, slow to wake and sit EOB to don shoes.  Complained of sneezing and coughing/congesion.  BP 152/95, temp 97.6 at that time.  PA aware of pt complaints and present to obtain vital signs.  Session in ADL apartment to address functional transfers and functional mobility in bathroom and kitchen area.  Pt completed tub/shower transfer from w/c to tub bench and from w/c to toilet (supervision for both transfers).  Discussion about bathroom set-up to determine equipment needs for d/c.  Pt demonstrated abilities to propel w/c in kitchen and reach basic items from counters, fridge, stove, and microwave to meet needs at home when mother not present.  Educated on safety of completing all tasks from w/c level for safety.  Pt verbalized understanding.  Transferred back to bed at end of session.   Therapy Documentation Precautions:  Precautions Precautions: Fall Precaution Comments: HIGH BLOOD PRESSURE!!! Restrictions Weight Bearing Restrictions: No   See FIM for current functional status  Therapy/Group: Individual Therapy  Kandis Ban 07/16/2013, 3:40 PM

## 2013-07-16 NOTE — Progress Notes (Signed)
Subjective/Complaints:  no complaints. Feels that he's progressing well. Excited about going home Friday.  A 12 point review of systems has been performed and if not noted above is otherwise negative.   Objective: Vital Signs: Blood pressure 133/90, pulse 72, temperature 98.4 F (36.9 C), temperature source Oral, resp. rate 20, weight 96.163 kg (212 lb), SpO2 98.00%. No results found. No results found for this basename: WBC, HGB, HCT, PLT,  in the last 72 hours No results found for this basename: NA, K, CL, CO, GLUCOSE, BUN, CREATININE, CALCIUM,  in the last 72 hours CBG (last 3)  No results found for this basename: GLUCAP,  in the last 72 hours  Wt Readings from Last 3 Encounters:  07/03/13 96.163 kg (212 lb)  06/26/13 101.9 kg (224 lb 10.4 oz)    Physical Exam:  Nursing note and vitals reviewed.  Constitutional: He is oriented to person, place, and time. He appears well-developed and well-nourished.  HENT:  Head: Normocephalic and atraumatic.  Eyes: Conjunctivae are normal. Pupils are equal, round, and reactive to light.  Neck: Normal range of motion. Neck supple.  Cardiovascular: Normal rate and regular rhythm. No murmur  Pulmonary/Chest: Effort normal and breath sounds normal. No respiratory distress. He has no wheezes.  Abdominal: Soft. Bowel sounds are normal. He exhibits no distension. There is tenderness.  Musculoskeletal: He exhibits no edema and no tenderness.  Neurological: He is alert and oriented to person, place, and time.  Speech clear. Left facial weakness persists but better. Left hemiparesis with sensory deficits. LUE is 3 deltoid, 3/5 bic, tric, HI. LLE is 3/5 HF, KE; ADF tr, APF 2-. Sensation 1/2 left arm and leg. Mild left facial weakness, visual fields intact.  Skin: Skin is warm and dry.  Psychiatric: He has a normal mood and affect. His behavior is normal. Judgment and thought content normal.    Assessment/Plan: 1. 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 Physiatrist is providing close team supervision and 24 hour management of active medical problems listed below. Physiatrist and rehab team continue to assess barriers to discharge/monitor patient progress toward functional and medical goals.  AFO LLE--delivered. Pt states it fits well.    FIM: FIM - Bathing Bathing Steps Patient Completed: Chest;Abdomen;Front perineal area;Left upper leg;Buttocks;Left Arm;Right lower leg (including foot);Right upper leg;Left lower leg (including foot);Right Arm Bathing: 4: Steadying assist  FIM - Upper Body Dressing/Undressing Upper body dressing/undressing steps patient completed: Thread/unthread right sleeve of pullover shirt/dresss;Thread/unthread left sleeve of pullover shirt/dress;Put head through opening of pull over shirt/dress;Pull shirt over trunk Upper body dressing/undressing: 5: Supervision: Safety issues/verbal cues FIM - Lower Body Dressing/Undressing Lower body dressing/undressing steps patient completed: Thread/unthread right underwear leg;Thread/unthread left underwear leg;Pull underwear up/down;Thread/unthread right pants leg;Thread/unthread left pants leg;Pull pants up/down;Don/Doff right sock;Don/Doff right shoe;Fasten/unfasten right shoe;Don/Doff left sock;Don/Doff left shoe;Fasten/unfasten left shoe Lower body dressing/undressing: 5: Supervision: Safety issues/verbal cues  FIM - Toileting Toileting steps completed by patient: Adjust clothing prior to toileting;Performs perineal hygiene;Adjust clothing after toileting Toileting Assistive Devices: Grab bar or rail for support Toileting: 5: Supervision: Safety issues/verbal cues  FIM - Diplomatic Services operational officer Devices: Grab bars;Cane Toilet Transfers: 5-To toilet/BSC: Supervision (verbal cues/safety issues);5-From toilet/BSC: Supervision  (verbal cues/safety issues)  FIM - Press photographer Assistive Devices: Cane;Orthosis Bed/Chair Transfer: 5: Chair or W/C > Bed: Supervision (verbal cues/safety issues);5: Bed > Chair or W/C: Supervision (verbal cues/safety  issues)  FIM - Locomotion: Wheelchair Distance: 150 Locomotion: Wheelchair: 6: Travels 150 ft or more, turns around, maneuvers to table, bed or toilet, negotiates 3% grade: maneuvers on rugs and over door sills independently FIM - Locomotion: Ambulation Locomotion: Ambulation Assistive Devices: Cane - Quad;Orthosis Ambulation/Gait Assistance: 4: Min guard Locomotion: Ambulation: 5: Travels 150 ft or more with supervision/safety issues  Comprehension Comprehension Mode: Auditory Comprehension: 7-Follows complex conversation/direction: With no assist  Expression Expression Mode: Verbal Expression: 7-Expresses complex ideas: With no assist  Social Interaction Social Interaction: 7-Interacts appropriately with others - No medications needed.  Problem Solving Problem Solving: 7-Solves complex problems: Recognizes & self-corrects  Memory Memory: 7-Complete Independence: No helper  Medical Problem List and Plan:  1. DVT Prophylaxis/Anticoagulation: Mechanical: Antiembolism stockings, knee (TED hose) Bilateral lower extremities  Sequential compression devices, below knee Bilateral lower extremities  2. Pain Management:  prn vicodin for headaches. Improved. 3. Mood: Will have LCSW follow for evaluation. Poor insight with impaired awareness at this time. Will monitor.  4. Neuropsych: This patient is capable of making decisions on his own behalf.  5. Malignant HTN: bp normotensive for the most part.   - continue labetalol bid, norvasc hs, lotensin bid,  -hctz and clonidine stopped  -adjust further as needed.   LOS (Days) 15 A FACE TO FACE EVALUATION WAS PERFORMED  SWARTZ,ZACHARY T 07/16/2013 8:10 AM

## 2013-07-16 NOTE — Progress Notes (Signed)
Occupational Therapy Session Note  Patient Details  Name: Paul Reeves MRN: [id number] Date of Birth: Aug 29, 1986  Today's Date: 07/16/2013 Time: 0900-0955 Time Calculation (min): 55 min  Short Term Goals: Week 1:  OT Short Term Goal 1 (Week 1): Pt will complete tub/shower transfer with min A OT Short Term Goal 1 - Progress (Week 1): Met OT Short Term Goal 2 (Week 1): Pt will complete LB dressing tasks with min A. OT Short Term Goal 2 - Progress (Week 1): Met OT Short Term Goal 3 (Week 1): Pt will utilize LUE to bathe 50% of body  OT Short Term Goal 3 - Progress (Week 1): Met OT Short Term Goal 4 (Week 1): Pt will require no more than steadying assist for dynamic standing during clothing management.  OT Short Term Goal 4 - Progress (Week 1): Met Week 2:  OT Short Term Goal 1 (Week 2): Pt will require supervision for dynamic standing balance during clothing management. OT Short Term Goal 2 (Week 2): Pt will use LUE only for sit to stand transfers 100% of the time.  OT Short Term Goal 3 (Week 2): Pt will require no more than min verbal cues for dynamic standing tasks.   Skilled Therapeutic Interventions/Progress Updates:    1:1 self care retraining at shower level with focus on functional ambulation with quad cane around room, shower stall transfer, bathing with normal patterns of movement with left UE, gathering all items for ADL with left Ue with extra time. Pt with decreased hiking of shoulder with functional tasks today. Continued practice for donning AFO. Pt able to perform bathing and dressing with supervision with decr need for cuing for safety. Close supervision to steady A for ambulation (especially with turns).   Therapy Documentation Precautions:  Precautions Precautions: Fall Precaution Comments: HIGH BLOOD PRESSURE!!! Restrictions Weight Bearing Restrictions: No Pain:  no c/o pain   See FIM for current functional status  Therapy/Group: Individual Therapy  Roney Mans [facility] 07/16/2013, 1:36 PM

## 2013-07-16 NOTE — Progress Notes (Signed)
Physical Therapy Note  Patient Details  Name: Paul Reeves MRN: [id number] Date of Birth: 01-09-1986 Today's Date: 07/16/2013  Time: 730-830 60 minutes  1:1 No c/o pain.  Pt able to don shoes and AFO without assist, able to verbalize AFO wear schedule and cleaning.  Gait in home and controlled environments with close supervision, cues to slow cadence at times.  Pt requires occasional min A to correct LOB with gait.  Pt continues with slight circumduction of L LE in swing phase. Gait stepping over obstacles to improve hip and knee flexion to prevent circumduction with some improvement, min A.  Kitchen, bedroom and bathroom mobility and problem solving in ADL apartment.  Discussed need to perform kitchen tasks from w/c level when home alone, pt agrees.  Standing step ups with L LE to improve strength, min A for balance.  Kinetron x 5 minutes for LE strength and endurance.  Pt with improved activity tolerance noted this morning, decreased rest breaks required.   Runette Scifres 07/16/2013, 8:27 AM

## 2013-07-16 NOTE — Progress Notes (Signed)
Occupational Therapy Session Note  Patient Details  Name: Paul Reeves MRN: [id number] Date of Birth: 10/15/1985  Today's Date: 07/16/2013 Time: 1300-1315 Time Calculation (min): 15 min  Short Term Goals: Week 2:  OT Short Term Goal 1 (Week 2): Pt will require supervision for dynamic standing balance during clothing management. OT Short Term Goal 2 (Week 2): Pt will use LUE only for sit to stand transfers 100% of the time.  OT Short Term Goal 3 (Week 2): Pt will require no more than min verbal cues for dynamic standing tasks.   Skilled Therapeutic Interventions/Progress Updates: Therapeutic activity planned however patient presented with complaint of illness, stating, "I think I'm catching a cold."    Attempted remotivation to enourage participation in low demand activity seated or in bed.   Patient coughed several times and appeared lethargic.  Educated patient on need to contact and report symptoms to his Charity fundraiser.  Patient alerted RN as advised.   BP/HR checked; patient refused further treatment this session.    Therapy Documentation Precautions:  Precautions Precautions: Fall Precaution Comments: HIGH BLOOD PRESSURE!!! Restrictions Weight Bearing Restrictions: No  General: General Amount of Missed OT Time (min): 15 Minutes  Vital Signs: BP: 151/92 (supine, HOB elevated) HR: 80 BPM Temp: 99.2 O2 Sat: 96-98%  See FIM for current functional status  Therapy/Group: Individual Therapy  [name] 07/16/2013, 1:15 PM

## 2013-07-17 ENCOUNTER — Inpatient Hospital Stay (HOSPITAL_COMMUNITY): Payer: BC Managed Care – PPO | Admitting: Occupational Therapy

## 2013-07-17 ENCOUNTER — Inpatient Hospital Stay (HOSPITAL_COMMUNITY): Payer: BC Managed Care – PPO

## 2013-07-17 ENCOUNTER — Inpatient Hospital Stay (HOSPITAL_COMMUNITY): Payer: BC Managed Care – PPO | Admitting: Physical Therapy

## 2013-07-17 LAB — BASIC METABOLIC PANEL
BUN: 20 mg/dL (ref 6–23)
CO2: 27 mEq/L (ref 19–32)
Calcium: 9.4 mg/dL (ref 8.4–10.5)
Chloride: 102 mEq/L (ref 96–112)
Creatinine, Ser: 1.51 mg/dL — ABNORMAL HIGH (ref 0.50–1.35)
GFR calc Af Amer: 71 mL/min — ABNORMAL LOW (ref >90)
Glucose, Bld: 95 mg/dL (ref 70–99)

## 2013-07-17 MED ORDER — HYDROCHLOROTHIAZIDE 25 MG PO TABS
25.0000 mg | ORAL_TABLET | Freq: Every day | ORAL | Status: DC
  Administered 2013-07-17 – 2013-07-19 (×3): 25 mg via ORAL
  Filled 2013-07-17 (×4): qty 1

## 2013-07-17 MED ORDER — LORATADINE 10 MG PO TABS
10.0000 mg | ORAL_TABLET | Freq: Every day | ORAL | Status: DC
  Administered 2013-07-17 – 2013-07-19 (×3): 10 mg via ORAL
  Filled 2013-07-17 (×5): qty 1

## 2013-07-17 MED ORDER — GUAIFENESIN-DM 100-10 MG/5ML PO SYRP
5.0000 mL | ORAL_SOLUTION | Freq: Three times a day (TID) | ORAL | Status: DC
  Administered 2013-07-17 – 2013-07-19 (×7): 10 mL via ORAL
  Filled 2013-07-17 (×11): qty 10

## 2013-07-17 MED ORDER — LABETALOL HCL 200 MG PO TABS
200.0000 mg | ORAL_TABLET | Freq: Two times a day (BID) | ORAL | Status: DC
  Administered 2013-07-17 – 2013-07-18 (×3): 200 mg via ORAL
  Filled 2013-07-17 (×5): qty 1

## 2013-07-17 NOTE — Progress Notes (Signed)
Social Work Patient ID: Paul Reeves, male   DOB: 1985-11-06, 27 y.o.   MRN: [id number]  Met yesterday with pt to review team conference. Agreeable with continued plans to d/c 10/24 with mod i goals.  No concerns.  Planning to start with [facility] therapies to maximize coverage.  Continue to follow.  Jinny Sweetland, LCSW

## 2013-07-17 NOTE — Progress Notes (Signed)
Physical Therapy Note  Patient Details  Name: Paul Reeves MRN: [id number] Date of Birth: 07-04-1986 Today's Date: 07/17/2013  Time: 730-825 55 minutes  1:1 No c/o pain, pt c/o fatigue.  Gait with [facility] with close supervision in home and controlled environments with supervision.  Standing NMR with focus on L stance control while moving R LE on pillow case, requires min A for balance.  Step ups to 4'' and 2'' step with L LE with min A for wt shifts, grasp and release task with L hand.  Gait while carrying object with L UE with min A for balance.  W/c mobility on carpet with obstacle negotiation to simulate home environment as pt will be w/c level when not supervised.  Pt able to perform w/c mobility at mod I level throughout home environment.   Lashawn Orrego 07/17/2013, 8:25 AM

## 2013-07-17 NOTE — Progress Notes (Signed)
Occupational Therapy Weekly Progress Note  Patient Details  Name: Paul Reeves MRN: [id number] Date of Birth: 09/02/1986  Today's Date: 07/17/2013  Patient has met 3 of 3 short term goals.  Pt is overall supervision level of assist with BADL's.  Pt has improved with static and dynamic sitting and standing balance, continues to require close supervision for balance when fatigued or experiencing BP fluctuations.  Continues to integrate LUE in functional tasks, requiring cueing for more normal patterns of movement. LUE overall Brunnstrum stage 5.  OT to do family education this week with pts mother and continue to address dynamic standing balance, functional ambulation with quad cane, and safety with functional transfers, and LUE NMR.    Patient continues to demonstrate the following deficits: muscle weakness, decreased cardiorespiratoy endurance and decreased coordination, hemiplegia, impaired balance and therefore will continue to benefit from skilled OT intervention to enhance overall performance with BADL.  Patient progressing toward long term goals..  Continue plan of care.  OT Short Term Goals Week 2:  OT Short Term Goal 1 (Week 2): Pt will require supervision for dynamic standing balance during clothing management. OT Short Term Goal 1 - Progress (Week 2): Met OT Short Term Goal 2 (Week 2): Pt will use LUE only for sit to stand transfers 100% of the time.  OT Short Term Goal 2 - Progress (Week 2): Met OT Short Term Goal 3 (Week 2): Pt will require no more than min verbal cues for dynamic standing tasks.  OT Short Term Goal 3 - Progress (Week 2): Met   Skilled Therapeutic Interventions/Progress Updates:    First Session:  Individaul Time: 0900-1000 Time Calculation: 60 minutes  Pt engaged in 1:1 session focused on dynamic standing balance, functional ambulation/transfers.  Pt c/o of fatigue and congestion.  RN in with medications, see vitals below.  No complaints of pain. Pt  ambulated throughout ADL session with quad cane-supervision to min A assist.  Bathed with distant supervision, no LOB with dynamic standing or sitting.  LUE incorporated actively throughout bathing and dressing.  Set-up assist to don TED hose due to fatigue.  Pt complaint of nausea at conclusion of session.  Snack and drink offered, packages opened with LUE.  Transferred back to bed at conclusion of session.   Second Session:Individual Time: 1415-1500 Time Calculation: 45 minutes  Pt engaged in 1:1 session focused on NMR of LUE, activity tolerance, functional ambulation.  Pt ambulated from room to gym with quad cane (supervision assist).  In standing position, reached with LUE to remove sticky-notes placed at various heights on a vertical surface, then placed in designated location on tabletop.  From seated position, used LUE to grasp sticky notes from various planes and rolled into a ball to throw at desired target.  Ambulated back to room, no LOB with ambulation throughout session.  In bed resting at conclusion of session. No complaints of pain.   Therapy Documentation Precautions:  Precautions Precautions: Fall Precaution Comments: HIGH BLOOD PRESSURE!!! Restrictions Weight Bearing Restrictions: No Vital Signs: First Session:  Therapy Vitals Temp: 98.9 F (37.2 C) Temp src: Oral Pulse Rate: 82 Resp: 20 BP: 156/101 mmHg Patient Position, if appropriate: Standing Oxygen Therapy SpO2: 98 % O2 Device: None (Room air) Pain:  None.   See FIM for current functional status  Therapy/Group: Individual Therapy  Kandis Ban 07/17/2013, 9:08 AM

## 2013-07-17 NOTE — Patient Care Conference (Signed)
Inpatient RehabilitationTeam Conference and Plan of Care Update Date: 07/16/2013   Time: 3:00 PM    Patient Name: Paul Reeves      Medical Record Number: [id number]  Date of Birth: 23-Nov-1985 Sex: Male         Room/Bed: 4W21C/4W21C-01 Payor Info: Payor: [employer]UE [employer]UE [employer] / Plan: [employer] PPO OUT OF STATE / Product Type: *No Product type* /    Admitting Diagnosis: R BG SUBINSULAR ICH  Admit Date/Time:  07/01/2013  5:56 PM Admission Comments: No comment available   Primary Diagnosis:  Intracranial hemorrhage Principal Problem: Intracranial hemorrhage  Patient Active Problem List   Diagnosis Date Noted  . 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 Hypokalemia 07/01/2013  . Intracranial hemorrhage 07/01/2013    Expected Discharge Date: Expected Discharge Date: 07/19/13  Team Members Present: Physician leading conference: Dr. Faith Rogue Social Worker Present: Amada Jupiter, LCSW Nurse Present: Daryll Brod, RN PT Present: Zerita Boers, PT OT Present: Donzetta Kohut, OT;Jennifer Marlis Edelson, OT SLP Present: Feliberto Gottron, SLP PPS Coordinator present : Tora Duck, RN, CRRN     Current Status/Progress Goal Weekly Team Focus  Medical   bp better and more balanced. making functional progress  stabilize medically for dc  bp control, symptom mgt   Bowel/Bladder   Continent of bowel and bladder. LBM 07/13/13  Pt to remain continent of bowel and bladder  [place]   Swallow/Nutrition/ Hydration             ADL's   supervision   mod I  NMR left UE, functional ambulation with cane, dynamic standing , d/c planning   Mobility   supervision-min A  supervision, mod I w/c  family ed, balance, activity tolerance   Communication             Safety/Cognition/ Behavioral Observations            Pain   No c/o pain  <3  Continue to assess   Skin   CDI  No skin breakdown  Assess q shift    Rehab Goals Patient on target to meet rehab goals: Yes *See Care Plan and  progress notes for long and short-term goals.  Barriers to Discharge: safety, balance    Possible Resolutions to Barriers:  adaptive equipment, pacing    Discharge Planning/Teaching Needs:  home with family to try and provide 24/7 supervision - modified independent  family ed scheduled to take place 10/23 9-11   Team Discussion:  BP issues improving along with changes in therapy schedule allowing rest breaks.  Currently minimal assistance overall with goals for mod independent.  Continues with poor balance reactions. Very motivated and Chief Executive Officer.  Revisions to Treatment Plan:  None - last week, team had recommended extension or LOS with new d/c date 07/19/13   Continued Need for Acute Rehabilitation Level of Care: The patient requires daily medical management by a physician with specialized training in physical medicine and rehabilitation for the following conditions: Daily direction of a multidisciplinary physical rehabilitation program to ensure safe treatment while eliciting the highest outcome that is of practical value to the patient.: Yes Daily medical management of patient stability for increased activity during participation in an intensive rehabilitation regime.: Yes Daily analysis of laboratory values and/or radiology reports with any subsequent need for medication adjustment of medical intervention for : Neurological problems;Other  Joeanne Robicheaux 07/17/2013, 10:30 AM

## 2013-07-17 NOTE — Progress Notes (Signed)
Subjective/Complaints:  having cough, sore/scratchy throat. Doesn't feel too well. Low grade temp noted yesterday A 12 point review of systems has been performed and if not noted above is otherwise negative.   Objective: Vital Signs: Blood pressure 136/91, pulse 109, temperature 98.9 F (37.2 C), temperature source Oral, resp. rate 20, weight 96.163 kg (212 lb), SpO2 98.00%. No results found. No results found for this basename: WBC, HGB, HCT, PLT,  in the last 72 hours  Recent Labs  07/17/13 0529  NA 140  K 3.9  CL 102  GLUCOSE 95  BUN 20  CREATININE 1.51*  CALCIUM 9.4   CBG (last 3)  No results found for this basename: GLUCAP,  in the last 72 hours  Wt Readings from Last 3 Encounters:  07/03/13 96.163 kg (212 lb)  06/26/13 101.9 kg (224 lb 10.4 oz)    Physical Exam:  Nursing note and vitals reviewed.  Constitutional: He is oriented to person, place, and time. He appears well-developed and well-nourished.  HENT:  Head: Normocephalic and atraumatic.  Eyes: Conjunctivae are normal. Pupils are equal, round, and reactive to light.  Neck: Normal range of motion. Neck supple.  Cardiovascular: Normal rate and regular rhythm. No murmur  Pulmonary/Chest: Effort normal and breath sounds normal. No respiratory distress. He has no wheezes.  Abdominal: Soft. Bowel sounds are normal. He exhibits no distension. There is tenderness.  Musculoskeletal: He exhibits no edema and no tenderness.  Neurological: He is alert and oriented to person, place, and time.  Speech clear. Left facial weakness persists but better. Left hemiparesis with sensory deficits. LUE is 3 deltoid, 3/5 bic, tric, HI. LLE is 3/5 HF, KE; ADF tr, APF 2-. Sensation 1/2 left arm and leg. Mild left facial weakness, visual fields intact.  Skin: Skin is warm and dry.  Psychiatric: He has a normal mood and affect. His behavior is normal. Judgment and thought content normal.    Assessment/Plan: 1. Functional deficits  secondary to right basal ganglia/sub insular IPH which require 3+ hours per day of interdisciplinary therapy in a comprehensive inpatient rehab setting. Physiatrist is providing close team supervision and 24 hour management of active medical problems listed below. Physiatrist and rehab team continue to assess barriers to discharge/monitor patient progress toward functional and medical goals.      FIM: FIM - Bathing Bathing Steps Patient Completed: Chest;Abdomen;Front perineal area;Left upper leg;Buttocks;Left Arm;Right lower leg (including foot);Right upper leg;Left lower leg (including foot);Right Arm Bathing: 4: Steadying assist  FIM - Upper Body Dressing/Undressing Upper body dressing/undressing steps patient completed: Thread/unthread right sleeve of pullover shirt/dresss;Thread/unthread left sleeve of pullover shirt/dress;Put head through opening of pull over shirt/dress;Pull shirt over trunk Upper body dressing/undressing: 5: Supervision: Safety issues/verbal cues FIM - Lower Body Dressing/Undressing Lower body dressing/undressing steps patient completed: Thread/unthread right underwear leg;Thread/unthread left underwear leg;Pull underwear up/down;Thread/unthread right pants leg;Thread/unthread left pants leg;Pull pants up/down;Don/Doff right sock;Don/Doff right shoe;Fasten/unfasten right shoe;Don/Doff left sock;Don/Doff left shoe;Fasten/unfasten left shoe Lower body dressing/undressing: 5: Supervision: Safety issues/verbal cues  FIM - Toileting Toileting steps completed by patient: Adjust clothing prior to toileting;Performs perineal hygiene;Adjust clothing after toileting Toileting Assistive Devices: Grab bar or rail for support Toileting: 5: Supervision: Safety issues/verbal cues  FIM - Diplomatic Services operational officer Devices: Grab bars;Cane Toilet Transfers: 5-To toilet/BSC: Supervision (verbal cues/safety issues);5-From toilet/BSC: Supervision (verbal cues/safety  issues)  FIM - Press photographer Assistive Devices: Cane;Orthosis Bed/Chair Transfer: 5: Chair or W/C > Bed: Supervision (verbal cues/safety issues);5: Bed > Chair or W/C:  Supervision (verbal cues/safety issues)  FIM - Locomotion: Wheelchair Distance: 150 Locomotion: Wheelchair: 6: Travels 150 ft or more, turns around, maneuvers to table, bed or toilet, negotiates 3% grade: maneuvers on rugs and over door sills independently FIM - Locomotion: Ambulation Locomotion: Ambulation Assistive Devices: Cane - Quad;Orthosis Ambulation/Gait Assistance: 4: Min guard Locomotion: Ambulation: 4: Travels 150 ft or more with minimal assistance (Pt.>75%)  Comprehension Comprehension Mode: Auditory Comprehension: 7-Follows complex conversation/direction: With no assist  Expression Expression Mode: Verbal Expression: 7-Expresses complex ideas: With no assist  Social Interaction Social Interaction: 7-Interacts appropriately with others - No medications needed.  Problem Solving Problem Solving: 7-Solves complex problems: Recognizes & self-corrects  Memory Memory: 7-Complete Independence: No helper  Medical Problem List and Plan:  1. DVT Prophylaxis/Anticoagulation: Mechanical: Antiembolism stockings, knee (TED hose) Bilateral lower extremities  Sequential compression devices, below knee Bilateral lower extremities  2. Pain Management:  prn vicodin for headaches. Improved. 3. Mood: Will have LCSW follow for evaluation. Poor insight with impaired awareness at this time. Will monitor.  4. Neuropsych: This patient is capable of making decisions on his own behalf.  5. Malignant HTN: bp normotensive for the most part.   -continue labetalol bid but decrease to 200mg  norvasc hs, lotensin bid, resume hctz  -clonidine stopped  -adjust further as needed.  6. URI--schedule robitussin, add claritin, continue sx management  LOS (Days) 16 A FACE TO FACE EVALUATION WAS  PERFORMED  Paul Reeves T 07/17/2013 7:47 AM

## 2013-07-17 NOTE — Progress Notes (Signed)
Occupational Therapy Session Note  Patient Details  Name: Paul Reeves MRN: [id number] Date of Birth: 18-May-1986  Today's Date: 07/17/2013 Time: 1335-1400 Time Calculation (min): 25 min  Short Term Goals: Week 2:  OT Short Term Goal 1 (Week 2): Pt will require supervision for dynamic standing balance during clothing management. OT Short Term Goal 1 - Progress (Week 2): Met OT Short Term Goal 2 (Week 2): Pt will use LUE only for sit to stand transfers 100% of the time.  OT Short Term Goal 2 - Progress (Week 2): Met OT Short Term Goal 3 (Week 2): Pt will require no more than min verbal cues for dynamic standing tasks.  OT Short Term Goal 3 - Progress (Week 2): Met  Skilled Therapeutic Interventions/Progress Updates: Neuromuscular re-ed with emphasis on normalized movement pattern for left upper extremity during simulated functional activity.   Patient completed ROM arc activity, crossing midline and advance rings for 2 sessions (10 rings) with verbal cues and facilitation to reduce compensatory elevation of left scapula during shoulder flexion and horizontal adduction.   Patient required verbal cues to use key pinch to improve grasp and release of rings.       Therapy Documentation Precautions:  Precautions Precautions: Fall Precaution Comments: HIGH BLOOD PRESSURE!!! Restrictions Weight Bearing Restrictions: No  Vital Signs: Therapy Vitals Pulse Rate: 75 BP: 157/92 mmHg Patient Position, if appropriate: Sitting  Pain: No pain   See FIM for current functional status  Therapy/Group: Individual Therapy  [name] 07/17/2013, 2:11 PM

## 2013-07-17 NOTE — Progress Notes (Signed)
This note has been reviewed and this clinician agrees with information provided.  

## 2013-07-18 ENCOUNTER — Encounter (HOSPITAL_COMMUNITY): Payer: BC Managed Care – PPO | Admitting: Occupational Therapy

## 2013-07-18 ENCOUNTER — Inpatient Hospital Stay (HOSPITAL_COMMUNITY): Payer: BC Managed Care – PPO

## 2013-07-18 ENCOUNTER — Inpatient Hospital Stay (HOSPITAL_COMMUNITY): Payer: BC Managed Care – PPO | Admitting: Occupational Therapy

## 2013-07-18 ENCOUNTER — Inpatient Hospital Stay (HOSPITAL_COMMUNITY): Payer: BC Managed Care – PPO | Admitting: Physical Therapy

## 2013-07-18 MED ORDER — LABETALOL HCL 300 MG PO TABS
300.0000 mg | ORAL_TABLET | Freq: Two times a day (BID) | ORAL | Status: DC
  Administered 2013-07-18 – 2013-07-19 (×2): 300 mg via ORAL
  Filled 2013-07-18 (×4): qty 1

## 2013-07-18 NOTE — Progress Notes (Signed)
Physical Therapy Note  Patient Details  Name: Paul Reeves MRN: [id number] Date of Birth: 03-28-1986 Today's Date: 07/18/2013  Time: 1000-1054 54 minutes  1:1 No c/o pain.  Treatment focused on pt/family education with pt's mother.  Pt/mother educated on and demonstrated at supervision level gait with [facility], stair negotiation with cane and with railing, step ups to curb to simulate home entry, HEP for LE strengthening, car transfer, basic transfers.  Pt/mother educated on w/c set up and parts management, AFO use, energy conservation techniques and fall recovery at home.  Both express understanding.  Pt and mother agree on PT recommendation for supervision with gait and HHPT at this time.  Both report they feel safe for d/c home tomorrow at supervision level of care.   Paul Reeves 07/18/2013, 10:54 AM

## 2013-07-18 NOTE — Progress Notes (Signed)
This note has been reviewed and this clinician agrees with information provided.  

## 2013-07-18 NOTE — Progress Notes (Signed)
Physical Therapy Session Note  Patient Details  Name: Paul Reeves MRN: [id number] Date of Birth: 10-29-1985  Today's Date: 07/18/2013 Time: 4098-1191 Time Calculation (min): 22 min  Skilled Therapeutic Interventions/Progress Updates:  1:1. Pt received supine on tx mat working w/ OT. Pt able to t/f sup>sit and SPT tx mat>w/c mod(I). Focus this session on w/c propulsion and amb on hard level and carpeted surfaces. Pt able to propel w/c 200' w/ R UE/B LE x200', mod(I). Pt also able to amb 175' w/ [facility] and close (S). Intermittent catching of L LE when amb on carpeted surface, but no LOB. Pt sitting in w/c at end of session w/ all needs in reach.   See FIM for current functional status  Therapy/Group: Individual Therapy  Denzil Hughes 07/18/2013, 4:29 PM

## 2013-07-18 NOTE — Progress Notes (Signed)
Physical Therapy Discharge Summary  Patient Details  Name: Paul Reeves MRN: [id number] Date of Birth: 27-May-1986  Today's Date: 07/18/2013  Patient has met 8 of 8 long term goals due to improved activity tolerance, improved balance, improved postural control, increased strength, ability to compensate for deficits, functional use of  left upper extremity and left lower extremity and improved coordination.  Patient to discharge at an ambulatory level Supervision.   Pt is mod I at w/c level. Patient's care partner is independent to provide the necessary supervision assistance at discharge.  Reasons goals not met: n/a  Recommendation:  Patient will benefit from ongoing skilled PT services in home health setting to continue to advance safe functional mobility, address ongoing impairments in balance, strength, gait, coordination, and minimize fall risk.  Equipment: LBQC, w/c  Reasons for discharge: treatment goals met and discharge from hospital  Patient/family agrees with progress made and goals achieved: Yes  PT Discharge Cognition Overall Cognitive Status: Within Functional Limits for tasks assessed Sensation Sensation Light Touch: Appears Intact Proprioception Impaired Details: Impaired LUE;Impaired LLE Additional Comments: improved Coordination Gross Motor Movements are Fluid and Coordinated: No Fine Motor Movements are Fluid and Coordinated: No Motor  Motor Motor: Hemiplegia   Trunk/Postural Assessment  Cervical Assessment Cervical Assessment: Within Functional Limits Thoracic Assessment Thoracic Assessment: Within Functional Limits Lumbar Assessment Lumbar Assessment: Within Functional Limits Postural Control Righting Reactions: delayed  Balance Static Sitting Balance Static Sitting - Level of Assistance: 7: Independent Dynamic Sitting Balance Dynamic Sitting - Level of Assistance: 7: Independent Static Standing Balance Static Standing - Balance Support:  During functional activity Static Standing - Level of Assistance: 5: Stand by assistance Dynamic Standing Balance Dynamic Standing - Balance Support: During functional activity Dynamic Standing - Level of Assistance: 5: Stand by assistance Extremity Assessment      RLE Assessment RLE Assessment: Within Functional Limits LLE Assessment LLE Assessment:  (ankle 2/5, knee ext 3-/5, hip flex 3/5, knee flex 3-/5)  See FIM for current functional status  Paul Reeves 07/18/2013, 2:15 PM

## 2013-07-18 NOTE — Discharge Summary (Signed)
Physician Discharge Summary  Patient ID: Paul Reeves MRN: [id number] DOB/AGE: 1985/12/29 27 y.o.  Admit date: 07/01/2013 Discharge date: 07/19/2013  Discharge Diagnoses:  Principal Problem:   Intracranial hemorrhage Active Problems:   Malignant hypertension   Discharged Condition: Stable  Significant Diagnostic Studies: N/A  Labs:  Basic Metabolic Panel:  Recent Labs Lab 07/17/13 0529 07/19/13 0520  NA 140 140  K 3.9 3.6  CL 102 100  CO2 27 28  GLUCOSE 95 98  BUN 20 17  CREATININE 1.51* 1.61*  CALCIUM 9.4 9.7    CBC: No results found for this basename: WBC, NEUTROABS, HGB, HCT, MCV, PLT,  in the last 168 hours  CBG: No results found for this basename: GLUCAP,  in the last 168 hours  Brief HPI:   Paul Reeves is a 27 y.o. male with history of HTN who was found on the bathroom floor by family with left sided weakness, difficulty talking and inability to move on 06/26/13. Blood pressure SBP-250 per EMS reports. 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 UDS negative. Treated with IV labetalol and started on cardene drip for BP control. Swallow evaluation without dysphagia and regular diet recommended. Blood pressures remain poorly controlled with headaches being a limiting factor. Renal artery duplex negative for stenosis. 24 hours urine ordered to rule out pheochromocytoma. Gait not attempted due to elevated BP as well as complaints of dizziness . Therapy team recommended CIR and he was admitted on 07/01/13.   Hospital Course: Paul Reeves was admitted to rehab 07/01/2013 for inpatient therapies to consist of PT, ST and OT at least three hours five days a week. Past admission physiatrist, therapy team and rehab RN have worked together to provide customized collaborative inpatient rehab. Blood pressures were monitored every four hours initially as they were significantly elevated and hard to control. Medications were  titrated slowly to avoid side effects and lytes were monitored for stability in renal status and K+ level. At discharge BP control has improved and he is to follow up with primary care for further titration of medications. His creatinine is on upward trend and repeat BMET to be checked by [facility] in next five days. 24 hour urine was negative. Headaches have resolved. He did have URI symptoms that was treated symptomatically. He is advised to continue to push fluids and use anti tussives till symptoms resolve. He has had improvement in left sided weakness and currently Left  deltoid, biceps, triceps and hand intrinsics are at 3/5. LLE is 3/5 at hip flexors and Knee extensors; ADF trace, APF 2.  He was fitted with Left AFO to help with gait quality and foot drop.   Rehab course: During patient's stay in rehab weekly team conferences were held to monitor patient's progress, set goals and discuss barriers to discharge. Patient has had improvement in activity tolerance, balance, postural control, as well as ability to compensate for deficits.  He is has had improvement in functional use LUE  and LLE. He is modified independent for transfers and wheelchair navigation of 200'. He is able to ambulate 29' with [facility] and close supervision with intermittent catching of left foot but no LOB. He requires supervision for stair navigation.  He is modified independent to complete bathing and dressing tasks. Family eduation was done with his mother who will provide assistance as needed past discharge.    Disposition: 01-Home or Self Care  Diet: Low fat low cholesterol.   Special Instructions:  1. [facility] for PT,OT and RN. HHRN to draw BMET next wed with results to Dr. Riley Kill.        Future Appointments Paul Reeves Department Dept Phone   08/02/2013 10:40 AM Ranelle Oyster, MD  Physical Medicine and Rehabilitation [phone]       Medication List         acetaminophen 325 MG tablet  Commonly  known as:  TYLENOL  Take 1-2 tablets (325-650 mg total) by mouth every 4 (four) hours as needed.     amLODipine 10 MG tablet  Commonly known as:  NORVASC  Take 1 tablet (10 mg total) by mouth at bedtime.     benazepril 10 MG tablet  Commonly known as:  LOTENSIN  Take 1 tablet (10 mg total) by mouth 2 (two) times daily.     hydrochlorothiazide 25 MG tablet  Commonly known as:  HYDRODIURIL  Take 1 tablet (25 mg total) by mouth daily.     labetalol 300 MG tablet  Commonly known as:  NORMODYNE  Take 1 tablet (300 mg total) by mouth 2 (two) times daily.     loratadine 10 MG tablet  Commonly known as:  CLARITIN  Take 1 tablet (10 mg total) by mouth daily. Till cold symptoms resolve     senna-docusate 8.6-50 MG per tablet  Commonly known as:  Senokot-S  Take 1 tablet by mouth 2 (two) times daily. For constipation       Follow-up Information   Follow up with Ranelle Oyster, MD On 08/02/2013. (be there at 10:15 for 10:40  am appointment)    Specialty:  Physical Medicine and Rehabilitation   Contact information:   [address], [address] [place] [address] [phone]       Follow up with Gates Rigg, MD. Call today. (for follow up in 4-6 weeks. )    Specialties:  Neurology, Radiology   Contact information:   [address] [place] [address] [phone]       Follow up with Sissy Hoff, MD On 08/08/2013. (@ 3:00 pm)    Specialty:  Family Medicine   Contact information:   [address] [place] [address] [phone]       Signed: Jacquelynn Cree 07/19/2013, 4:49 PM

## 2013-07-18 NOTE — Progress Notes (Signed)
Occupational Therapy Session Notes  Patient Details  Name: Paul Reeves MRN: [id number] Date of Birth: [birth year]  Today's Date: 07/18/2013    Short Term Goals: Week 3: STGs=LTGs due to LOS  Skilled Therapeutic Interventions/Progress Updates:  First session: Individual Time:  0900- 1000 Time Calculation: 60 minutes    Pt and mom present for family education session.  Pt's mother educated on level of assist to provide pt during bathing, toileting, and dressing tasks, educated on grab bar for shower and hand-held shower options for safety.  Pt verbalized that he could complete tasks himself when Mom attempted to provide physical assist.  Pt completed bathing from walk-in shower in room, ambulated to ADL apartment to demonstrate tub/shower transfer and option for lateral leans to avoid standing in shower if fatigued.   Mom verbalized understanding of information presented and feels comfortable as caregiver as she has provided care to multiple family members over the past several years.   Second Session:Individual Time: 1415-1500 Time Calculation:  45 minutes  Pt asleep upon entry to room.  Slow to rise and sit EOB.  Pt stated he had not eaten lunch yet.  Sat EOB to open all containers with LUE and eat meal by scooping food and bringing to mouth with LUE only.  Upon standing to ambulate to gym, pt c/o of dizziness.  Orthostatic BP taken by nurse tech at that time, WNL.   Self-propelled w/c to gym.  Pt transferred to supine position on mat due to fatigue. Engaged in demonstrating AROM of all joints LUE, reassessed LUE for tone and sensation.  PT present for next therapy session. No complaints of pain.   Therapy Documentation Precautions:  Precautions Precautions: Fall Precaution Comments: HIGH BLOOD PRESSURE!!! Restrictions Weight Bearing Restrictions: No  Pain: Pain Assessment Pain Assessment: No/denies pain  See FIM for current functional status  Therapy/Group: Individual  Therapy  Kandis Ban 07/18/2013, 9:55 AM

## 2013-07-18 NOTE — Progress Notes (Signed)
Subjective/Complaints:  feels better today. Asks if he needs to wear TEDS at home.  A 12 point review of systems has been performed and if not noted above is otherwise negative.   Objective: Vital Signs: Blood pressure 146/94, pulse 87, temperature 98.9 F (37.2 C), temperature source Oral, resp. rate 18, weight 94.711 kg (208 lb 12.8 oz), SpO2 97.00%. No results found. No results found for this basename: WBC, HGB, HCT, PLT,  in the last 72 hours  Recent Labs  07/17/13 0529  NA 140  K 3.9  CL 102  GLUCOSE 95  BUN 20  CREATININE 1.51*  CALCIUM 9.4   CBG (last 3)  No results found for this basename: GLUCAP,  in the last 72 hours  Wt Readings from Last 3 Encounters:  07/17/13 94.711 kg (208 lb 12.8 oz)  06/26/13 101.9 kg (224 lb 10.4 oz)    Physical Exam:  Nursing note and vitals reviewed.  Constitutional: He is oriented to person, place, and time. He appears well-developed and well-nourished.  HENT:  Head: Normocephalic and atraumatic.  Eyes: Conjunctivae are normal. Pupils are equal, round, and reactive to light.  Neck: Normal range of motion. Neck supple.  Cardiovascular: Normal rate and regular rhythm. No murmur  Pulmonary/Chest: Effort normal and breath sounds normal. No respiratory distress. He has no wheezes.  Abdominal: Soft. Bowel sounds are normal. He exhibits no distension. There is tenderness.  Musculoskeletal: He exhibits no edema and no tenderness.  Neurological: He is alert and oriented to person, place, and time.  Speech clear. Left facial weakness persists but better. Left hemiparesis with sensory deficits. LUE is 3 deltoid, 3/5 bic, tric, HI. LLE is 3/5 HF, KE; ADF tr, APF 2-. Sensation 1/2 left arm and leg. Mild left facial weakness, visual fields intact.  Skin: Skin is warm and dry.  Psychiatric: He has a normal mood and affect. His behavior is normal. Judgment and thought content normal.    Assessment/Plan: 1. 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 Physiatrist is providing close team supervision and 24 hour management of active medical problems listed below. Physiatrist and rehab team continue to assess barriers to discharge/monitor patient progress toward functional and medical goals.      FIM: FIM - Bathing Bathing Steps Patient Completed: Chest;Abdomen;Front perineal area;Left upper leg;Buttocks;Left Arm;Right lower leg (including foot);Right upper leg;Left lower leg (including foot);Right Arm Bathing: 5: Supervision: Safety issues/verbal cues  FIM - Upper Body Dressing/Undressing Upper body dressing/undressing steps patient completed: Thread/unthread right sleeve of pullover shirt/dresss;Thread/unthread left sleeve of pullover shirt/dress;Put head through opening of pull over shirt/dress;Pull shirt over trunk Upper body dressing/undressing: 5: Supervision: Safety issues/verbal cues FIM - Lower Body Dressing/Undressing Lower body dressing/undressing steps patient completed: Thread/unthread right underwear leg;Thread/unthread left underwear leg;Pull underwear up/down;Thread/unthread right pants leg;Thread/unthread left pants leg;Pull pants up/down;Don/Doff right sock;Don/Doff right shoe;Fasten/unfasten right shoe;Don/Doff left sock;Don/Doff left shoe;Fasten/unfasten left shoe Lower body dressing/undressing: 5: Supervision: Safety issues/verbal cues  FIM - Toileting Toileting steps completed by patient: Adjust clothing prior to toileting;Performs perineal hygiene;Adjust clothing after toileting Toileting Assistive Devices: Grab bar or rail for support Toileting: 6: Assistive device: No helper  FIM - Diplomatic Services operational officer Devices: Grab bars;Cane Toilet Transfers: 5-To toilet/BSC: Supervision (verbal cues/safety issues);5-From toilet/BSC: Supervision (verbal cues/safety  issues)  FIM - Bed/Chair Transfer Bed/Chair Transfer Assistive Devices: Cane;Orthosis Bed/Chair Transfer: 5: Chair or W/C > Bed: Supervision (verbal cues/safety  issues);5: Bed > Chair or W/C: Supervision (verbal cues/safety issues)  FIM - Locomotion: Wheelchair Distance: 150 Locomotion: Wheelchair: 6: Travels 150 ft or more, turns around, maneuvers to table, bed or toilet, negotiates 3% grade: maneuvers on rugs and over door sills independently FIM - Locomotion: Ambulation Locomotion: Ambulation Assistive Devices: Cane - Quad;Orthosis Ambulation/Gait Assistance: 4: Min guard Locomotion: Ambulation: 5: Travels 150 ft or more with supervision/safety issues  Comprehension Comprehension Mode: Auditory Comprehension: 7-Follows complex conversation/direction: With no assist  Expression Expression Mode: Verbal Expression: 7-Expresses complex ideas: With no assist  Social Interaction Social Interaction: 7-Interacts appropriately with others - No medications needed.  Problem Solving Problem Solving: 7-Solves complex problems: Recognizes & self-corrects  Memory Memory: 7-Complete Independence: No helper  Medical Problem List and Plan:  1. DVT Prophylaxis/Anticoagulation: Mechanical: Antiembolism stockings, knee (TED hose) Bilateral lower extremities  Sequential compression devices, below knee Bilateral lower extremities  2. Pain Management:  prn vicodin for headaches. Improved. 3. Mood: Will have LCSW follow for evaluation. Poor insight with impaired awareness at this time. Will monitor.  4. Neuropsych: This patient is capable of making decisions on his own behalf.  5. Malignant HTN: bp normotensive for the most part.   -decreased labetalol to 200mg  bid but bp increased---resume 300mg  dose for now.  -continue norvasc hs, lotensin bid, hctz  -continue off clonidine  -adjust further as needed.  6. URI--scheduled robitussin  claritin, continue sx management  -feeling better today  LOS  (Days) 17 A FACE TO FACE EVALUATION WAS PERFORMED  Chardonnay Holzmann T 07/18/2013 8:09 AM

## 2013-07-18 NOTE — Progress Notes (Signed)
Occupational Therapy Discharge Summary  Patient Details  Name: Paul Reeves MRN: [id number] Date of Birth: [birth year]  Today's Date: 07/18/2013    Patient has met 9 of 9 long term goals due to improved activity tolerance, improved balance, postural control, functional use of  RIGHT upper extremity and improved coordination.  Pt has been motivated to participate in therapies, continues to experience minimal fluctuation in level of assist needed for balance during tasks due to fatigue and/or issues with BP.  Pt demonstrates appropriate awareness of impairments and is able to make safe decisions regarding ADL tasks/transfers.  Patient to discharge at overall Modified Independent level (at w/c level).  Patient's care partner is independent to provide the necessary physical assistance at discharge.     Recommendation:  Patient will benefit from ongoing skilled OT services in home health setting to continue to advance functional skills in the area of BADL and iADL.  Equipment: tub bench, BSC  Reasons for discharge: treatment goals met  Patient/family agrees with progress made and goals achieved: Yes  OT Discharge Precautions/Restrictions    General   Vital Signs Therapy Vitals Temp: 98.2 F (36.8 C) Temp src: Oral Resp: 18 BP: 151/93 mmHg Patient Position, if appropriate: Lying Oxygen Therapy SpO2: 97 % Pain  no c/o  ADL  see FIM Vision/Perception  Vision - History Baseline Vision: No visual deficits Patient Visual Report: No change from baseline Vision - Assessment Eye Alignment: Within Functional Limits Perception Perception: Within Functional Limits Praxis Praxis: Intact  Cognition Overall Cognitive Status: Within Functional Limits for tasks assessed Arousal/Alertness: Awake/alert Orientation Level: Oriented X4 Attention: Selective Selective Attention: Appears intact Memory: Appears intact Awareness: Appears intact Problem Solving: Appears  intact Safety/Judgment: Appears intact Sensation Sensation Light Touch: Impaired Detail (decreased but present on LUE overall) Stereognosis: Not tested Hot/Cold: Appears Intact Proprioception Impaired Details: Impaired LUE;Impaired LLE Additional Comments: improved Coordination Gross Motor Movements are Fluid and Coordinated: No Fine Motor Movements are Fluid and Coordinated: No Motor  Motor Motor: Hemiplegia Mobility  Transfers Transfers: Sit to Stand;Stand to Sit Sit to Stand: 6: Modified independent (Device/Increase time)  Trunk/Postural Assessment  Cervical Assessment Cervical Assessment: Within Functional Limits Thoracic Assessment Thoracic Assessment: Within Functional Limits Lumbar Assessment Lumbar Assessment: Within Functional Limits Postural Control Righting Reactions: delayed  Balance Balance Balance Assessed: Yes Static Sitting Balance Static Sitting - Balance Support: No upper extremity supported;Feet supported Static Sitting - Level of Assistance: 7: Independent Dynamic Sitting Balance Dynamic Sitting - Balance Support: No upper extremity supported;During functional activity Dynamic Sitting - Level of Assistance: 7: Independent Static Standing Balance Static Standing - Balance Support: During functional activity Static Standing - Level of Assistance: 6: Modified independent (Device/Increase time) Dynamic Standing Balance Dynamic Standing - Balance Support: During functional activity Dynamic Standing - Level of Assistance: 6: Modified independent (Device/Increase time) Extremity/Trunk Assessment RUE Assessment RUE Assessment: Within Functional Limits LUE Assessment LUE Assessment: Exceptions to WFL LUE AROM (degrees) Overall AROM 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

## 2013-07-19 LAB — BASIC METABOLIC PANEL
CO2: 28 mEq/L (ref 19–32)
Calcium: 9.7 mg/dL (ref 8.4–10.5)
Creatinine, Ser: 1.61 mg/dL — ABNORMAL HIGH (ref 0.50–1.35)
GFR calc non Af Amer: 57 mL/min — ABNORMAL LOW (ref >90)
Glucose, Bld: 98 mg/dL (ref 70–99)
Sodium: 140 mEq/L (ref 135–145)

## 2013-07-19 MED ORDER — AMLODIPINE BESYLATE 10 MG PO TABS: 10.0000 mg | ORAL_TABLET | Freq: Every day | ORAL | Status: AC

## 2013-07-19 MED ORDER — HYDROCHLOROTHIAZIDE 25 MG PO TABS: 25.0000 mg | ORAL_TABLET | Freq: Every day | ORAL | Status: AC

## 2013-07-19 MED ORDER — LORATADINE 10 MG PO TABS: 10.0000 mg | ORAL_TABLET | Freq: Every day | ORAL | Status: DC

## 2013-07-19 MED ORDER — LABETALOL HCL 300 MG PO TABS: 300.0000 mg | ORAL_TABLET | Freq: Two times a day (BID) | ORAL | Status: AC

## 2013-07-19 MED ORDER — ACETAMINOPHEN 325 MG PO TABS: 325.0000 mg | ORAL_TABLET | ORAL | Status: DC | PRN

## 2013-07-19 MED ORDER — SENNOSIDES-DOCUSATE SODIUM 8.6-50 MG PO TABS: 1.0000 | ORAL_TABLET | Freq: Two times a day (BID) | ORAL | Status: DC

## 2013-07-19 MED ORDER — BENAZEPRIL HCL 10 MG PO TABS: 10.0000 mg | ORAL_TABLET | Freq: Two times a day (BID) | ORAL | Status: DC

## 2013-07-19 NOTE — Progress Notes (Signed)
Social Work  Discharge Note  The overall goal for the admission was met for:   Discharge location: Yes - home with intermittent assistance from mother and sister  Length of Stay: Yes - 18 days  Discharge activity level: Yes - modified independent to supervision overall  Home/community participation: Yes  Services provided included: MD, RD, PT, OT, SLP, RN, Pharmacy, Neuropsych and SW  Financial Services: Private Insurance: Economist  Follow-up services arranged: Home Health: Charity fundraiser, PT, OT via [facility], DME: 18x18 Breezy w/c, cushion, LBQC, 3n1 commode and tub bence via [facility] and Patient/Family has no preference for HH/DME agencies  Comments (or additional information):  Patient/Family verbalized understanding of follow-up arrangements: Yes  Individual responsible for coordination of the follow-up plan: patient  Confirmed correct DME delivered: Xochitl Egle 07/19/2013    Adger Cantera

## 2013-07-19 NOTE — Progress Notes (Signed)
Patient dc'd ho home with mom at approximately 1345. Delle Reining, PA gave and reviewed discharged instructions with patient. Patient verbalized understanding with discharge instructions. Clista Bernhardt A

## 2013-07-19 NOTE — Progress Notes (Signed)
This note has been reviewed and this clinician agrees with information provided.  

## 2013-07-19 NOTE — Progress Notes (Signed)
Subjective/Complaints: Up sitting eob. No complaints. Ready for dc. Pain controlled. Cold sx better  A 12 point review of systems has been performed and if not noted above is otherwise negative.   Objective: Vital Signs: Blood pressure 125/78, pulse 70, temperature 98.1 F (36.7 C), temperature source Oral, resp. rate 18, weight 94.711 kg (208 lb 12.8 oz), SpO2 97.00%. No results found. No results found for this basename: WBC, HGB, HCT, PLT,  in the last 72 hours  Recent Labs  07/17/13 0529 07/19/13 0520  NA 140 140  K 3.9 3.6  CL 102 100  GLUCOSE 95 98  BUN 20 17  CREATININE 1.51* 1.61*  CALCIUM 9.4 9.7   CBG (last 3)  No results found for this basename: GLUCAP,  in the last 72 hours  Wt Readings from Last 3 Encounters:  07/17/13 94.711 kg (208 lb 12.8 oz)  06/26/13 101.9 kg (224 lb 10.4 oz)    Physical Exam:  Nursing note and vitals reviewed.  Constitutional: He is oriented to person, place, and time. He appears well-developed and well-nourished.  HENT:  Head: Normocephalic and atraumatic.  Eyes: Conjunctivae are normal. Pupils are equal, round, and reactive to light.  Neck: Normal range of motion. Neck supple.  Cardiovascular: Normal rate and regular rhythm. No murmur  Pulmonary/Chest: Effort normal and breath sounds normal. No respiratory distress. He has no wheezes.  Abdominal: Soft. Bowel sounds are normal. He exhibits no distension. There is tenderness.  Musculoskeletal: He exhibits no edema and no tenderness.  Neurological: He is alert and oriented to person, place, and time.  Speech clear. Left facial weakness persists but better. Left hemiparesis with sensory deficits. LUE is 3 deltoid, 3/5 bic, tric, HI. LLE is 3/5 HF, KE; ADF tr, APF 2-. Sensation 1/2 left arm and leg. Mild left facial weakness, visual fields intact.  Skin: Skin is warm and dry.  Psychiatric: He has a normal mood and affect. His behavior is normal. Judgment and thought content normal.     Assessment/Plan: 1. Functional deficits secondary to right basal ganglia/sub insular IPH which require 3+ hours per day of interdisciplinary therapy in a comprehensive inpatient rehab setting. Physiatrist is providing close team supervision and 24 hour management of active medical problems listed below. Physiatrist and rehab team continue to assess barriers to discharge/monitor patient progress toward functional and medical goals.      FIM: FIM - Bathing Bathing Steps Patient Completed: Chest;Abdomen;Front perineal area;Left upper leg;Buttocks;Left Arm;Right lower leg (including foot);Right upper leg;Left lower leg (including foot);Right Arm Bathing: 5: Supervision: Safety issues/verbal cues  FIM - Upper Body Dressing/Undressing Upper body dressing/undressing steps patient completed: Thread/unthread right sleeve of pullover shirt/dresss;Thread/unthread left sleeve of pullover shirt/dress;Put head through opening of pull over shirt/dress;Pull shirt over trunk Upper body dressing/undressing: 5: Supervision: Safety issues/verbal cues FIM - Lower Body Dressing/Undressing Lower body dressing/undressing steps patient completed: Thread/unthread right underwear leg;Thread/unthread left underwear leg;Pull underwear up/down;Thread/unthread right pants leg;Thread/unthread left pants leg;Pull pants up/down;Don/Doff right sock;Don/Doff right shoe;Fasten/unfasten right shoe;Don/Doff left sock;Don/Doff left shoe;Fasten/unfasten left shoe Lower body dressing/undressing: 5: Supervision: Safety issues/verbal cues  FIM - Toileting Toileting steps completed by patient: Adjust clothing prior to toileting;Performs perineal hygiene;Adjust clothing after toileting Toileting Assistive Devices: Grab bar or rail for support Toileting: 4: Steadying assist  FIM - Diplomatic Services operational officer Devices: Grab bars;Cane Toilet Transfers: 5-To toilet/BSC: Supervision (verbal cues/safety issues);5-From  toilet/BSC: Supervision (verbal cues/safety issues)  FIM - Press photographer Assistive Devices: Cane;Orthosis Bed/Chair Transfer: 6: Bed >  Chair or W/C: No assist;6: Supine > Sit: No assist  FIM - Locomotion: Wheelchair Distance: 150 Locomotion: Wheelchair: 6: Travels 150 ft or more, turns around, maneuvers to table, bed or toilet, negotiates 3% grade: maneuvers on rugs and over door sills independently FIM - Locomotion: Ambulation Locomotion: Ambulation Assistive Devices: Cane - Quad;Orthosis Ambulation/Gait Assistance: 4: Min guard Locomotion: Ambulation: 5: Travels 150 ft or more with supervision/safety issues  Comprehension Comprehension Mode: Auditory Comprehension: 7-Follows complex conversation/direction: With no assist  Expression Expression Mode: Verbal Expression: 7-Expresses complex ideas: With no assist  Social Interaction Social Interaction: 7-Interacts appropriately with others - No medications needed.  Problem Solving Problem Solving: 7-Solves complex problems: Recognizes & self-corrects  Memory Memory: 7-Complete Independence: No helper  Medical Problem List and Plan:  1. DVT Prophylaxis/Anticoagulation: Mechanical: Antiembolism stockings, knee (TED hose) Bilateral lower extremities     2. Pain Management:  prn vicodin for headaches. Improved. 3. Mood: Will have LCSW follow for evaluation. Poor insight with impaired awareness at this time. Will monitor.  4. Neuropsych: This patient is capable of making decisions on his own behalf.  5. Malignant HTN: bp normotensive for the most part.   -decreased labetalol to 200mg  bid but bp increased---resumed 300mg  dose yesterday.  -continue norvasc hs, lotensin bid, hctz  -continue off clonidine  -adjust further as needed as outpt.  6. URI--scheduled robitussin  claritin, continue sx management  -resolved  LOS (Days) 18 A FACE TO FACE EVALUATION WAS PERFORMED  Nima Kemppainen T 07/19/2013 8:13 AM

## 2013-07-22 ENCOUNTER — Inpatient Hospital Stay: Payer: BC Managed Care – PPO | Admitting: Physical Medicine & Rehabilitation

## 2013-07-23 ENCOUNTER — Telehealth: Payer: Self-pay | Admitting: *Deleted

## 2013-07-23 NOTE — Telephone Encounter (Signed)
Ok to proceed. 

## 2013-07-23 NOTE — Telephone Encounter (Signed)
Wes notified.

## 2013-07-23 NOTE — Telephone Encounter (Signed)
Requesting VO for PT visit 3 x wk for 4 wk (3wk4) and then 2wk2

## 2013-08-02 ENCOUNTER — Encounter
Payer: BC Managed Care – PPO | Attending: Physical Medicine & Rehabilitation | Admitting: Physical Medicine & Rehabilitation

## 2013-08-02 ENCOUNTER — Encounter: Payer: Self-pay | Admitting: Physical Medicine & Rehabilitation

## 2013-08-02 ENCOUNTER — Encounter (INDEPENDENT_AMBULATORY_CARE_PROVIDER_SITE_OTHER): Payer: Self-pay

## 2013-08-02 VITALS — BP 136/90 | HR 70 | Resp 14 | Ht 72.0 in | Wt 214.8 lb

## 2013-08-02 DIAGNOSIS — I69959 Hemiplegia and hemiparesis following unspecified cerebrovascular disease affecting unspecified side: Secondary | ICD-10-CM | POA: Insufficient documentation

## 2013-08-02 DIAGNOSIS — G8194 Hemiplegia, unspecified affecting left nondominant side: Secondary | ICD-10-CM

## 2013-08-02 DIAGNOSIS — G8190 Hemiplegia, unspecified affecting unspecified side: Secondary | ICD-10-CM

## 2013-08-02 DIAGNOSIS — I629 Nontraumatic intracranial hemorrhage, unspecified: Secondary | ICD-10-CM

## 2013-08-02 DIAGNOSIS — I10 Essential (primary) hypertension: Secondary | ICD-10-CM | POA: Insufficient documentation

## 2013-08-02 MED ORDER — BENAZEPRIL HCL 20 MG PO TABS: 20.0000 mg | ORAL_TABLET | Freq: Two times a day (BID) | ORAL | Status: DC

## 2013-08-02 NOTE — Progress Notes (Signed)
Subjective:    Patient ID: Paul Reeves, male    DOB: [birth year], 27 y.o.   MRN: [id number]  HPI  Paul Reeves is back regarding his ICH and left hemiparesis. By all accounts things have been going fairly well. He denies any pain. He has not had any falls. He uses a quad cane for support. He is able to perform his self-care tasks on his own. HH therapies are coming out to the house. They are planning on 2 or 3 more weeks. He is working on a HEP as well. His left AFO is working well.   His BP has been better but his diastolic pressure has been routinely in the 90's.     Pain Inventory Average Pain 0 Pain Right Now 0 My pain is no pain  In the last 24 hours, has pain interfered with the following? General activity 0 Relation with others 0 Enjoyment of life 0 What TIME of day is your pain at its worst? no pain Sleep (in general) Good  Pain is worse with: no pain Pain improves with: no pain Relief from Meds: no pain  Mobility use a cane  Function disabled: date disabled .  Neuro/Psych No problems in this area  Prior Studies Any changes since last visit?  no  Physicians involved in your care Any changes since last visit?  no   Family History  Problem Relation Age of Onset  . Hypertension Father   . Diabetes Maternal Grandmother    History   Social History  . Marital Status: Single    Spouse Name: N/A    Number of Children: N/A  . Years of Education: N/A   Social History Main Topics  . Smoking status: Never Smoker   . Smokeless tobacco: Never Used  . Alcohol Use: 0.6 oz/week    1 Glasses of wine per week     Comment: glass of wine a a month or two  . Drug Use: No  . Sexual Activity: No   Other Topics Concern  . None   Social History Narrative  . None   History reviewed. No pertinent past surgical history. Past Medical History  Diagnosis Date  . Hypertension 06/26/2013    new dx  . Seasonal allergies     in summer and spring.   BP 136/90   Pulse 70  Resp 14  Ht 6' (1.829 m)  Wt 214 lb 12.8 oz (97.433 kg)  BMI 29.13 kg/m2  SpO2 97%   Review of Systems  Musculoskeletal: Positive for gait problem.  All other systems reviewed and are negative.       Objective:   Physical Exam  Constitutional: He is oriented to person, place, and time. He appears well-developed and well-nourished.  HENT:  Head: Normocephalic and atraumatic.  Eyes: Conjunctivae are normal. Pupils are equal, round, and reactive to light.  Neck: Normal range of motion. Neck supple.  Cardiovascular: Normal rate and regular rhythm. No murmur  Pulmonary/Chest: Effort normal and breath sounds normal. No respiratory distress. He has no wheezes.  Abdominal: Soft. Bowel sounds are normal. He exhibits no distension..  Musculoskeletal: He exhibits no edema and no tenderness.  Neurological: He is alert and oriented to person, place, and time.  Speech clear. Left facial weakness persists but better. Left hemiparesis with sensory deficits. LUE is 4+ deltoid, 4 to 4+/5 bic, tric, HI. LLE is 4+/5 HF, KE; ADF 1-2, APF 2-3. Sensation 1/2 left arm and leg but better. Mild left facial weakness, visual  fields intact. He walks with good technique and minimal substitution pattern. He did have some difficulty when he changed direction. CN was completely intact Skin: Skin is warm and dry.  Psychiatric: He has a normal mood and affect. His behavior is normal. Judgment and thought content normal. He is alert and appropriate  Assessment/Plan:  1. 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 Pain Management: prn vicodin for headaches. resolved 3. Driving--gave permission to drive after trials with family.  4. . Malignant HTN: bp normotensive for the most part.  -labetalol 300mg  bid  .  -continue norvasc hs, lotensin bid---increaseD to 20mg  TODAY -continue hctz  .-adjust further as needed per dr. Azucena Cecil 5. Follow up with me in about 2 months. 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

## 2013-08-02 NOTE — Patient Instructions (Signed)
YOU MAY BEGIN DRIVING AFTER A SUCCESSFUL TRIAL WITH YOUR FAMILY IN THE PARKING LOT AND 2 SUBSEQUENT SUCCESSFUL TRIALS IN DAYTIME TRAFFIC UNDER THE SUPERVISION OF A FAMILY MEMBER.     YOU MAY RESUME WORK AS TOLERATED AS OF 09/29/2013

## 2013-08-09 ENCOUNTER — Telehealth: Payer: Self-pay

## 2013-08-09 DIAGNOSIS — G8194 Hemiplegia, unspecified affecting left nondominant side: Secondary | ICD-10-CM

## 2013-08-09 DIAGNOSIS — I629 Nontraumatic intracranial hemorrhage, unspecified: Secondary | ICD-10-CM

## 2013-08-09 NOTE — Telephone Encounter (Signed)
i think i was going to make a referral to neuro rehab here in [place]. Please check with the patient to see if that's ok with him.

## 2013-08-09 NOTE — Telephone Encounter (Signed)
Patient called requesting orders for outpatient occupational and physical therapy.  Please advise.

## 2013-08-09 NOTE — Telephone Encounter (Signed)
Done. thanks

## 2013-08-09 NOTE — Telephone Encounter (Signed)
Yes referral to neuro rehab.

## 2013-08-15 ENCOUNTER — Telehealth: Payer: Self-pay

## 2013-08-15 NOTE — Telephone Encounter (Signed)
Junious Dresser an occupational therapist with [facility] called requesting patient get referred for outpatient OT and PT.  Please advise.

## 2013-08-16 NOTE — Telephone Encounter (Signed)
i have already done so

## 2013-08-26 ENCOUNTER — Ambulatory Visit: Payer: BC Managed Care – PPO | Admitting: Internal Medicine

## 2013-08-26 ENCOUNTER — Telehealth: Payer: Self-pay

## 2013-08-26 NOTE — Telephone Encounter (Signed)
Patient called regarding when he returning to work.  He would like to return earlier.  He also would like a medical accommidation form filled out and a handicap sticker.

## 2013-08-26 NOTE — Telephone Encounter (Signed)
Unable to contact patient.  Number called was invalid.

## 2013-08-27 ENCOUNTER — Observation Stay (HOSPITAL_COMMUNITY): Payer: BC Managed Care – PPO

## 2013-08-27 ENCOUNTER — Observation Stay (HOSPITAL_COMMUNITY)
Admission: EM | Admit: 2013-08-27 | Discharge: 2013-08-28 | Disposition: A | Discharge status: Home / Self Care | Payer: BC Managed Care – PPO | Attending: Surgery | Admitting: Surgery

## 2013-08-27 ENCOUNTER — Encounter (HOSPITAL_COMMUNITY): Payer: Self-pay | Admitting: Emergency Medicine

## 2013-08-27 ENCOUNTER — Emergency Department (HOSPITAL_COMMUNITY): Payer: BC Managed Care – PPO

## 2013-08-27 DIAGNOSIS — I10 Essential (primary) hypertension: Secondary | ICD-10-CM | POA: Insufficient documentation

## 2013-08-27 DIAGNOSIS — R079 Chest pain, unspecified: Principal | ICD-10-CM | POA: Insufficient documentation

## 2013-08-27 DIAGNOSIS — R110 Nausea: Secondary | ICD-10-CM | POA: Insufficient documentation

## 2013-08-27 DIAGNOSIS — R1013 Epigastric pain: Secondary | ICD-10-CM | POA: Insufficient documentation

## 2013-08-27 DIAGNOSIS — R1011 Right upper quadrant pain: Secondary | ICD-10-CM

## 2013-08-27 DIAGNOSIS — R9431 Abnormal electrocardiogram [ECG] [EKG]: Secondary | ICD-10-CM | POA: Insufficient documentation

## 2013-08-27 DIAGNOSIS — Z91010 Allergy to peanuts: Secondary | ICD-10-CM | POA: Insufficient documentation

## 2013-08-27 DIAGNOSIS — K8020 Calculus of gallbladder without cholecystitis without obstruction: Secondary | ICD-10-CM | POA: Insufficient documentation

## 2013-08-27 DIAGNOSIS — Z8673 Personal history of transient ischemic attack (TIA), and cerebral infarction without residual deficits: Secondary | ICD-10-CM | POA: Insufficient documentation

## 2013-08-27 DIAGNOSIS — Z888 Allergy status to other drugs, medicaments and biological substances status: Secondary | ICD-10-CM | POA: Insufficient documentation

## 2013-08-27 HISTORY — DX: Nontraumatic intracerebral hemorrhage, unspecified: I61.9

## 2013-08-27 HISTORY — DX: Cerebral infarction, unspecified: I63.9

## 2013-08-27 LAB — COMPREHENSIVE METABOLIC PANEL
AST: 83 U/L — ABNORMAL HIGH (ref 0–37)
CO2: 27 mEq/L (ref 19–32)
Calcium: 9.2 mg/dL (ref 8.4–10.5)
Chloride: 102 mEq/L (ref 96–112)
Creatinine, Ser: 1.44 mg/dL — ABNORMAL HIGH (ref 0.50–1.35)
GFR calc non Af Amer: 65 mL/min — ABNORMAL LOW (ref >90)
Sodium: 140 mEq/L (ref 135–145)
Total Bilirubin: 0.8 mg/dL (ref 0.3–1.2)

## 2013-08-27 LAB — CBC WITH DIFFERENTIAL/PLATELET
Basophils Absolute: 0 10*3/uL (ref 0.0–0.1)
Basophils Relative: 1 % (ref 0–1)
Eosinophils Relative: 2 % (ref 0–5)
HCT: 41.5 % (ref 39.0–52.0)
Lymphocytes Relative: 25 % (ref 12–46)
MCH: 30.8 pg (ref 26.0–34.0)
MCHC: 35.7 g/dL (ref 30.0–36.0)
MCV: 86.3 fL (ref 78.0–100.0)
Monocytes Absolute: 0.4 10*3/uL (ref 0.1–1.0)
RDW: 12.3 % (ref 11.5–15.5)

## 2013-08-27 LAB — URINALYSIS, ROUTINE W REFLEX MICROSCOPIC
Hgb urine dipstick: NEGATIVE
Ketones, ur: NEGATIVE mg/dL
Leukocytes, UA: NEGATIVE
Nitrite: NEGATIVE
Specific Gravity, Urine: 1.012 (ref 1.005–1.030)
Urobilinogen, UA: 2 mg/dL — ABNORMAL HIGH (ref 0.0–1.0)

## 2013-08-27 LAB — LIPASE, BLOOD: Lipase: 45 U/L (ref 11–59)

## 2013-08-27 LAB — POCT I-STAT TROPONIN I: Troponin i, poc: 0.01 ng/mL (ref 0.00–0.08)

## 2013-08-27 MED ORDER — TECHNETIUM TC 99M MEBROFENIN IV KIT
5.0000 | PACK | Freq: Once | INTRAVENOUS | Status: AC | PRN
  Administered 2013-08-27: 5 via INTRAVENOUS

## 2013-08-27 MED ORDER — POTASSIUM CHLORIDE IN NACL 20-0.9 MEQ/L-% IV SOLN
INTRAVENOUS | Status: DC
  Administered 2013-08-27 – 2013-08-28 (×2): via INTRAVENOUS
  Filled 2013-08-27 (×5): qty 1000

## 2013-08-27 MED ORDER — MORPHINE SULFATE 4 MG/ML IJ SOLN
4.0000 mg | Freq: Once | INTRAMUSCULAR | Status: AC
  Administered 2013-08-27: 4 mg via INTRAVENOUS
  Filled 2013-08-27: qty 1

## 2013-08-27 MED ORDER — SINCALIDE 5 MCG IJ SOLR
0.0200 ug/kg | Freq: Once | INTRAMUSCULAR | Status: AC
  Administered 2013-08-27: 1.96 ug via INTRAVENOUS

## 2013-08-27 MED ORDER — ENOXAPARIN SODIUM 30 MG/0.3ML ~~LOC~~ SOLN
30.0000 mg | SUBCUTANEOUS | Status: DC
  Filled 2013-08-27 (×3): qty 0.3

## 2013-08-27 MED ORDER — BENAZEPRIL HCL 20 MG PO TABS
20.0000 mg | ORAL_TABLET | Freq: Two times a day (BID) | ORAL | Status: DC
  Administered 2013-08-27 – 2013-08-28 (×2): 20 mg via ORAL
  Filled 2013-08-27 (×5): qty 1

## 2013-08-27 MED ORDER — HYDROCODONE-ACETAMINOPHEN 5-325 MG PO TABS: 1.0000 | ORAL_TABLET | ORAL | Status: DC | PRN

## 2013-08-27 MED ORDER — AMLODIPINE BESYLATE 10 MG PO TABS
10.0000 mg | ORAL_TABLET | Freq: Every day | ORAL | Status: DC
  Administered 2013-08-27: 10 mg via ORAL
  Filled 2013-08-27 (×3): qty 1

## 2013-08-27 MED ORDER — LABETALOL HCL 300 MG PO TABS
300.0000 mg | ORAL_TABLET | Freq: Two times a day (BID) | ORAL | Status: DC
Start: 2013-08-27 — End: 2013-08-28
  Administered 2013-08-27 – 2013-08-28 (×3): 300 mg via ORAL
  Filled 2013-08-27 (×5): qty 1

## 2013-08-27 MED ORDER — MORPHINE SULFATE 2 MG/ML IJ SOLN: 2.0000 mg | INTRAMUSCULAR | Status: DC | PRN

## 2013-08-27 MED ORDER — PIPERACILLIN-TAZOBACTAM 3.375 G IVPB
3.3750 g | Freq: Three times a day (TID) | INTRAVENOUS | Status: DC
  Administered 2013-08-27 – 2013-08-28 (×4): 3.375 g via INTRAVENOUS
  Filled 2013-08-27 (×6): qty 50

## 2013-08-27 MED ORDER — SODIUM CHLORIDE 0.9 % IV SOLN
1000.0000 mL | INTRAVENOUS | Status: DC
  Administered 2013-08-27: 1000 mL via INTRAVENOUS

## 2013-08-27 MED ORDER — ONDANSETRON HCL 4 MG/2ML IJ SOLN
4.0000 mg | Freq: Once | INTRAMUSCULAR | Status: AC
  Administered 2013-08-27: 4 mg via INTRAVENOUS
  Filled 2013-08-27: qty 2

## 2013-08-27 MED ORDER — HYDROCHLOROTHIAZIDE 25 MG PO TABS
25.0000 mg | ORAL_TABLET | Freq: Every day | ORAL | Status: DC
  Administered 2013-08-27 – 2013-08-28 (×2): 25 mg via ORAL
  Filled 2013-08-27 (×2): qty 1

## 2013-08-27 MED ORDER — ONDANSETRON HCL 4 MG/2ML IJ SOLN: 4.0000 mg | Freq: Four times a day (QID) | INTRAMUSCULAR | Status: DC | PRN

## 2013-08-27 MED ORDER — SODIUM CHLORIDE 0.9 % IV SOLN
1000.0000 mL | Freq: Once | INTRAVENOUS | Status: AC
  Administered 2013-08-27: 1000 mL via INTRAVENOUS

## 2013-08-27 MED ORDER — ACETAMINOPHEN 325 MG PO TABS: 325.0000 mg | ORAL_TABLET | ORAL | Status: DC | PRN

## 2013-08-27 MED ORDER — PANTOPRAZOLE SODIUM 40 MG IV SOLR
40.0000 mg | Freq: Once | INTRAVENOUS | Status: AC
  Administered 2013-08-27: 40 mg via INTRAVENOUS
  Filled 2013-08-27: qty 40

## 2013-08-27 NOTE — H&P (Signed)
Chief Complaint: RUQ abdominal pain  HPI: Paul Reeves is a 27 year old male with a history of hypertension, ICH in October of 2014 who presented to [facility] this morning with RUQ abdominal pain.  Duration of symptoms is 11 hours.  Onset was sudden last night at 9PM.  Coarse is improving.  Characterized as sharp stabbing pain.  Associated with malaise, anorexia.  He denies nausea or vomiting.  Denies fever, chills or sweats.  No previous symptoms.  He initially thought it was indigestion, tried to wait it out, however, symptoms worsened.  No modifying factors.  No aggravating or alleviating factors.  He denies any anticoagulation therapy, NSAIDs, ASA.  He is followed by Dr. Pearlean Brownie for the recent stroke.  He reports minimal left sided weakness.  Past Medical History  Diagnosis Date  . Hypertension 06/26/2013    new dx  . Seasonal allergies     in summer and spring.  . Stroke     History reviewed. No pertinent past surgical history.  Family History  Problem Relation Age of Onset  . Hypertension Father   . Diabetes Maternal Grandmother    Social History:  reports that he has never smoked. He has never used smokeless tobacco. He reports that he drinks about 0.6 ounces of alcohol per week. He reports that he does not use illicit drugs.  Allergies:  Allergies  Allergen Reactions  . Peanuts [Peanut Oil] Anaphylaxis  . Naproxen Rash     (Not in a hospital admission)  Results for orders placed during the hospital encounter of 08/27/13 (from the past 48 hour(s))  CBC WITH DIFFERENTIAL     Status: None   Collection Time    08/27/13  1:30 AM      Result Value Range   WBC 5.7  4.0 - 10.5 K/uL   RBC 4.81  4.22 - 5.81 MIL/uL   Hemoglobin 14.8  13.0 - 17.0 g/dL   HCT 41.3  24.4 - 01.0 %   MCV 86.3  78.0 - 100.0 fL   MCH 30.8  26.0 - 34.0 pg   MCHC 35.7  30.0 - 36.0 g/dL   RDW 27.2  53.6 - 64.4 %   Platelets 249  150 - 400 K/uL   Neutrophils Relative % 65  43 - 77 %   Neutro Abs 3.7   1.7 - 7.7 K/uL   Lymphocytes Relative 25  12 - 46 %   Lymphs Abs 1.4  0.7 - 4.0 K/uL   Monocytes Relative 8  3 - 12 %   Monocytes Absolute 0.4  0.1 - 1.0 K/uL   Eosinophils Relative 2  0 - 5 %   Eosinophils Absolute 0.1  0.0 - 0.7 K/uL   Basophils Relative 1  0 - 1 %   Basophils Absolute 0.0  0.0 - 0.1 K/uL  COMPREHENSIVE METABOLIC PANEL     Status: Abnormal   Collection Time    08/27/13  1:30 AM      Result Value Range   Sodium 140  135 - 145 mEq/L   Potassium 3.2 (*) 3.5 - 5.1 mEq/L   Chloride 102  96 - 112 mEq/L   CO2 27  19 - 32 mEq/L   Glucose, Bld 128 (*) 70 - 99 mg/dL   BUN 15  6 - 23 mg/dL   Creatinine, Ser 0.34 (*) 0.50 - 1.35 mg/dL   Calcium 9.2  8.4 - 74.2 mg/dL   Total Protein 7.2  6.0 - 8.3 g/dL  Albumin 3.8  3.5 - 5.2 g/dL   AST 83 (*) 0 - 37 U/L   ALT 59 (*) 0 - 53 U/L   Alkaline Phosphatase 66  39 - 117 U/L   Total Bilirubin 0.8  0.3 - 1.2 mg/dL   GFR calc non Af Amer 65 (*) >90 mL/min   GFR calc Af Amer 75 (*) >90 mL/min   Comment: (NOTE)     The eGFR has been calculated using the CKD EPI equation.     This calculation has not been validated in all clinical situations.     eGFR's persistently <90 mL/min signify possible Chronic Kidney     Disease.  LIPASE, BLOOD     Status: None   Collection Time    08/27/13  1:30 AM      Result Value Range   Lipase 45  11 - 59 U/L  POCT I-STAT TROPONIN I     Status: None   Collection Time    08/27/13  1:58 AM      Result Value Range   Troponin i, poc 0.01  0.00 - 0.08 ng/mL   Comment 3            Comment: Due to the release kinetics of cTnI,     a negative result within the first hours     of the onset of symptoms does not rule out     myocardial infarction with certainty.     If myocardial infarction is still suspected,     repeat the test at appropriate intervals.  URINALYSIS, ROUTINE W REFLEX MICROSCOPIC     Status: Abnormal   Collection Time    08/27/13  5:37 AM      Result Value Range   Color, Urine  YELLOW  YELLOW   APPearance CLEAR  CLEAR   Specific Gravity, Urine 1.012  1.005 - 1.030   pH 6.5  5.0 - 8.0   Glucose, UA NEGATIVE  NEGATIVE mg/dL   Hgb urine dipstick NEGATIVE  NEGATIVE   Bilirubin Urine NEGATIVE  NEGATIVE   Ketones, ur NEGATIVE  NEGATIVE mg/dL   Protein, ur NEGATIVE  NEGATIVE mg/dL   Urobilinogen, UA 2.0 (*) 0.0 - 1.0 mg/dL   Nitrite NEGATIVE  NEGATIVE   Leukocytes, UA NEGATIVE  NEGATIVE   Comment: MICROSCOPIC NOT DONE ON URINES WITH NEGATIVE PROTEIN, BLOOD, LEUKOCYTES, NITRITE, OR GLUCOSE <1000 mg/dL.   US Abdomen Complete  08/27/2013   CLINICAL DATA:  Epigastric pain, elevated LFTs.  EXAM: ULTRASOUND ABDOMEN COMPLETE  COMPARISON:  None.  FINDINGS: Gallbladder:  Thickened gallbladder wall at 4 mm. Gallbladder stones and sludge. Negative sonographic Murphy sign.  Common bile duct:  Diameter: Where seen, measuring 4 mm in diameter, difficult to visualize due to overlying bowel gas artifact.  Liver:  Heterogeneous echogenicity. This limits sensitivity for focal lesion detection. Hypoechoic area adjacent to the gallbladder fossa is favored to reflect an area of fatty sparing.  IVC:  No abnormality visualized.  Pancreas:  Portions of the distal body and tail obscured by overlying bowel gas artifact. No appreciable abnormality within the proximal body/ neck or head.  Spleen:  Size and appearance within normal limits.  Measures 8.9 cm.  Right Kidney:  Length: 9.5. Echogenicity within normal limits. No mass or hydronephrosis visualized.  Left Kidney:  Length: 11.7. Echogenicity within normal limits. No mass or hydronephrosis visualized.  Abdominal aorta:  No aneurysm visualized.  Other findings:  None.  IMPRESSION: Gallstones and gallbladder wall thickening. Negative sonographic  Murphy sign. If clinical concern for acute cholecystitis persists, recommend HIDA scan.  Heterogeneous/increased liver echogenicity. Favored to reflect fatty infiltration. Geographic hypoechoic focus adjacent to  the gallbladder fossa is favored to reflect an area of fatty sparing.   Electronically Signed   By: Jearld Lesch M.D.   On: 08/27/2013 07:01   Dg Abd Acute W/chest  08/27/2013   CLINICAL DATA:  Sudden onset left lower quadrant abdominal pain  EXAM: ACUTE ABDOMEN SERIES (ABDOMEN 2 VIEW & CHEST 1 VIEW)  COMPARISON:  None.  FINDINGS: No lung consolidation. Cardiomediastinal contours within normal range. No free intraperitoneal air. Nonobstructive bowel gas pattern. Rounded sclerotic focus projecting over the left iliac bone is likely a bone island. No acute osseous finding.  IMPRESSION: Nonobstructive bowel gas pattern.   Electronically Signed   By: Jearld Lesch M.D.   On: 08/27/2013 02:32    Review of Systems  All other systems reviewed and are negative.    Blood pressure 128/82, pulse 60, temperature 98.8 F (37.1 C), temperature source Oral, resp. rate 16, height 6' (1.829 m), weight 216 lb (97.977 kg), SpO2 97.00%. Physical Exam  Constitutional: He is oriented to person, place, and time. He appears well-developed and well-nourished. No distress.  HENT:  Head: Normocephalic and atraumatic.  Mouth/Throat: No oropharyngeal exudate.  Eyes: Conjunctivae and EOM are normal. Pupils are equal, round, and reactive to light. No scleral icterus.  Neck: Normal range of motion. Neck supple.  Cardiovascular: Normal rate, regular rhythm, normal heart sounds and intact distal pulses.  Exam reveals no friction rub.   No murmur heard. Respiratory: Effort normal and breath sounds normal. No respiratory distress. He has no wheezes. He has no rales. He exhibits no tenderness.  GI: Soft. Bowel sounds are normal. He exhibits no distension and no mass. There is no rebound and no guarding.  TTP LUQ  Musculoskeletal: He exhibits no edema and no tenderness.  Neurological: He is alert and oriented to person, place, and time.  Skin: Skin is warm and dry. No rash noted. He is not diaphoretic. No erythema. No  pallor.  Psychiatric: He has a normal mood and affect. His behavior is normal. Judgment and thought content normal.     Assessment/Plan Symptomatic Cholelithiasis he has wall thickening on ultrasound and gallstones.  We will admit to observation and proceed with a laparoscopic cholecystectomy later today or tomorrow.  -IVF -pain control -antiemetics -repeat labs in AM -VTE prophylaxis -atbx -obtain consent  Renal insufficiency -stable, monitor renal function  HTN -continue with home meds  I spoke with patients mom at his request  Nabilah Davoli ANP-BC  08/27/2013, 8:05 AM

## 2013-08-27 NOTE — Progress Notes (Signed)
Arrived to room 6n12, denies nausea/pain at this time, oriented to room and surroundings

## 2013-08-27 NOTE — ED Notes (Signed)
Patient transported to XR. 

## 2013-08-27 NOTE — ED Notes (Signed)
Returned from U/S

## 2013-08-27 NOTE — ED Notes (Signed)
Per EMS patient c/o sudden onset LUQ abd pain that woke him from sleep.  Patient had stroke in oct and is undergoing PT.  Patient is also c/o epigastric pain that is burning and started about the same time.  No sob, dizziness, nvd, changes in vision.  Hypertensive with EMS 180/110 and 138/84. Left sided weakness from PREVIOUS stroke.

## 2013-08-27 NOTE — ED Notes (Signed)
Pt sleeping comfortably in bed. Offers no complaints at this time.

## 2013-08-27 NOTE — H&P (Signed)
Agree with A&P of ER,NP. Patient seen early this am and again at 1645. He is pain free at the moment. Hhis HIDA andEF are normal, so do not believe he has symptomatic gallstones. Will feed tonight and if does well can go home in am

## 2013-08-27 NOTE — ED Provider Notes (Signed)
CSN: [id number]     Arrival date & time 08/27/13  0050 History   First MD Initiated Contact with Patient 08/27/13 0112     Chief Complaint  Patient presents with  . Abdominal Pain  . Chest Pain    worse with inspiration   (Consider location/radiation/quality/duration/timing/severity/associated sxs/prior Treatment) HPI Patient presents with epigastric pain starting this evening while lying down. The pain radiates up into the lower chest which he describes as "indigestion" type pain. He has no shortness of breath, nausea, vomiting. The pain is described as burning. He has had similar symptoms in the past. Denies any melena or blood in the stool. Past Medical History  Diagnosis Date  . Hypertension 06/26/2013    new dx  . Seasonal allergies     in summer and spring.  . Stroke    History reviewed. No pertinent past surgical history. Family History  Problem Relation Age of Onset  . Hypertension Father   . Diabetes Maternal Grandmother    History  Substance Use Topics  . Smoking status: Never Smoker   . Smokeless tobacco: Never Used  . Alcohol Use: 0.6 oz/week    1 Glasses of wine per week     Comment: glass of wine a a month or two    Review of Systems  Constitutional: Negative for fever, chills and fatigue.  Respiratory: Negative for shortness of breath.   Cardiovascular: Negative for chest pain.  Gastrointestinal: Positive for abdominal pain. Negative for nausea, vomiting, diarrhea, constipation and blood in stool.  Genitourinary: Negative for dysuria, frequency, hematuria and flank pain.  Musculoskeletal: Negative for back pain, myalgias, neck pain and neck stiffness.  Skin: Negative for rash and wound.  Neurological: Negative for dizziness, syncope, weakness, light-headedness, numbness and headaches.  All other systems reviewed and are negative.    Allergies  Peanuts and Naproxen  Home Medications   Current Outpatient Rx  Name  Route  Sig  Dispense  Refill  .  acetaminophen (TYLENOL) 325 MG tablet   Oral   Take 1-2 tablets (325-650 mg total) by mouth every 4 (four) hours as needed.         Marland Kitchen amLODipine (NORVASC) 10 MG tablet   Oral   Take 1 tablet (10 mg total) by mouth at bedtime.   30 tablet   1   . benazepril (LOTENSIN) 20 MG tablet   Oral   Take 1 tablet (20 mg total) by mouth 2 (two) times daily.   60 tablet   3   . hydrochlorothiazide (HYDRODIURIL) 25 MG tablet   Oral   Take 1 tablet (25 mg total) by mouth daily.   30 tablet   1   . labetalol (NORMODYNE) 300 MG tablet   Oral   Take 1 tablet (300 mg total) by mouth 2 (two) times daily.   60 tablet   1    BP 139/83  Pulse 81  Temp(Src) 98.8 F (37.1 C) (Oral)  Resp 14  Ht 6' (1.829 m)  Wt 216 lb (97.977 kg)  BMI 29.29 kg/m2  SpO2 100% Physical Exam  Nursing note and vitals reviewed. Constitutional: He is oriented to person, place, and time. He appears well-developed and well-nourished. No distress.  HENT:  Head: Normocephalic and atraumatic.  Mouth/Throat: Oropharynx is clear and moist.  Eyes: EOM are normal. Pupils are equal, round, and reactive to light.  Neck: Normal range of motion. Neck supple.  Cardiovascular: Normal rate and regular rhythm.   Pulmonary/Chest: Effort normal and  breath sounds normal. No respiratory distress. He has no wheezes. He has no rales.  Abdominal: Soft. Bowel sounds are normal. He exhibits no distension and no mass. There is tenderness (tenderness to palpation in the epigastrium. No rebound guarding.). There is no rebound and no guarding.  Musculoskeletal: Normal range of motion. He exhibits no edema and no tenderness.  No calf swelling or tenderness.  Neurological: He is alert and oriented to person, place, and time.  Skin: Skin is warm and dry. No rash noted. No erythema.  Psychiatric: He has a normal mood and affect. His behavior is normal.    ED Course  Procedures (including critical care time) Labs Review Labs Reviewed   COMPREHENSIVE METABOLIC PANEL - Abnormal; Notable for the following:    Potassium 3.2 (*)    Glucose, Bld 128 (*)    Creatinine, Ser 1.44 (*)    ALT 59 (*)    GFR calc non Af Amer 65 (*)    GFR calc Af Amer 75 (*)    All other components within normal limits  CBC WITH DIFFERENTIAL  LIPASE, BLOOD  URINALYSIS, ROUTINE W REFLEX MICROSCOPIC  POCT I-STAT TROPONIN I   Imaging Review Dg Abd Acute W/chest  08/27/2013   CLINICAL DATA:  Sudden onset left lower quadrant abdominal pain  EXAM: ACUTE ABDOMEN SERIES (ABDOMEN 2 VIEW & CHEST 1 VIEW)  COMPARISON:  None.  FINDINGS: No lung consolidation. Cardiomediastinal contours within normal range. No free intraperitoneal air. Nonobstructive bowel gas pattern. Rounded sclerotic focus projecting over the left iliac bone is likely a bone island. No acute osseous finding.  IMPRESSION: Nonobstructive bowel gas pattern.   Electronically Signed   By: Jearld Lesch M.D.   On: 08/27/2013 02:32    EKG Interpretation   None       MDM  Patient continues to have mild epigastric tenderness. Discussed with general surgery and will see in emergency department and assist with disposition.   Loren Racer, MD 08/28/13 [phone]

## 2013-08-27 NOTE — ED Notes (Signed)
Patient is resting comfortably. 

## 2013-08-27 NOTE — ED Notes (Signed)
General Surgery at bedside.

## 2013-08-28 LAB — CBC
HCT: 38.9 % — ABNORMAL LOW (ref 39.0–52.0)
Hemoglobin: 13.4 g/dL (ref 13.0–17.0)
MCH: 30.7 pg (ref 26.0–34.0)
MCHC: 34.4 g/dL (ref 30.0–36.0)
MCV: 89 fL (ref 78.0–100.0)

## 2013-08-28 LAB — BASIC METABOLIC PANEL
BUN: 8 mg/dL (ref 6–23)
CO2: 26 mEq/L (ref 19–32)
Calcium: 9.1 mg/dL (ref 8.4–10.5)
Chloride: 109 mEq/L (ref 96–112)
Creatinine, Ser: 1.37 mg/dL — ABNORMAL HIGH (ref 0.50–1.35)
GFR calc Af Amer: 80 mL/min — ABNORMAL LOW (ref >90)
Glucose, Bld: 85 mg/dL (ref 70–99)
Potassium: 3.3 mEq/L — ABNORMAL LOW (ref 3.5–5.1)

## 2013-08-28 MED ORDER — ENOXAPARIN SODIUM 40 MG/0.4ML ~~LOC~~ SOLN: 40.0000 mg | SUBCUTANEOUS | Status: DC

## 2013-08-28 NOTE — Discharge Summary (Signed)
Agree with plans I discussed this with him last night as well

## 2013-08-28 NOTE — Discharge Summary (Signed)
Physician Discharge Summary  [place] Rackham RUE:[id number] DOB: 1986/02/25 DOA: 08/27/2013  PCP: Sissy Hoff, MD  Consultation: none  Admit date: 08/27/2013 Discharge date: 08/28/2013  Recommendations for Outpatient Follow-up:   Follow-up Information   Follow up with CCS,MD, MD. (please call to schedule a new patient appointment with a surgeon in 3-4 weeks. reason: gallstones)    Specialty:  General Surgery   Contact information:   [address] [place] [address] [phone]      Discharge Diagnoses:  1. Cholelithiasis  2. Hx of ICH 3. HTN   Surgical Procedure: none  Discharge Condition: stable Disposition: home  Diet recommendation: regular  Filed Weights   08/27/13 0100  Weight: 216 lb (97.977 kg)    Filed Vitals:   08/28/13 0552  BP: 141/90  Pulse: 60  Temp: 98.6 F (37 C)  Resp: 18      Hospital Course:  Robin Pafford is a 27 year old male with a history of hypertension, ICH in October of 2014 who presented to [facility] this morning with RUQ abdominal pain.  A US showed evidence of cholelithiasis.  A follow up HIDA scan was normal.  He was kept overnight to ensure his symptoms resolved.  On HD #2 he was tolerating a diet, VSS, no abdominal pain and therefore felt stable for discharge.  He was advised to schedule a follow up in our office in 3-4 weeks.  We discussed warning signs that warrant urgent attention.  He was encouraged to call with questions or concerns.    Physical exam: General appearance: alert and oriented. Calm and cooperative No acute distress. VSS. Afebrile.  GI: soft round and nontender. +BS x4 quadrants. No organomegaly, hernias or masses.    Discharge Instructions   Future Appointments Provider Department Dept Phone   09/06/2013 2:30 PM Ronal Fear, NP [facility] [phone]   10/01/2013 10:00 AM Ranelle Oyster, MD [facility] [phone]   10/07/2013  3:00 PM Pricilla Riffle, MD [facility] [place] Office [phone]       Medication List         acetaminophen 325 MG tablet  Commonly known as:  TYLENOL  Take 1-2 tablets (325-650 mg total) by mouth every 4 (four) hours as needed.     amLODipine 10 MG tablet  Commonly known as:  NORVASC  Take 1 tablet (10 mg total) by mouth at bedtime.     benazepril 20 MG tablet  Commonly known as:  LOTENSIN  Take 1 tablet (20 mg total) by mouth 2 (two) times daily.     hydrochlorothiazide 25 MG tablet  Commonly known as:  HYDRODIURIL  Take 1 tablet (25 mg total) by mouth daily.     labetalol 300 MG tablet  Commonly known as:  NORMODYNE  Take 1 tablet (300 mg total) by mouth 2 (two) times daily.           Follow-up Information   Follow up with CCS,MD, MD. (please call to schedule a new patient appointment with a surgeon in 3-4 weeks. reason: gallstones)    Specialty:  General Surgery   Contact information:   [address] [place] [address] [phone]        The results of significant diagnostics from this hospitalization (including imaging, microbiology, ancillary and laboratory) are listed below for reference.    Significant Diagnostic Studies: Nm Hepatobiliary  08/27/2013   CLINICAL DATA:  Abdominal pain with nausea  EXAM: NUCLEAR MEDICINE HEPATOBILIARY IMAGING WITH GALLBLADDER EF  Views:  Anterior, right lateral right upper quadrant  Radionuclide: Technetium 10m Choletec  Dose:  5.0 mCi  Route of administration: Intravenous  COMPARISON:  None.  FINDINGS: Liver uptake of radiotracer is normal. There is prompt visualization of gallbladder and small bowel, indicating patency of the cystic and common bile ducts.  A weight based dose, 1.96 mcg, of CCK was administered intravenously with calculation of the computer generated ejection fraction of radiotracer from the gallbladder. The ejection fraction of radiotracer from the gallbladder is normal at 39.7%, normal  greater than 38%.  IMPRESSION: Study within normal limits.   Electronically Signed   By: Bretta Bang M.D.   On: 08/27/2013 14:59   US Abdomen Complete  08/27/2013   CLINICAL DATA:  Epigastric pain, elevated LFTs.  EXAM: ULTRASOUND ABDOMEN COMPLETE  COMPARISON:  None.  FINDINGS: Gallbladder:  Thickened gallbladder wall at 4 mm. Gallbladder stones and sludge. Negative sonographic Murphy sign.  Common bile duct:  Diameter: Where seen, measuring 4 mm in diameter, difficult to visualize due to overlying bowel gas artifact.  Liver:  Heterogeneous echogenicity. This limits sensitivity for focal lesion detection. Hypoechoic area adjacent to the gallbladder fossa is favored to reflect an area of fatty sparing.  IVC:  No abnormality visualized.  Pancreas:  Portions of the distal body and tail obscured by overlying bowel gas artifact. No appreciable abnormality within the proximal body/ neck or head.  Spleen:  Size and appearance within normal limits.  Measures 8.9 cm.  Right Kidney:  Length: 9.5. Echogenicity within normal limits. No mass or hydronephrosis visualized.  Left Kidney:  Length: 11.7. Echogenicity within normal limits. No mass or hydronephrosis visualized.  Abdominal aorta:  No aneurysm visualized.  Other findings:  None.  IMPRESSION: Gallstones and gallbladder wall thickening. Negative sonographic Murphy sign. If clinical concern for acute cholecystitis persists, recommend HIDA scan.  Heterogeneous/increased liver echogenicity. Favored to reflect fatty infiltration. Geographic hypoechoic focus adjacent to the gallbladder fossa is favored to reflect an area of fatty sparing.   Electronically Signed   By: Jearld Lesch M.D.   On: 08/27/2013 07:01   Dg Abd Acute W/chest  08/27/2013   CLINICAL DATA:  Sudden onset left lower quadrant abdominal pain  EXAM: ACUTE ABDOMEN SERIES (ABDOMEN 2 VIEW & CHEST 1 VIEW)  COMPARISON:  None.  FINDINGS: No lung consolidation. Cardiomediastinal contours within normal  range. No free intraperitoneal air. Nonobstructive bowel gas pattern. Rounded sclerotic focus projecting over the left iliac bone is likely a bone island. No acute osseous finding.  IMPRESSION: Nonobstructive bowel gas pattern.   Electronically Signed   By: Jearld Lesch M.D.   On: 08/27/2013 02:32    Microbiology: No results found for this or any previous visit (from the past 240 hour(s)).   Labs: Basic Metabolic Panel:  Recent Labs Lab 08/27/13 0130 08/28/13 0445  NA 140 144  K 3.2* 3.3*  CL 102 109  CO2 27 26  GLUCOSE 128* 85  BUN 15 8  CREATININE 1.44* 1.37*  CALCIUM 9.2 9.1   Liver Function Tests:  Recent Labs Lab 08/27/13 0130  AST 83*  ALT 59*  ALKPHOS 66  BILITOT 0.8  PROT 7.2  ALBUMIN 3.8    Recent Labs Lab 08/27/13 0130  LIPASE 45   No results found for this basename: AMMONIA,  in the last 168 hours CBC:  Recent Labs Lab 08/27/13 0130 08/28/13 0445  WBC 5.7 5.0  NEUTROABS 3.7  --  HGB 14.8 13.4  HCT 41.5 38.9*  MCV 86.3 89.0  PLT 249 230   Cardiac Enzymes: No results found for this basename: CKTOTAL, CKMB, CKMBINDEX, TROPONINI,  in the last 168 hours BNP: BNP (last 3 results) No results found for this basename: PROBNP,  in the last 8760 hours CBG: No results found for this basename: GLUCAP,  in the last 168 hours  Active Problems:   Cholelithiasis   Time coordinating discharge: <30 mins  Signed:  Gertrue Willette, ANP-BC

## 2013-09-04 ENCOUNTER — Ambulatory Visit: Payer: BC Managed Care – PPO

## 2013-09-04 ENCOUNTER — Encounter: Payer: BC Managed Care – PPO | Admitting: Occupational Therapy

## 2013-09-06 ENCOUNTER — Encounter: Payer: Self-pay | Admitting: Nurse Practitioner

## 2013-09-06 ENCOUNTER — Ambulatory Visit (INDEPENDENT_AMBULATORY_CARE_PROVIDER_SITE_OTHER): Payer: BC Managed Care – PPO | Admitting: Nurse Practitioner

## 2013-09-06 VITALS — BP 115/81 | HR 91 | Ht 71.0 in | Wt 203.0 lb

## 2013-09-06 DIAGNOSIS — G8114 Spastic hemiplegia affecting left nondominant side: Secondary | ICD-10-CM

## 2013-09-06 DIAGNOSIS — I619 Nontraumatic intracerebral hemorrhage, unspecified: Secondary | ICD-10-CM

## 2013-09-06 DIAGNOSIS — G8110 Spastic hemiplegia affecting unspecified side: Secondary | ICD-10-CM

## 2013-09-06 MED ORDER — TIZANIDINE HCL 2 MG PO TABS: 2.0000 mg | ORAL_TABLET | Freq: Three times a day (TID) | ORAL | Status: DC | PRN

## 2013-09-06 MED ORDER — GABAPENTIN 300 MG PO CAPS: 300.0000 mg | ORAL_CAPSULE | Freq: Three times a day (TID) | ORAL | Status: DC

## 2013-09-06 NOTE — Progress Notes (Signed)
PATIENT: Paul Reeves DOB: 03-25-1986   REASON FOR VISIT: hospital follow up for ICH HISTORY FROM: patient, mother  HISTORY OF PRESENT ILLNESS: Paul Reeves is a 27 y.o. male with history of HTN who was found on the bathroom floor by family with left sided weakness, difficulty talking and inability to move on 06/26/13. Blood pressure SBP-250 per EMS reports. On arrival he complained of HA and nausea, but denies vertigo, double vision, difficulty swallowing, confusion, or visual disturbances.   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 2D Echocardiogram with EF 55%, wall motion normal, left atrium mildly dilated. No ASD or PFO identified.  Carotid Doppler shows Bilateral 1-39% internal carotid artery stenosis. Renal ultrasound shows no evidence of renal artery stenosis bilaterally.  He has had [facility] for PT,OT.  He had not been treated previously for HTN.  There is strong family history for difficult to treat HTN.  He was recently readmitted for left abdominal pain; found to have gallstones; but these were not thought to be symptomatic.  A follow up HIDA scan was normal. He was kept overnight to ensure his symptoms resolved. On HD #2 he was tolerating a diet, VSS, no abdominal pain and therefore felt stable for discharge. He was advised to schedule a follow up in our office in 3-4 weeks. He has continued to have nausea and pain.    Stroke recovery has been going well.  He has regained most strength in left arm and leg, still with mild weakness and left foot drop. Wearing AFO and ambulating with cane.  He has returned to work as a Pharmacist, community.  Headaches have resolved.  Has mild left arm sensory deficit with paresthesia.  REVIEW OF SYSTEMS: Full 14 system review of systems performed and notable only for:  Gastroitestinal: abdominal pain, nausea Neurological: dizziness  ALLERGIES: Allergies  Allergen  Reactions  . Peanuts [Peanut Oil] Anaphylaxis  . Naproxen Rash    HOME MEDICATIONS: Outpatient Prescriptions Prior to Visit  Medication Sig Dispense Refill  . acetaminophen (TYLENOL) 325 MG tablet Take 1-2 tablets (325-650 mg total) by mouth every 4 (four) hours as needed.      Marland Kitchen amLODipine (NORVASC) 10 MG tablet Take 1 tablet (10 mg total) by mouth at bedtime.  30 tablet  1  . benazepril (LOTENSIN) 20 MG tablet Take 1 tablet (20 mg total) by mouth 2 (two) times daily.  60 tablet  3  . hydrochlorothiazide (HYDRODIURIL) 25 MG tablet Take 1 tablet (25 mg total) by mouth daily.  30 tablet  1  . labetalol (NORMODYNE) 300 MG tablet Take 1 tablet (300 mg total) by mouth 2 (two) times daily.  60 tablet  1   No facility-administered medications prior to visit.    PAST MEDICAL HISTORY: Past Medical History  Diagnosis Date  . Hypertension 06/26/2013    new dx  . Seasonal allergies     in summer and spring.  . Stroke 06/26/2013    "left me w/weakness on my left side" (08/27/2013)  . ICH (intracerebral hemorrhage) 06/26/2013    PAST SURGICAL HISTORY: Past Surgical History  Procedure Laterality Date  . No past surgeries      FAMILY HISTORY: Family History  Problem Relation Age of Onset  . Hypertension Father   . Diabetes Maternal Grandmother   . Stroke Paternal Grandfather     SOCIAL HISTORY: History   Social History  . Marital Status: Single  Spouse Name: N/A    Number of Children: 0  . Years of Education: College   Occupational History  .      n/a   Social History Main Topics  . Smoking status: Never Smoker   . Smokeless tobacco: Never Used  . Alcohol Use: 0.0 oz/week     Comment: 08/27/2013 "glass of wine very rarely"  . Drug Use: No  . Sexual Activity: No   Other Topics Concern  . Not on file   Social History Narrative   Patient lives at home with mother.   Caffeine Use: 1-2 cups daily     PHYSICAL EXAM  Filed Vitals:   09/06/13 1424  BP: 115/81    Pulse: 91  Height: 5\' 11"  (1.803 m)  Weight: 203 lb (92.08 kg)   Body mass index is 28.33 kg/(m^2). Physical Exam  Generalized: Well developed, in no acute distress, pleasant AA male Head: normocephalic and atraumatic. Oropharynx benign  Neck: Supple, no carotid bruits  Cardiac: Regular rate rhythm, no murmur  Musculoskeletal:  Left foot drop; Wearing left AFO brace  Neurological examination  Mentation: Alert oriented to time, place, history taking. Follows all commands speech and language fluent Cranial nerve II-XII:  Pupils were equal round reactive to light extraocular movements were full, visual field were full on confrontational test. Facial sensation and strength were normal. hearing was intact to finger rubbing bilaterally. Uvula tongue midline. head turning and shoulder shrug and were normal and symmetric.Tongue protrusion into cheek strength was normal. Motor: normal bulk and tone, full strength RUE, RLE, 4/5 strength in LUE, LLE, fine finger movements decreased on the right, no pronator drift. Right orbits left upper extremity.  Sensory: decreased to light touch on the left arm and leg. Coordination: finger-nose-finger, heel-to-shin normal bilaterally, no dysmetria Reflexes:  Deep tendon reflexes in the upper and lower extremities are present and symmetric.  Gait and Station: Rising up from seated position without assistance, wide based hemiplegic gait; ambulates with cane; able to perform tiptoe, and heel walking with some difficulty. Romberg negative.  DIAGNOSTIC DATA (LABS, IMAGING, TESTING) - I reviewed patient records, labs, notes, testing and imaging myself where available.  Lab Results  Component Value Date   WBC 5.0 08/28/2013   HGB 13.4 08/28/2013   HCT 38.9* 08/28/2013   MCV 89.0 08/28/2013   PLT 230 08/28/2013      Component Value Date/Time   NA 144 08/28/2013 0445   K 3.3* 08/28/2013 0445   CL 109 08/28/2013 0445   CO2 26 08/28/2013 0445   GLUCOSE 85 08/28/2013  0445   BUN 8 08/28/2013 0445   CREATININE 1.37* 08/28/2013 0445   CALCIUM 9.1 08/28/2013 0445   PROT 7.2 08/27/2013 0130   ALBUMIN 3.8 08/27/2013 0130   AST 83* 08/27/2013 0130   ALT 59* 08/27/2013 0130   ALKPHOS 66 08/27/2013 0130   BILITOT 0.8 08/27/2013 0130   GFRNONAA 69* 08/28/2013 0445   GFRAA 80* 08/28/2013 0445   Lab Results  Component Value Date   CHOL 132 06/27/2013   HDL 42 06/27/2013   LDLCALC 76 06/27/2013   TRIG 68 06/27/2013   CHOLHDL 3.1 06/27/2013   Lab Results  Component Value Date   HGBA1C 5.1 06/26/2013    ASSESSMENT AND PLAN Mr. Jake Kueker is a 28 y.o. male presenting with left hemiparesis, dysarthria, headache, nausea on 06/26/13.  Imaging confirmed a 3.2 x 1.9 cm right basal ganglia/sub insular acute intraparenchymal hematoma with local mass effect, no midline shift.  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 On no medications prior to admission.  Patient with resultant left hemiparesis and mild sensory deficit.  I discussed plan of care with patient and his mother, what to expect in the coming months and answered questions.  Over 50% of visit was stroke education and risk factor modification education. PLAN: Maintain strict control of hypertension with blood pressure goal below 140/90, and lipids with LDL cholesterol goal below 100 mg/dL.  Gabapentin, 300 mg capsule, start by taking only at bedtime.  It may make you sleepy.  If needed for nerve pain and tingling, you may take it every 6-8 hours. Tizanidine, 2 mg tablet, start by taking only at bedtime.  It may make you sleepy.  If needed for muscle spasticty, you may take it every 6-8 hours.  Followup in the future with me in 3 months.  Meds ordered this encounter  Medications  . gabapentin (NEURONTIN) 300 MG capsule    Sig: Take 1 capsule (300 mg total) by mouth 3 (three) times daily.    Dispense:  90 capsule    Refill:  11    Order Specific Question:  Supervising Provider    Answer:  Micki Riley [[id number]]  . tiZANidine (ZANAFLEX) 2 MG tablet    Sig: Take 1 tablet (2 mg total) by mouth every 8 (eight) hours as needed for muscle spasms.    Dispense:  90 tablet    Refill:  11    Order Specific Question:  Supervising Provider    Answer:  Micki Riley [[id number]]   Return in about 3 months (around 12/05/2013).  Ronal Fear, MSN, NP-C 09/06/2013, 4:41 PM [facility] [address] [place], [place] [address] [phone]  Note: This document was prepared with digital dictation and possible smart phrase technology. Any transcriptional errors that result from this process are unintentional. ,

## 2013-09-06 NOTE — Patient Instructions (Signed)
I am giving you 2 prescriptions for use if you need them.  Gabapentin, 300 mg capsule, start by taking only at bedtime.  It may make you sleepy.  If needed for nerve pain and tingling, you may take it every 6-8 hours.  Tizanidine, 2 mg tablet, start by taking only at bedtime.  It may make you sleepy.  If needed for muscle spasticty, you may take it every 6-8 hours.   Follow up in 3-4 months with NP or Sr. Sethi.

## 2013-09-10 ENCOUNTER — Ambulatory Visit: Payer: BC Managed Care – PPO

## 2013-09-10 ENCOUNTER — Encounter: Payer: BC Managed Care – PPO | Admitting: Occupational Therapy

## 2013-09-11 ENCOUNTER — Ambulatory Visit: Payer: BC Managed Care – PPO | Attending: Physical Medicine & Rehabilitation | Admitting: Physical Therapy

## 2013-09-11 DIAGNOSIS — I10 Essential (primary) hypertension: Secondary | ICD-10-CM | POA: Insufficient documentation

## 2013-09-11 DIAGNOSIS — M6281 Muscle weakness (generalized): Secondary | ICD-10-CM | POA: Insufficient documentation

## 2013-09-11 DIAGNOSIS — IMO0001 Reserved for inherently not codable concepts without codable children: Secondary | ICD-10-CM | POA: Insufficient documentation

## 2013-09-11 DIAGNOSIS — M629 Disorder of muscle, unspecified: Secondary | ICD-10-CM | POA: Insufficient documentation

## 2013-09-11 DIAGNOSIS — M2420 Disorder of ligament, unspecified site: Secondary | ICD-10-CM | POA: Insufficient documentation

## 2013-09-11 DIAGNOSIS — R269 Unspecified abnormalities of gait and mobility: Secondary | ICD-10-CM | POA: Insufficient documentation

## 2013-09-12 ENCOUNTER — Ambulatory Visit: Payer: BC Managed Care – PPO | Admitting: Occupational Therapy

## 2013-09-23 ENCOUNTER — Ambulatory Visit (INDEPENDENT_AMBULATORY_CARE_PROVIDER_SITE_OTHER): Payer: BC Managed Care – PPO | Admitting: Surgery

## 2013-09-24 ENCOUNTER — Ambulatory Visit: Payer: BC Managed Care – PPO | Admitting: Occupational Therapy

## 2013-09-24 ENCOUNTER — Ambulatory Visit: Payer: BC Managed Care – PPO | Admitting: Physical Therapy

## 2013-09-27 ENCOUNTER — Ambulatory Visit: Payer: BC Managed Care – PPO | Attending: Physical Medicine & Rehabilitation | Admitting: Physical Therapy

## 2013-09-27 DIAGNOSIS — M2420 Disorder of ligament, unspecified site: Secondary | ICD-10-CM | POA: Insufficient documentation

## 2013-09-27 DIAGNOSIS — IMO0001 Reserved for inherently not codable concepts without codable children: Secondary | ICD-10-CM | POA: Insufficient documentation

## 2013-09-27 DIAGNOSIS — M6281 Muscle weakness (generalized): Secondary | ICD-10-CM | POA: Insufficient documentation

## 2013-09-27 DIAGNOSIS — M629 Disorder of muscle, unspecified: Secondary | ICD-10-CM | POA: Insufficient documentation

## 2013-09-27 DIAGNOSIS — R269 Unspecified abnormalities of gait and mobility: Secondary | ICD-10-CM | POA: Insufficient documentation

## 2013-10-01 ENCOUNTER — Ambulatory Visit: Payer: BC Managed Care – PPO

## 2013-10-01 ENCOUNTER — Ambulatory Visit: Payer: BC Managed Care – PPO | Admitting: Occupational Therapy

## 2013-10-01 ENCOUNTER — Encounter: Payer: BC Managed Care – PPO | Admitting: Physical Medicine & Rehabilitation

## 2013-10-03 ENCOUNTER — Ambulatory Visit: Payer: BC Managed Care – PPO | Admitting: Occupational Therapy

## 2013-10-03 ENCOUNTER — Ambulatory Visit: Payer: BC Managed Care – PPO | Admitting: Physical Therapy

## 2013-10-07 ENCOUNTER — Ambulatory Visit: Payer: BC Managed Care – PPO | Admitting: Internal Medicine

## 2013-10-07 ENCOUNTER — Encounter: Payer: BC Managed Care – PPO | Admitting: Occupational Therapy

## 2013-10-07 ENCOUNTER — Ambulatory Visit: Payer: BC Managed Care – PPO | Admitting: Rehabilitative and Restorative Service Providers"

## 2013-10-08 ENCOUNTER — Ambulatory Visit: Payer: BC Managed Care – PPO | Admitting: Physical Therapy

## 2013-10-08 ENCOUNTER — Ambulatory Visit: Payer: BC Managed Care – PPO | Admitting: Occupational Therapy

## 2013-10-09 ENCOUNTER — Telehealth: Payer: Self-pay | Admitting: Physical Medicine & Rehabilitation

## 2013-10-09 NOTE — Telephone Encounter (Signed)
Work note printed.  Patient aware and will pick up.

## 2013-10-09 NOTE — Telephone Encounter (Signed)
He may return to work full time as tolerated during the day time only. Letter written

## 2013-10-09 NOTE — Telephone Encounter (Signed)
Patient's work is requesting more specific work orders.  They think he needs to be on a day schedule because his medications make him drowsy, so he is requesting it to be revised.  Currently, it just says "full-time, as tolerated".  Also, will he have to pay for a new letter?

## 2013-10-10 ENCOUNTER — Telehealth: Payer: Self-pay

## 2013-10-10 ENCOUNTER — Ambulatory Visit: Payer: BC Managed Care – PPO | Admitting: Occupational Therapy

## 2013-10-10 ENCOUNTER — Telehealth: Payer: Self-pay | Admitting: *Deleted

## 2013-10-10 ENCOUNTER — Ambulatory Visit: Payer: BC Managed Care – PPO | Admitting: Physical Therapy

## 2013-10-10 ENCOUNTER — Telehealth: Payer: Self-pay | Admitting: Internal Medicine

## 2013-10-10 NOTE — Telephone Encounter (Signed)
New problem   Pt need to speak to nurse concerning a note to have his work times switch cause of some medications causing him to be dizzy. Pt need note by 10/11/13. Please call pt.

## 2013-10-10 NOTE — Telephone Encounter (Signed)
Shared Paul Reeves's note with patient, he verbalized understanding

## 2013-10-10 NOTE — Telephone Encounter (Signed)
Patient called and said that on the letter that he requested for his employer he needs changed. His employer said it needs to say he is able to work 12:30pm-9:00pm and he needs to take add'l time off the phone, and also 2 to 3 add'l 5-10 minute breaks during the shift.

## 2013-10-10 NOTE — Telephone Encounter (Signed)
I spoke with pt & he states he has never been seen in our office. He has an appointment on 1/28 as a new pt with Dr. Delton SeeNelson. Pt is being followed by his neurologist. I have asked him to follow-up with  Him for his note for work Pt hung up on me Mylo Redebbie Ardythe Klute RN

## 2013-10-10 NOTE — Telephone Encounter (Signed)
It looks like in the encounters that Dr Melburn PopperZach Schwartz was last to alter his BP meds in in-patient rehab and we did not change them.  His office provided him a note yesterday.  He should follow up there if he needs the note changed.

## 2013-10-11 NOTE — Telephone Encounter (Signed)
Please add those elements and I will sign. thanks

## 2013-10-14 ENCOUNTER — Encounter: Payer: Self-pay | Admitting: Cardiology

## 2013-10-15 ENCOUNTER — Ambulatory Visit: Payer: BC Managed Care – PPO | Admitting: Physical Therapy

## 2013-10-15 ENCOUNTER — Ambulatory Visit: Payer: BC Managed Care – PPO | Admitting: Occupational Therapy

## 2013-10-17 ENCOUNTER — Ambulatory Visit: Payer: BC Managed Care – PPO | Admitting: Occupational Therapy

## 2013-10-17 ENCOUNTER — Ambulatory Visit: Payer: BC Managed Care – PPO | Admitting: Physical Therapy

## 2013-10-18 ENCOUNTER — Encounter: Payer: Self-pay | Admitting: *Deleted

## 2013-10-22 ENCOUNTER — Ambulatory Visit: Payer: BC Managed Care – PPO | Admitting: Physical Therapy

## 2013-10-22 ENCOUNTER — Encounter: Payer: BC Managed Care – PPO | Admitting: Occupational Therapy

## 2013-10-23 ENCOUNTER — Ambulatory Visit: Payer: BC Managed Care – PPO | Admitting: Cardiology

## 2013-10-23 ENCOUNTER — Ambulatory Visit (INDEPENDENT_AMBULATORY_CARE_PROVIDER_SITE_OTHER): Payer: BC Managed Care – PPO | Admitting: Surgery

## 2013-10-23 ENCOUNTER — Encounter: Payer: BC Managed Care – PPO | Admitting: Physical Medicine & Rehabilitation

## 2013-10-24 ENCOUNTER — Ambulatory Visit: Payer: BC Managed Care – PPO | Admitting: Physical Therapy

## 2013-10-24 ENCOUNTER — Ambulatory Visit: Payer: BC Managed Care – PPO | Admitting: Occupational Therapy

## 2013-10-25 ENCOUNTER — Ambulatory Visit: Payer: BC Managed Care – PPO | Admitting: Cardiology

## 2013-10-25 ENCOUNTER — Encounter (INDEPENDENT_AMBULATORY_CARE_PROVIDER_SITE_OTHER): Payer: Self-pay | Admitting: Surgery

## 2013-10-27 ENCOUNTER — Emergency Department (HOSPITAL_COMMUNITY): Payer: BC Managed Care – PPO

## 2013-10-27 ENCOUNTER — Emergency Department (HOSPITAL_COMMUNITY)
Admission: EM | Admit: 2013-10-27 | Discharge: 2013-10-27 | Disposition: A | Discharge status: Home / Self Care | Payer: BC Managed Care – PPO | Attending: Emergency Medicine | Admitting: Emergency Medicine

## 2013-10-27 ENCOUNTER — Encounter (HOSPITAL_COMMUNITY): Payer: Self-pay | Admitting: Emergency Medicine

## 2013-10-27 DIAGNOSIS — I10 Essential (primary) hypertension: Secondary | ICD-10-CM | POA: Insufficient documentation

## 2013-10-27 DIAGNOSIS — R269 Unspecified abnormalities of gait and mobility: Secondary | ICD-10-CM | POA: Insufficient documentation

## 2013-10-27 DIAGNOSIS — Z79899 Other long term (current) drug therapy: Secondary | ICD-10-CM | POA: Insufficient documentation

## 2013-10-27 DIAGNOSIS — Z8709 Personal history of other diseases of the respiratory system: Secondary | ICD-10-CM | POA: Insufficient documentation

## 2013-10-27 DIAGNOSIS — Z8673 Personal history of transient ischemic attack (TIA), and cerebral infarction without residual deficits: Secondary | ICD-10-CM | POA: Insufficient documentation

## 2013-10-27 DIAGNOSIS — R42 Dizziness and giddiness: Secondary | ICD-10-CM | POA: Insufficient documentation

## 2013-10-27 LAB — CBC WITH DIFFERENTIAL/PLATELET
BASOS ABS: 0 10*3/uL (ref 0.0–0.1)
BASOS PCT: 0 % (ref 0–1)
Eosinophils Absolute: 0.1 10*3/uL (ref 0.0–0.7)
Eosinophils Relative: 2 % (ref 0–5)
HEMATOCRIT: 40.7 % (ref 39.0–52.0)
HEMOGLOBIN: 14.2 g/dL (ref 13.0–17.0)
LYMPHS PCT: 26 % (ref 12–46)
Lymphs Abs: 1.6 10*3/uL (ref 0.7–4.0)
MCH: 31.2 pg (ref 26.0–34.0)
MCHC: 34.9 g/dL (ref 30.0–36.0)
MCV: 89.5 fL (ref 78.0–100.0)
MONOS PCT: 7 % (ref 3–12)
Monocytes Absolute: 0.4 10*3/uL (ref 0.1–1.0)
NEUTROS ABS: 4.2 10*3/uL (ref 1.7–7.7)
Neutrophils Relative %: 66 % (ref 43–77)
Platelets: 277 10*3/uL (ref 150–400)
RBC: 4.55 MIL/uL (ref 4.22–5.81)
RDW: 12.8 % (ref 11.5–15.5)
WBC: 6.4 10*3/uL (ref 4.0–10.5)

## 2013-10-27 LAB — BASIC METABOLIC PANEL
BUN: 17 mg/dL (ref 6–23)
CHLORIDE: 102 meq/L (ref 96–112)
CO2: 27 mEq/L (ref 19–32)
Calcium: 9.9 mg/dL (ref 8.4–10.5)
Creatinine, Ser: 1.62 mg/dL — ABNORMAL HIGH (ref 0.50–1.35)
GFR calc non Af Amer: 56 mL/min — ABNORMAL LOW (ref >90)
GFR, EST AFRICAN AMERICAN: 65 mL/min — AB (ref >90)
Glucose, Bld: 101 mg/dL — ABNORMAL HIGH (ref 70–99)
Potassium: 3.9 mEq/L (ref 3.7–5.3)
Sodium: 142 mEq/L (ref 137–147)

## 2013-10-27 MED ORDER — DIAZEPAM 5 MG PO TABS: 5.0000 mg | ORAL_TABLET | Freq: Two times a day (BID) | ORAL | Status: DC

## 2013-10-27 MED ORDER — DIAZEPAM 5 MG PO TABS
5.0000 mg | ORAL_TABLET | Freq: Once | ORAL | Status: AC
  Administered 2013-10-27: 5 mg via ORAL
  Filled 2013-10-27: qty 1

## 2013-10-27 MED ORDER — SODIUM CHLORIDE 0.9 % IV BOLUS (SEPSIS)
1000.0000 mL | Freq: Once | INTRAVENOUS | Status: AC
  Administered 2013-10-27: 1000 mL via INTRAVENOUS

## 2013-10-27 NOTE — ED Provider Notes (Signed)
CSN: [id number]     Arrival date & time 10/27/13  1607 History   First MD Initiated Contact with Patient 10/27/13 1610     Chief Complaint  Patient presents with  . Dizziness   (Consider location/radiation/quality/duration/timing/severity/associated sxs/prior Treatment) Patient is a 28 y.o. male presenting with dizziness. The history is provided by the patient.  Dizziness Quality:  Head spinning Severity:  Severe Onset quality:  Gradual Duration:  6 hours Timing:  Intermittent Progression:  Waxing and waning Chronicity:  Recurrent Context: bending over and head movement   Context: not with ear pain, not with eye movement, not with loss of consciousness and not when standing up   Relieved by:  Lying down and closing eyes Worsened by:  Movement and standing up Ineffective treatments:  None tried Associated symptoms: no blood in stool, no chest pain, no diarrhea, no headaches, no nausea, no palpitations, no shortness of breath and no vomiting   Risk factors: hx of stroke   Risk factors: no anemia, no hx of vertigo, no Meniere's disease and no new medications    28 year old male with a 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 Patient said it started about 6 hours ago it has gotten progressively worse. Patient states that it went away for time. After he laid down. Patient worse when standing feels that the world was spinning around him. Felt like he was good enough to go to work and at work was unable to walk due to the amount of spinning. Patient with no nausea or vomiting denies headache denies neck pain denies fevers chills. Denies change in vision. States he usually has weakness after he takes his blood pressure medications in the morning and is concerned that the dizziness may be secondary to this. Patient said he has been taking his blood pressure medications regularly he has had normal blood pressures over the past few months.   Past  Medical History  Diagnosis Date  . Hypertension 06/26/2013    new dx  . Seasonal allergies     in summer and spring.  . Stroke 06/26/2013    "left me w/weakness on my left side" (08/27/2013)  . ICH (intracerebral hemorrhage) 06/26/2013   Past Surgical History  Procedure Laterality Date  . No past surgeries     Family History  Problem Relation Age of Onset  . Hypertension Father   . Diabetes Maternal Grandmother   . Stroke Paternal Grandfather   . Hypertension      strong family history of difficult HTN   History  Substance Use Topics  . Smoking status: Never Smoker   . Smokeless tobacco: Never Used  . Alcohol Use: 0.0 oz/week     Comment: 08/27/2013 "glass of wine very rarely"    Review of Systems  Constitutional: Negative for fever and chills.  HENT: Negative for congestion and facial swelling.   Eyes: Negative for discharge and visual disturbance.  Respiratory: Negative for shortness of breath.   Cardiovascular: Negative for chest pain and palpitations.  Gastrointestinal: Negative for nausea, vomiting, abdominal pain, diarrhea and blood in stool.  Musculoskeletal: Negative for arthralgias and myalgias.  Skin: Negative for color change and rash.  Neurological: Positive for dizziness. Negative for tremors, syncope and headaches.  Psychiatric/Behavioral: Negative for confusion and dysphoric mood.    Allergies  Peanuts and Naproxen  Home Medications   Current Outpatient Rx  Name  Route  Sig  Dispense  Refill  . acetaminophen (TYLENOL) 325 MG tablet  Oral   Take 1-2 tablets (325-650 mg total) by mouth every 4 (four) hours as needed.         Marland Kitchen. amLODipine (NORVASC) 10 MG tablet   Oral   Take 1 tablet (10 mg total) by mouth at bedtime.   30 tablet   1   . benazepril (LOTENSIN) 20 MG tablet   Oral   Take 1 tablet (20 mg total) by mouth 2 (two) times daily.   60 tablet   3   . gabapentin (NEURONTIN) 300 MG capsule   Oral   Take 300 mg by mouth at bedtime.          . hydrochlorothiazide (HYDRODIURIL) 25 MG tablet   Oral   Take 1 tablet (25 mg total) by mouth daily.   30 tablet   1   . labetalol (NORMODYNE) 300 MG tablet   Oral   Take 1 tablet (300 mg total) by mouth 2 (two) times daily.   60 tablet   1   . diazepam (VALIUM) 5 MG tablet   Oral   Take 1 tablet (5 mg total) by mouth 2 (two) times daily.   5 tablet   0    BP 143/83  Pulse 66  Temp(Src) 98.9 F (37.2 C) (Oral)  Resp 21  SpO2 100% Physical Exam  Constitutional: He is oriented to person, place, and time. He appears well-developed and well-nourished.  HENT:  Head: Normocephalic and atraumatic.  Eyes: EOM are normal. Pupils are equal, round, and reactive to light.  Neck: Normal range of motion. Neck supple. No JVD present.  Cardiovascular: Normal rate and regular rhythm.  Exam reveals no gallop and no friction rub.   No murmur heard. Pulmonary/Chest: No respiratory distress. He has no wheezes.  Abdominal: He exhibits no distension. There is no rebound and no guarding.  Musculoskeletal: Normal range of motion.  Neurological: He is alert and oriented to person, place, and time. He has normal strength. No cranial nerve deficit or sensory deficit. He displays a negative Romberg sign. Gait abnormal. GCS eye subscore is 4. GCS verbal subscore is 5. GCS motor subscore is 6. He displays no Babinski's sign on the right side. He displays no Babinski's sign on the left side.  Reflex Scores:      Tricep reflexes are 2+ on the right side and 2+ on the left side.      Bicep reflexes are 2+ on the right side and 2+ on the left side.      Brachioradialis reflexes are 2+ on the right side and 2+ on the left side.      Patellar reflexes are 2+ on the right side and 2+ on the left side.      Achilles reflexes are 2+ on the right side and 2+ on the left side. Mild ataxia with LUE, + skew, horizontal left sided nystagmus, head impulse test with saccade to the left. Gait mildly wide, with  left gluteal weakness unchanged per patient  Skin: No rash noted. No pallor.  Psychiatric: He has a normal mood and affect. His behavior is normal.    ED Course  Procedures (including critical care time) Labs Review Labs Reviewed  BASIC METABOLIC PANEL - Abnormal; Notable for the following:    Glucose, Bld 101 (*)    Creatinine, Ser 1.62 (*)    GFR calc non Af Amer 56 (*)    GFR calc Af Amer 65 (*)    All other components within normal limits  CBC WITH DIFFERENTIAL   Imaging Review Mr Brain Wo Contrast  10/27/2013   CLINICAL DATA:  Dizziness  EXAM: MRI HEAD WITHOUT CONTRAST  TECHNIQUE: Multiplanar, multiecho pulse sequences of the brain and surrounding structures were obtained without intravenous contrast.  COMPARISON:  CT head 08/27/2013  FINDINGS: Chronic hemorrhage in the posterior right external capsule as noted on the prior CT. No evidence of acute hemorrhage. No acute infarct.  Ventricle size is normal. No shift of the midline structures. Negative for demyelinating disease. Brainstem and cerebellum are intact. Negative for mass lesion.  IMPRESSION: Chronic hemorrhage right posterior external capsule. No acute intracranial abnormality   Electronically Signed   By: Marlan Palau M.D.   On: 10/27/2013 18:44    EKG Interpretation    Date/Time:  Sunday October 27 2013 16:14:20 EST Ventricular Rate:  71 PR Interval:  167 QRS Duration: 81 QT Interval:  380 QTC Calculation: 413 R Axis:   59 Text Interpretation:  Sinus rhythm Nonspecific T abnormalities, lateral leads No significant change since last tracing Confirmed by JACUBOWITZ  MD, SAM ([id number]) on 10/27/2013 4:24:18 PM            MDM   1. Vertigo     27  year old male with vertigo. Negative MRI brain. Patient's symptoms resolved with Valium.   I have discussed the diagnosis/risks/treatment options with the patient and family and believe the pt to be eligible for discharge home to follow-up with PCP. We also discussed  returning to the ED immediately if new or worsening sx occur. We discussed the sx which are most concerning (e.g., syncope, inability to walk) that necessitate immediate return. Medications administered to the patient during their visit and any new prescriptions provided to the patient are listed below.  Medications given during this visit Medications  diazepam (VALIUM) tablet 5 mg (5 mg Oral Given 10/27/13 1648)  sodium chloride 0.9 % bolus 1,000 mL (0 mLs Intravenous Stopped 10/27/13 1739)    Discharge Medication List as of 10/27/2013  7:04 PM    START taking these medications   Details  diazepam (VALIUM) 5 MG tablet Take 1 tablet (5 mg total) by mouth 2 (two) times daily., Starting 10/27/2013, Until Discontinued, Print           Melene Plan, MD 10/27/13 [phone]

## 2013-10-27 NOTE — ED Notes (Signed)
Patient transported to MRI 

## 2013-10-27 NOTE — ED Notes (Signed)
Pt arrives [facility] from work. Pt began feeling dizzy around 1030 this AM. Layed down, symptoms got better. Felt well enough to drive. Dizziness started again around 1445. Worked called EMS.

## 2013-10-27 NOTE — Discharge Instructions (Signed)
Dizziness ° Dizziness means you feel unsteady or lightheaded. You might feel like you are going to pass out (faint). °HOME CARE  °· Drink enough fluids to keep your pee (urine) clear or pale yellow. °· Take your medicines exactly as told by your doctor. If you take blood pressure medicine, always stand up slowly from the lying or sitting position. Hold on to something to steady yourself. °· If you need to stand in one place for a long time, move your legs often. Tighten and relax your leg muscles. °· Have someone stay with you until you feel okay. °· Do not drive or use heavy machinery if you feel dizzy. °· Do not drink alcohol. °GET HELP RIGHT AWAY IF:  °· You feel dizzy or lightheaded and it gets worse. °· You feel sick to your stomach (nauseous), or you throw up (vomit). °· You have trouble talking or walking. °· You feel weak or have trouble using your arms, hands, or legs. °· You cannot think clearly or have trouble forming sentences. °· You have chest pain, belly (abdominal) pain, sweating, or you are short of breath. °· Your vision changes. °· You are bleeding. °· You have problems from your medicine that seem to be getting worse. °MAKE SURE YOU:  °· Understand these instructions. °· Will watch your condition. °· Will get help right away if you are not doing well or get worse. °Document Released: 09/01/2011 Document Revised: 12/05/2011 Document Reviewed: 09/01/2011 °Exit[employer]® Patient Information ©2014 [employer]. ° °

## 2013-10-27 NOTE — ED Notes (Signed)
MD at bedside. Dr. Adela LankFloyd.

## 2013-10-27 NOTE — ED Provider Notes (Signed)
Complains of dizziness i.e. sensation of room spinning onset 10:30 AM today symptoms worse with moving his head improved with remaining still. He is asymptomatic presently. MRI brain ordered in light of patient's history of prior stroke  Paul SouSam Shivansh Hardaway, MD 10/27/13 [phone]

## 2013-10-27 NOTE — ED Notes (Signed)
First contact with patient. Pt ambulates without distress.

## 2013-10-28 NOTE — ED Provider Notes (Signed)
I have personally seen and examined the patient.  I have discussed the plan of care with the resident.  I have reviewed the documentation on PMH/FH/Soc. History.  I have reviewed the documentation of the resident and agree.  Charistopher Rumble, MD 10/28/13 0022 

## 2013-10-29 ENCOUNTER — Telehealth: Payer: Self-pay | Admitting: Cardiology

## 2013-10-29 ENCOUNTER — Telehealth: Payer: Self-pay

## 2013-10-29 NOTE — Telephone Encounter (Signed)
New Message  Pt called states that he is experiencing serve dizziness and drowsiness.. Cold hands and feet// Made appt for 02/09 at 10:45am

## 2013-10-29 NOTE — Telephone Encounter (Signed)
Tried to contact patient regarding his disability forms.  Dr Riley KillSwartz was under the understanding that the patient had gone back to work full-time.  He is unsure why he is filling out long term disability forms.  [facility] is reached.

## 2013-10-29 NOTE — Telephone Encounter (Signed)
This is in regard to letter written for patient.   In body of letter patients last name was spelled wrong.  Unable to correct name after letter was printed.

## 2013-10-31 ENCOUNTER — Encounter: Payer: Self-pay | Admitting: Cardiology

## 2013-11-04 ENCOUNTER — Encounter: Payer: Self-pay | Admitting: Cardiology

## 2013-11-04 ENCOUNTER — Ambulatory Visit (INDEPENDENT_AMBULATORY_CARE_PROVIDER_SITE_OTHER): Payer: BC Managed Care – PPO | Admitting: Cardiology

## 2013-11-04 VITALS — BP 130/98 | HR 64 | Ht 71.0 in | Wt 194.0 lb

## 2013-11-04 DIAGNOSIS — I639 Cerebral infarction, unspecified: Secondary | ICD-10-CM

## 2013-11-04 DIAGNOSIS — Z789 Other specified health status: Secondary | ICD-10-CM

## 2013-11-04 DIAGNOSIS — Z9189 Other specified personal risk factors, not elsewhere classified: Secondary | ICD-10-CM

## 2013-11-04 DIAGNOSIS — R42 Dizziness and giddiness: Secondary | ICD-10-CM

## 2013-11-04 DIAGNOSIS — I10 Essential (primary) hypertension: Secondary | ICD-10-CM

## 2013-11-04 DIAGNOSIS — I6350 Cerebral infarction due to unspecified occlusion or stenosis of unspecified cerebral artery: Secondary | ICD-10-CM

## 2013-11-04 DIAGNOSIS — K8020 Calculus of gallbladder without cholecystitis without obstruction: Secondary | ICD-10-CM

## 2013-11-04 DIAGNOSIS — I619 Nontraumatic intracerebral hemorrhage, unspecified: Secondary | ICD-10-CM

## 2013-11-04 MED ORDER — LOSARTAN POTASSIUM 50 MG PO TABS: 50.0000 mg | ORAL_TABLET | Freq: Every day | ORAL | Status: DC

## 2013-11-04 NOTE — Assessment & Plan Note (Signed)
The patient's left-sided weakness is continuing to recover. It is related to an intercerebral bleed from hypertension. He is following with neurology.

## 2013-11-04 NOTE — Assessment & Plan Note (Signed)
He had some recent vertigo. This is better today.

## 2013-11-04 NOTE — Progress Notes (Signed)
HPI  The patient is seen today as a new patient to help with the ongoing assessment and treatment of his hypertension. It seems that he was supposedly scheduled to see another one of our cardiologist today. That appointment was not clear and I agreed to add him to my schedule to help. As part of the evaluation today I have reviewed the patient's hospital records from December, 2014. I have reviewed neurology office notes since that time. I have reviewed information from [place] [facility]. I have updated this medical record.   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 It was felt to be due to hypertensive disease. He has been recovering and there has been careful attention paid to his blood pressure. From review of the labs in the hospital to screening urine to rule out pheochromocytoma were mostly normal. There is one elevated dopamine level. Significant of this is not clear to me at this time. The patient has not had renal artery Dopplers that I am aware of. Overall his blood pressure has been relatively well controlled since being at home. He was seen in the emergency room recently for dizziness. He was felt to be vertigo. He brings in blood pressure recordings which are usually in the range of 1:30 to 140/90. He says that at times at work however his pressure is lower. He goes to work at 2:00 in the afternoon until 9:00 at night.  Patient also mentions a persistent dry cough.  Patient mentions that he has cold hands and cold feet from time to time.  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 This was not mentioned in the hospital evaluation that I am aware of.    Allergies  Allergen Reactions  . Peanuts [Peanut Oil] Anaphylaxis  . Naproxen Rash    Current Outpatient Prescriptions  Medication Sig Dispense Refill  . acetaminophen (TYLENOL) 325 MG tablet Take 1-2 tablets (325-650 mg total) by mouth every 4 (four) hours as needed.        Marland Kitchen amLODipine (NORVASC) 10 MG tablet Take 1 tablet (10 mg total) by mouth at bedtime.  30 tablet  1  . diazepam (VALIUM) 5 MG tablet Take 1 tablet (5 mg total) by mouth 2 (two) times daily.  5 tablet  0  . gabapentin (NEURONTIN) 300 MG capsule Take 300 mg by mouth at bedtime.      . hydrochlorothiazide (HYDRODIURIL) 25 MG tablet Take 1 tablet (25 mg total) by mouth daily.  30 tablet  1  . labetalol (NORMODYNE) 300 MG tablet Take 1 tablet (300 mg total) by mouth 2 (two) times daily.  60 tablet  1  . tiZANidine (ZANAFLEX) 2 MG tablet Take 2 mg by mouth every 8 (eight) hours as needed.       Marland Kitchen losartan (COZAAR) 50 MG tablet Take 1 tablet (50 mg total) by mouth daily.  30 tablet  6   No current facility-administered medications for this visit.    History   Social History  . Marital Status: Single    Spouse Name: N/A    Number of Children: 0  . Years of Education: College   Occupational History  .      n/a   Social History Main Topics  . Smoking status: Never Smoker   . Smokeless tobacco: Never Used  . Alcohol Use: 0.0 oz/week     Comment: 08/27/2013 "glass of wine very rarely"  . Drug Use: No  .  Sexual Activity: No   Other Topics Concern  . Not on file   Social History Narrative   Patient lives at home with mother.   Caffeine Use: 1-2 cups daily    Family History  Problem Relation Age of Onset  . Hypertension Father   . Diabetes Maternal Grandmother   . Stroke Paternal Grandfather   . Hypertension      strong family history of difficult HTN    Past Medical History  Diagnosis Date  . Hypertension 06/26/2013    new dx  . Seasonal allergies     in summer and spring.  . Stroke 06/26/2013    "left me w/weakness on my left side" (08/27/2013)  . 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 Vertigo   . Cholelithiasis     Past Surgical History  Procedure Laterality Date  . No past surgeries      Patient Active Problem List   Diagnosis Date Noted  . Vertigo   .  Cholelithiasis   . Hypertension 06/26/2013  . Stroke 06/26/2013  . ICH (intracerebral hemorrhage) 06/26/2013    ROS   Patient denies fever, chills, headache, sweats, rash, change in vision, change in hearing, chest pain, cough, nausea or vomiting, urinary symptoms. All other systems are reviewed and are negative.  PHYSICAL EXAM  Patient is oriented to person time and place. Affect is normal. There is no jugulovenous distention. Lungs are clear. Respiratory effort is nonlabored. Cardiac exam reveals S1 and S2. There no clicks or significant murmurs. Abdomen is soft. There is no peripheral edema. The patient has thin elongated fingers. He is relatively tall. There are no skin rashes.  Filed Vitals:   11/04/13 1054  BP: 130/98  Pulse: 64  Height: 5\' 11"  (1.803 m)  Weight: 194 lb (87.998 kg)   I have reviewed the recent EKGs from his hospitalization. There are no diagnostic abnormalities.  ASSESSMENT & PLAN

## 2013-11-04 NOTE — Assessment & Plan Note (Signed)
He had some abdominal pain and his study showed cholelithiasis. He did not have active cholecystitis.

## 2013-11-04 NOTE — Patient Instructions (Addendum)
Your physician has recommended you make the following change in your medication:   1. Stop Benazepril.  2. Start Losartan 50 mg 1 tablet by mouth daily.   Your physician recommends that you schedule a follow-up appointment in 3-4 weeks with Dr. Myrtis SerKatz.  Your physician recommends that you return for lab work on 11/06/13 for TSH.

## 2013-11-04 NOTE — Assessment & Plan Note (Addendum)
Patient's blood pressure is relatively well controlled. Because he has a dry cough I will switch his benazepril to losartan. I will continue to work with him concerning when he takes his meds during the day. I'm hesitant to make any further changes today. I suspect that the mild elevation of dopamine in his urine checks is not of significance. This will be researched further. I will consider whether we should proceed with renal artery ultrasound. However the key issue is to keep his blood pressure controlled at this time.  He has cold hands and cold feet. Thyroid functions were never checked. TSH will be done at some point. 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 He did not have a two-dimensional echo. I will consider this when I see him at the time of his next visit.  As part of today's evaluation I spent greater than 60 minutes with his overall care. I had to add him to my schedule. I reviewed the old records extensively to try to understand his complete workup of his hypertension and evaluation of his stroke. I reviewed his primary care records. I spoke with him at length about his symptoms and his history and his work.

## 2013-11-06 ENCOUNTER — Other Ambulatory Visit: Payer: BC Managed Care – PPO

## 2013-11-29 ENCOUNTER — Encounter: Payer: Self-pay | Admitting: Cardiology

## 2013-11-29 ENCOUNTER — Encounter: Payer: Self-pay | Admitting: Physical Medicine & Rehabilitation

## 2013-11-29 ENCOUNTER — Encounter
Payer: BC Managed Care – PPO | Attending: Physical Medicine & Rehabilitation | Admitting: Physical Medicine & Rehabilitation

## 2013-11-29 ENCOUNTER — Ambulatory Visit (INDEPENDENT_AMBULATORY_CARE_PROVIDER_SITE_OTHER): Payer: BC Managed Care – PPO | Admitting: Cardiology

## 2013-11-29 VITALS — BP 137/87 | HR 79 | Ht 71.0 in | Wt 188.4 lb

## 2013-11-29 VITALS — BP 162/102 | HR 71 | Resp 14 | Ht 71.0 in | Wt 189.0 lb

## 2013-11-29 DIAGNOSIS — I639 Cerebral infarction, unspecified: Secondary | ICD-10-CM

## 2013-11-29 DIAGNOSIS — G8190 Hemiplegia, unspecified affecting unspecified side: Secondary | ICD-10-CM | POA: Insufficient documentation

## 2013-11-29 DIAGNOSIS — I6350 Cerebral infarction due to unspecified occlusion or stenosis of unspecified cerebral artery: Secondary | ICD-10-CM

## 2013-11-29 DIAGNOSIS — IMO0001 Reserved for inherently not codable concepts without codable children: Secondary | ICD-10-CM

## 2013-11-29 DIAGNOSIS — I10 Essential (primary) hypertension: Secondary | ICD-10-CM | POA: Insufficient documentation

## 2013-11-29 DIAGNOSIS — I619 Nontraumatic intracerebral hemorrhage, unspecified: Secondary | ICD-10-CM

## 2013-11-29 DIAGNOSIS — Q8740 Marfan's syndrome, unspecified: Secondary | ICD-10-CM

## 2013-11-29 DIAGNOSIS — Z8673 Personal history of transient ischemic attack (TIA), and cerebral infarction without residual deficits: Secondary | ICD-10-CM | POA: Insufficient documentation

## 2013-11-29 DIAGNOSIS — M7918 Myalgia, other site: Secondary | ICD-10-CM | POA: Insufficient documentation

## 2013-11-29 LAB — TSH: TSH: 1.71 u[IU]/mL (ref 0.35–5.50)

## 2013-11-29 MED ORDER — METHOCARBAMOL 500 MG PO TABS: 500.0000 mg | ORAL_TABLET | Freq: Four times a day (QID) | ORAL | Status: DC | PRN

## 2013-11-29 NOTE — Assessment & Plan Note (Addendum)
Blood pressure is well controlled on his current meds. I will not be proceeding with a renal artery Doppler at this time. Continue same medications.  Because he has some marfanoid characteristics, two-dimensional echo will be done to be sure that his aorta is of normal size.

## 2013-11-29 NOTE — Patient Instructions (Signed)
Lab work today  TSH    Your physician has requested that you have an echocardiogram. Echocardiography is a painless test that uses sound waves to create images of your heart. It provides your doctor with information about the size and shape of your heart and how well your heart's chambers and valves are working. This procedure takes approximately one hour. There are no restrictions for this procedure.     Your physician recommends that you schedule a follow-up appointment in: 3 months

## 2013-11-29 NOTE — Patient Instructions (Signed)
WORK ON REGULAR STRETCHING OF YOUR ABDOMINAL MUSCLES.  USE GOOD GAIT TECHNIQUE  REMEMBER TO TRY ICE AND HEAT AS WELL FOR PAIN.  PLEASE CALL ME WITH ANY PROBLEMS OR QUESTIONS [phone]).

## 2013-11-29 NOTE — Progress Notes (Signed)
Subjective:    Patient ID: Paul Reeves, male    DOB: 1986/02/16, 28 y.o.   MRN: [id number]  HPI  Paul Reeves is back regarding his ICH. He has been back to work for about a month. He states things have been going fairly well. His feedback from his supervisors has been good. He is driving.   He is complaining of abdominal pain/cramping. He had an abdominal u/s and liver nuclear scan which was unremarkable. He follows up with general surgery on Monday. His pain is typically in the LUQ and bothers him during the day time when he moves around.   He requests a handicapped parking pass today.       Pain Inventory Average Pain 2 Pain Right Now 3 My pain is intermittent and aching  In the last 24 hours, has pain interfered with the following? General activity 1 Relation with others 1 Enjoyment of life 1 What TIME of day is your pain at its worst? daytime Sleep (in general) Good  Pain is worse with: na Pain improves with: na Relief from Meds: 4  Mobility walk without assistance ability to climb steps?  yes do you drive?  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  yes CT/MRI  Physicians involved in your care general surgery   Family History  Problem Relation Age of Onset  . Hypertension Father   . Diabetes Maternal Grandmother   . Stroke Paternal Grandfather   . Hypertension      strong family history of difficult HTN   History   Social History  . Marital Status: Single    Spouse Name: N/A    Number of Children: 0  . Years of Education: College   Occupational History  .      n/a   Social History Main Topics  . Smoking status: Never Smoker   . Smokeless tobacco: Never Used  . Alcohol Use: 0.0 oz/week     Comment: 08/27/2013 "glass of wine very rarely"  . Drug Use: No  . Sexual Activity: No   Other Topics  Concern  . None   Social History Narrative   Patient lives at home with mother.   Caffeine Use: 1-2 cups daily   Past Surgical History  Procedure Laterality Date  . No past surgeries     Past Medical History  Diagnosis Date  . Hypertension 06/26/2013    new dx  . Seasonal allergies     in summer and spring.  . Stroke 06/26/2013    "left me w/weakness on my left side" (08/27/2013)  . 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 Vertigo   . Cholelithiasis    BP 162/102  Pulse 71  Resp 14  Ht 5\' 11"  (1.803 m)  Wt 189 lb (85.73 kg)  BMI 26.37 kg/m2  SpO2 100%  Opioid Risk Score:   Fall Risk Score: Low Fall Risk (0-5 points) (pt educated on fall risk, pt declined brochure)    Review of Systems  Gastrointestinal: Positive for abdominal pain.  All other systems reviewed and are negative.       Objective:   Physical Exam  Constitutional: He is oriented to person, place, and time. He appears well-developed and well-nourished. He lost weight.  HENT:  Head: Normocephalic and atraumatic.  Eyes: Conjunctivae are normal. Pupils are equal, round, and reactive to light.  Neck:  Normal range of motion. Neck supple.  Cardiovascular: Normal rate and regular rhythm. No murmur  Pulmonary/Chest: Effort normal and breath sounds normal. No respiratory distress. He has no wheezes.  Abdominal: Soft. Bowel sounds are normal. He exhibits no distension..  Musculoskeletal: He exhibits no edema. Has tightness along the left obliques. Area tender and taut with palpation.  Neurological: He is alert and oriented to person, place, and time.  Speech clear. Left facial weakness persists but better. Left hemiparesis with sensory deficits. LUE is 4+ deltoid, 4 to 4+/5 bic, tric, HI. LLE is 4+/5 HF, KE; ADF 1-2, APF 2-3. Sensation 1/2 left arm and leg but better. Mild left facial weakness, visual fields intact. He walks with a circumduction pattern on the left. He did have some difficulty when he changed  direction. CN was completely intact  Skin: Skin is warm and dry.  Psychiatric: He has a normal mood and affect. His behavior is normal. Judgment and thought content normal. He is alert and appropriate    Assessment/Plan:  1. 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 Pain Management: left upper quadrant pain, GI work up negative  -appears musculoskeletal/myofascial  -discussed appropriate stretching, heat, ice  -add skelaxin for prn spasms  -discussed appropriate gait technique  -consider TPI if resistant 3. Driving--gave permission to drive after trials with family.  4. . Malignant HTN: bp normotensive for the most part.  5. Follow up with me in about 2 months. Consider TPI's at this time. 30 minutes of face to face patient care time were spent during this visit. All questions were encouraged and answered.   Handicapped parking pass form was completed today

## 2013-11-29 NOTE — Progress Notes (Signed)
HPI  Patient is seen today to followup hypertension. I saw him as a new patient on November 04, 2013. He had had an intercerebral bleed when he had severe hypertension. When I saw him his pressure was under reasonable control. He did have a dry cough. His ACE inhibitor was changed to an ARB in his cough is gone. He says his pressure is under good control at home. Pressures control today. I had hoped to obtain a TSH after his last visit but it has not been done. This will be arranged. I also mentioned that he might have some marfanoid features. She will now be time to do a two-dimensional echo. Overall he is feeling well.  Allergies  Allergen Reactions  . Peanuts [Peanut Oil] Anaphylaxis  . Naproxen Rash    Current Outpatient Prescriptions  Medication Sig Dispense Refill  . acetaminophen (TYLENOL) 325 MG tablet Take 1-2 tablets (325-650 mg total) by mouth every 4 (four) hours as needed.      Marland Kitchen. amLODipine (NORVASC) 10 MG tablet Take 1 tablet (10 mg total) by mouth at bedtime.  30 tablet  1  . diazepam (VALIUM) 5 MG tablet Take 1 tablet (5 mg total) by mouth 2 (two) times daily.  5 tablet  0  . gabapentin (NEURONTIN) 300 MG capsule Take 300 mg by mouth at bedtime.      . hydrochlorothiazide (HYDRODIURIL) 25 MG tablet Take 1 tablet (25 mg total) by mouth daily.  30 tablet  1  . labetalol (NORMODYNE) 300 MG tablet Take 1 tablet (300 mg total) by mouth 2 (two) times daily.  60 tablet  1  . losartan (COZAAR) 50 MG tablet Take 1 tablet (50 mg total) by mouth daily.  30 tablet  6  . methocarbamol (ROBAXIN) 500 MG tablet Take 1 tablet (500 mg total) by mouth every 6 (six) hours as needed for muscle spasms.  60 tablet  3  . tiZANidine (ZANAFLEX) 2 MG tablet Take 2 mg by mouth every 8 (eight) hours as needed.        No current facility-administered medications for this visit.    History   Social History  . Marital Status: Single    Spouse Name: N/A    Number of Children: 0  . Years of  Education: College   Occupational History  .      n/a   Social History Main Topics  . Smoking status: Never Smoker   . Smokeless tobacco: Never Used  . Alcohol Use: 0.0 oz/week     Comment: 08/27/2013 "glass of wine very rarely"  . Drug Use: No  . Sexual Activity: No   Other Topics Concern  . Not on file   Social History Narrative   Patient lives at home with mother.   Caffeine Use: 1-2 cups daily    Family History  Problem Relation Age of Onset  . Hypertension Father   . Diabetes Maternal Grandmother   . Stroke Paternal Grandfather   . Hypertension      strong family history of difficult HTN    Past Medical History  Diagnosis Date  . Hypertension 06/26/2013    new dx  . Seasonal allergies     in summer and spring.  . Stroke 06/26/2013    "left me w/weakness on my left side" (08/27/2013)  . 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 Vertigo   . Cholelithiasis     Past Surgical History  Procedure Laterality Date  . No past surgeries  Patient Active Problem List   Diagnosis Date Noted  . Myofascial pain on left side 11/29/2013  . Vertigo   . Cholelithiasis   . Hypertension 06/26/2013  . Stroke 06/26/2013  . ICH (intracerebral hemorrhage) 06/26/2013    ROS   Patient denies fever, chills, headache, sweats, rash, change in vision, change in hearing, chest pain, cough, nausea or vomiting, urinary symptoms. All other systems are reviewed and are negative.  PHYSICAL EXAM  Patient is oriented to person time and place. Affect is normal. There is no jugulovenous distention. Lungs are clear. Respiratory effort is nonlabored. Cardiac exam reveals S1 and S2. There no clicks or significant murmurs. The abdomen is soft. There is no peripheral edema.  Filed Vitals:   11/29/13 1436  BP: 137/87  Pulse: 79  Height: 5\' 11"  (1.803 m)  Weight: 188 lb 6.4 oz (85.458 kg)     ASSESSMENT & PLAN

## 2013-11-29 NOTE — Assessment & Plan Note (Signed)
He continues to improve from his neurologic event.

## 2013-12-02 ENCOUNTER — Ambulatory Visit (INDEPENDENT_AMBULATORY_CARE_PROVIDER_SITE_OTHER): Payer: BC Managed Care – PPO | Admitting: Surgery

## 2013-12-02 ENCOUNTER — Encounter (INDEPENDENT_AMBULATORY_CARE_PROVIDER_SITE_OTHER): Payer: Self-pay | Admitting: Surgery

## 2013-12-02 VITALS — BP 118/88 | HR 78 | Temp 97.9°F | Resp 14 | Ht 71.0 in | Wt 185.2 lb

## 2013-12-02 DIAGNOSIS — K8020 Calculus of gallbladder without cholecystitis without obstruction: Secondary | ICD-10-CM

## 2013-12-02 DIAGNOSIS — IMO0001 Reserved for inherently not codable concepts without codable children: Secondary | ICD-10-CM

## 2013-12-02 DIAGNOSIS — M7918 Myalgia, other site: Secondary | ICD-10-CM

## 2013-12-02 NOTE — Patient Instructions (Signed)
He had gallstones.  I think there incidental.  Your story & pain is not consistent with a gallbladder attack.  Cholelithiasis Cholelithiasis (also called gallstones) is a form of gallbladder disease in which gallstones form in your gallbladder. The gallbladder is an organ that stores bile made in the liver, which helps digest fats. Gallstones begin as small crystals and slowly grow into stones. Gallstone pain occurs when the gallbladder spasms and a gallstone is blocking the duct. Pain can also occur when a stone passes out of the duct.  RISK FACTORS  Being male.   Having multiple pregnancies. Health care providers sometimes advise removing diseased gallbladders before future pregnancies.   Being obese.  Eating a diet heavy in fried foods and fat.   Being older than 60 years and increasing age.   Prolonged use of medicines containing male hormones.   Having diabetes mellitus.   Rapidly losing weight.   Having a family history of gallstones (heredity).  SYMPTOMS  Nausea.   Vomiting.  Abdominal pain.   Yellowing of the skin (jaundice).   Sudden pain. It may persist from several minutes to several hours.  Fever.   Tenderness to the touch. In some cases, when gallstones do not move into the bile duct, people have no pain or symptoms. These are called "silent" gallstones.  TREATMENT Silent gallstones do not need treatment. In severe cases, emergency surgery may be required. Options for treatment include:  Surgery to remove the gallbladder. This is the most common treatment.  Medicines. These do not always work and may take 6 12 months or more to work.  Shock wave treatment (extracorporeal biliary lithotripsy). In this treatment an ultrasound machine sends shock waves to the gallbladder to break gallstones into smaller pieces that can pass into the intestines or be dissolved by medicine. HOME CARE INSTRUCTIONS   Only take over-the-counter or prescription  medicines for pain, discomfort, or fever as directed by your health care provider.   Follow a low-fat diet until seen again by your health care provider. Fat causes the gallbladder to contract, which can result in pain.   Follow up with your health care provider as directed. Attacks are almost always recurrent and surgery is usually required for permanent treatment.  SEEK IMMEDIATE MEDICAL CARE IF:   Your pain increases and is not controlled by medicines.   You have a fever or persistent symptoms for more than 2 3 days.   You have a fever and your symptoms suddenly get worse.   You have persistent nausea and vomiting.  MAKE SURE YOU:   Understand these instructions.  Will watch your condition.  Will get help right away if you are not doing well or get worse. Document Released: 09/08/2005 Document Revised: 05/15/2013 Document Reviewed: 03/06/2013 [facility] Patient Information 2014 [place], [place].  Muscle Strain A muscle strain is an injury that occurs when a muscle is stretched beyond its normal length. Usually a small number of muscle fibers are torn when this happens. Muscle strain is rated in degrees. First-degree strains have the least amount of muscle fiber tearing and pain. Second-degree and third-degree strains have increasingly more tearing and pain.  Usually, recovery from muscle strain takes 1 2 weeks. Complete healing takes 5 6 weeks.  CAUSES  Muscle strain happens when a sudden, violent force placed on a muscle stretches it too far. This may occur with lifting, sports, or a fall.  RISK FACTORS Muscle strain is especially common in athletes.  SIGNS AND SYMPTOMS At the  site of the muscle strain, there may be:  Pain.  Bruising.  Swelling.  Difficulty using the muscle due to pain or lack of normal function. DIAGNOSIS  Your health care provider will perform a physical exam and ask about your medical history. TREATMENT  Often, the best treatment for a muscle  strain is resting, icing, and applying cold compresses to the injured area.  HOME CARE INSTRUCTIONS   Use the PRICE method of treatment to promote muscle healing during the first 2 3 days after your injury. The PRICE method involves:  Protecting the muscle from being injured again.  Restricting your activity and resting the injured body part.  Icing your injury. To do this, put ice in a plastic bag. Place a towel between your skin and the bag. Then, apply the ice and leave it on from 15 20 minutes each hour. After the third day, switch to moist heat packs.  Apply compression to the injured area with a splint or elastic bandage. Be careful not to wrap it too tightly. This may interfere with blood circulation or increase swelling.  Elevate the injured body part above the level of your heart as often as you can.  Only take over-the-counter or prescription medicines for pain, discomfort, or fever as directed by your health care provider.  Warming up prior to exercise helps to prevent future muscle strains. SEEK MEDICAL CARE IF:   You have increasing pain or swelling in the injured area.  You have numbness, tingling, or a significant loss of strength in the injured area. MAKE SURE YOU:   Understand these instructions.  Will watch your condition.  Will get help right away if you are not doing well or get worse. Document Released: 09/12/2005 Document Revised: 07/03/2013 Document Reviewed: 04/11/2013 [facility] Patient Information 2014 [place].   Managing Pain  Pain after surgery or related to activity is often due to strain/injury to muscle, tendon, nerves and/or incisions.  This pain is usually short-term and will improve in a few months.   Many people find it helpful to do the following things TOGETHER to help speed the process of healing and to get back to regular activity more quickly:  1. Avoid heavy physical activity a.  no lifting greater than 20 pounds b. Do not  "push through" the pain.  Listen to your body and avoid positions and maneuvers than reproduce the pain c. Walking is okay as tolerated, but go slowly and stop when getting sore.  d. Remember: If it hurts to do it, then don't do it! 2. Take Anti-inflammatory medication  a. Take with food/snack around the clock for 1-2 weeks i. This helps the muscle and nerve tissues become less irritable and calm down faster b. Choose ONE of the following over-the-counter medications: i. Naproxen 220mg  tabs (ex. Aleve) 1-2 pills twice a day  ii. Ibuprofen 200mg  tabs (ex. Advil, Motrin) 3-4 pills with every meal and just before bedtime iii. Acetaminophen 500mg  tabs (Tylenol) 1-2 pills with every meal and just before bedtime 3. Use a Heating pad or Ice/Cold Pack a. 4-6 times a day b. May use warm bath/hottub  or showers 4. Try Gentle Massage and/or Stretching  a. at the area of pain many times a day b. stop if you feel pain - do not overdo it  Try these steps together to help you body heal faster and avoid making things get worse.  Doing just one of these things may not be enough.    If you  are not getting better after two weeks or are noticing you are getting worse, contact our office for further advice; we may need to re-evaluate you & see what other things we can do to help.

## 2013-12-02 NOTE — Progress Notes (Signed)
Subjective:     Patient ID: Paul Reeves, male   DOB: 04/29/1986, 28 y.o.   MRN: [id number]  HPI  Note: This dictation was prepared with Dragon/digital dictation along with [employer] technology. Any transcriptional errors that result from this process are unintentional.       Paul ChaMiguel D Rickenbach  04/29/1986 [id number]  Patient Care Team: Sissy Hoffavid W Swayne, MD as PCP - General (Family Medicine) Luis AbedJeffrey D Katz, MD as Consulting Physician (Cardiology) Ranelle OysterZachary T Swartz, MD as Consulting Physician (Physical Medicine and Rehabilitation)  This patient is a 28 y.o.male who presents today for surgical evaluation at the request of Dr. Jamey RipaStreck.   Reason for visit: Abdominal pains and gallstones.  Closing omentum.  Incidence of sharp upper abdominal pain in early December.  One around-the-clock chest.  No nausea or vomiting.  No diaphoresis.  No bloating.  He did wake him up at night.  He is not recall heavy meal.  Denies any history of heartburn or reflux.  He did not try to medications and the attack.  Worse with stretching and turning.  He was concern.  He came to the emergency room.  Ultrasound with some gallstones mild thickening.  Concern.  Admitted.  Discussion made about cholecystectomy.  Pain seems more left-sided.  These are medicine study showed normal gallbladder ejection fraction without reproduction of symptoms, or edematous cholecystitis.  They held off.  Had evidence of an intracranial hemorrhage due to poorly controlled hypertension.  Followed by Dr. Myrtis SerKatz with cardiology.  That is better.  The patient saw his rehabilitation Dr.-Swartz.  He had concern of MS left chest wall pain.  The has been placed on pain medicines and muscle relaxants and he.  He feels like the soreness is gradually improving.  He normally can eat anything he wants.  No personal nor family history of GI/colon cancer, inflammatory bowel disease, irritable bowel syndrome, allergy such as Celiac Sprue, dietary/dairy  problems, colitis, ulcers nor gastritis.  No recent sick contacts/gastroenteritis.  No travel outside the country.  No changes in diet.  No dysphagia to solids or liquids.  No significant heartburn or reflux.  No hematochezia, hematemesis, coffee ground emesis.  No evidence of prior gastric/peptic ulceration.   No exertional chest/neck/shoulder/arm pain.  Patient can walk 30 minutes for about 2 miles without difficulty.      Patient Active Problem List   Diagnosis Date Noted  . Myofascial pain on left side 11/29/2013  . Vertigo   . Cholelithiasis   . Hypertension 06/26/2013  . Stroke 06/26/2013  . ICH (intracerebral hemorrhage) 06/26/2013    Past Medical History  Diagnosis Date  . Hypertension 06/26/2013    new dx  . Seasonal allergies     in summer and spring.  . Stroke 06/26/2013    "left me w/weakness on my left side" (08/27/2013)  . 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 Vertigo   . Cholelithiasis     Past Surgical History  Procedure Laterality Date  . No past surgeries      History   Social History  . Marital Status: Single    Spouse Name: N/A    Number of Children: 0  . Years of Education: College   Occupational History  .      n/a   Social History Main Topics  . Smoking status: Never Smoker   . Smokeless tobacco: Never Used  . Alcohol Use: 0.0 oz/week     Comment: 08/27/2013 "glass of wine very rarely"  . Drug  Use: No  . Sexual Activity: No   Other Topics Concern  . Not on file   Social History Narrative   Patient lives at home with mother.   Caffeine Use: 1-2 cups daily    Family History  Problem Relation Age of Onset  . Hypertension Father   . Diabetes Maternal Grandmother   . Stroke Paternal Grandfather   . Hypertension      strong family history of difficult HTN    Current Outpatient Prescriptions  Medication Sig Dispense Refill  . acetaminophen (TYLENOL) 325 MG tablet Take 1-2 tablets (325-650 mg total) by mouth every 4 (four) hours  as needed.      Marland Kitchen amLODipine (NORVASC) 10 MG tablet Take 1 tablet (10 mg total) by mouth at bedtime.  30 tablet  1  . gabapentin (NEURONTIN) 300 MG capsule Take 300 mg by mouth at bedtime.      . hydrochlorothiazide (HYDRODIURIL) 25 MG tablet Take 1 tablet (25 mg total) by mouth daily.  30 tablet  1  . labetalol (NORMODYNE) 300 MG tablet Take 1 tablet (300 mg total) by mouth 2 (two) times daily.  60 tablet  1  . losartan (COZAAR) 50 MG tablet Take 1 tablet (50 mg total) by mouth daily.  30 tablet  6  . methocarbamol (ROBAXIN) 500 MG tablet Take 1 tablet (500 mg total) by mouth every 6 (six) hours as needed for muscle spasms.  60 tablet  3   No current facility-administered medications for this visit.     Allergies  Allergen Reactions  . Peanuts [Peanut Oil] Anaphylaxis  . Naproxen Rash    BP 118/88  Pulse 78  Temp(Src) 97.9 F (36.6 C)  Resp 14  Ht 5\' 11"  (1.803 m)  Wt 185 lb 3.2 oz (84.006 kg)  BMI 25.84 kg/m2  No results found.   Review of Systems  Constitutional: Negative for fever, chills and diaphoresis.  HENT: Negative for ear discharge, facial swelling, mouth sores, nosebleeds, sore throat and trouble swallowing.   Eyes: Negative for photophobia, discharge and visual disturbance.  Respiratory: Negative for choking, chest tightness, shortness of breath and stridor.   Cardiovascular: Negative for chest pain and palpitations.  Gastrointestinal: Negative for nausea, vomiting, abdominal pain, diarrhea, constipation, blood in stool, abdominal distention, anal bleeding and rectal pain.  Endocrine: Negative for cold intolerance and heat intolerance.  Genitourinary: Negative for dysuria, urgency, difficulty urinating and testicular pain.  Musculoskeletal: Negative for arthralgias, back pain, gait problem and myalgias.  Skin: Negative for color change, pallor, rash and wound.  Allergic/Immunologic: Negative for environmental allergies and food allergies.  Neurological: Negative  for dizziness, speech difficulty, weakness, numbness and headaches.  Hematological: Negative for adenopathy. Does not bruise/bleed easily.  Psychiatric/Behavioral: Negative for hallucinations, confusion and agitation.       Objective:   Physical Exam  Constitutional: He is oriented to person, place, and time. He appears well-developed and well-nourished. No distress.  HENT:  Head: Normocephalic.  Mouth/Throat: Oropharynx is clear and moist. No oropharyngeal exudate.  Eyes: Conjunctivae and EOM are normal. Pupils are equal, round, and reactive to light. No scleral icterus.  Neck: Normal range of motion. Neck supple. No tracheal deviation present.  Cardiovascular: Normal rate, regular rhythm and intact distal pulses.   Pulmonary/Chest: Effort normal and breath sounds normal. No respiratory distress.   He exhibits tenderness.    Abdominal: Soft. He exhibits no distension. There is tenderness in the left upper quadrant. There is no rigidity, no rebound, no  guarding, no tenderness at McBurney's point and negative Murphy's sign. No hernia. Hernia confirmed negative in the ventral area, confirmed negative in the right inguinal area and confirmed negative in the left inguinal area.  Musculoskeletal: Normal range of motion. He exhibits no tenderness.  Lymphadenopathy:    He has no cervical adenopathy.       Right: No inguinal adenopathy present.       Left: No inguinal adenopathy present.  Neurological: He is alert and oriented to person, place, and time. No cranial nerve deficit. He exhibits normal muscle tone. Coordination normal.  Skin: Skin is warm and dry. No rash noted. He is not diaphoretic. No erythema. No pallor.  Psychiatric: He has a normal mood and affect. His behavior is normal. Judgment and thought content normal.       Assessment:     Left upper quadrant abdominal chest wall pain.  Does not seem consistent with biliary colic.  Incidental gallstones.  No reproduction of  symptoms with HIDA/CCK     Plan:     I think if stones are incidental.  History stocked consistent with biliary colic.  The nuclear medicine study did not reproduce symptoms nor seem abnormal.  It has been over 3 months since this attack.I agree that his symptoms seem more musculoskeletal.  He has been managed by his rehabilitation doctor.  I agree with heat and anti-inflammatories.  Hope for muscle relaxants to towards.  It seems to be gradually getting better.  Increase activity as tolerated to regular activity.  Low impact exercise such as walking an hour a day at least ideal.  Do not push through pain.  Diet as tolerated.  Low fat high fiber diet ideal.  Bowel regimen with 30 g fiber a day and fiber supplement as needed to avoid problems.  Return to clinic as needed.   Instructions discussed.  Followup with primary care physician for other health issues as would normally be done.  Consider screening for malignancies (breast, prostate, colon, melanoma, etc) as appropriate.  Questions answered.  The patient expressed understanding and appreciation

## 2013-12-11 ENCOUNTER — Ambulatory Visit: Payer: BC Managed Care – PPO | Admitting: Nurse Practitioner

## 2013-12-17 ENCOUNTER — Other Ambulatory Visit (HOSPITAL_COMMUNITY): Payer: BC Managed Care – PPO

## 2014-01-03 ENCOUNTER — Encounter: Payer: Self-pay | Admitting: Cardiology

## 2014-01-03 ENCOUNTER — Ambulatory Visit (HOSPITAL_COMMUNITY): Payer: BC Managed Care – PPO | Attending: Cardiology | Admitting: Radiology

## 2014-01-03 DIAGNOSIS — I639 Cerebral infarction, unspecified: Secondary | ICD-10-CM

## 2014-01-03 DIAGNOSIS — I77819 Aortic ectasia, unspecified site: Secondary | ICD-10-CM | POA: Insufficient documentation

## 2014-01-03 DIAGNOSIS — R9430 Abnormal result of cardiovascular function study, unspecified: Secondary | ICD-10-CM | POA: Insufficient documentation

## 2014-01-03 DIAGNOSIS — Z8673 Personal history of transient ischemic attack (TIA), and cerebral infarction without residual deficits: Secondary | ICD-10-CM | POA: Insufficient documentation

## 2014-01-03 DIAGNOSIS — I10 Essential (primary) hypertension: Secondary | ICD-10-CM

## 2014-01-03 DIAGNOSIS — Q8740 Marfan's syndrome, unspecified: Secondary | ICD-10-CM

## 2014-01-03 DIAGNOSIS — I351 Nonrheumatic aortic (valve) insufficiency: Secondary | ICD-10-CM | POA: Insufficient documentation

## 2014-01-03 DIAGNOSIS — I6789 Other cerebrovascular disease: Secondary | ICD-10-CM

## 2014-01-03 NOTE — Progress Notes (Signed)
Echocardiogram performed.  

## 2014-01-07 ENCOUNTER — Telehealth: Payer: Self-pay

## 2014-01-07 NOTE — Telephone Encounter (Addendum)
Ms. Paul Reeves @ [employer] (pt's employer) is requesting a call regarding patient's medical accommodations. Dr. Riley KillSwartz filled out some paperwork for the employer and now employer has questions.  Ms. Paul Reeves can be reached at [phone].

## 2014-01-08 NOTE — Telephone Encounter (Signed)
i spoke with Mrs Paul Reeves on phone to clarify this situation

## 2014-01-28 ENCOUNTER — Encounter
Payer: BC Managed Care – PPO | Attending: Physical Medicine & Rehabilitation | Admitting: Physical Medicine & Rehabilitation

## 2014-01-28 ENCOUNTER — Encounter: Payer: Self-pay | Admitting: Physical Medicine & Rehabilitation

## 2014-01-28 VITALS — BP 134/85 | HR 65 | Resp 14 | Ht 71.0 in | Wt 186.0 lb

## 2014-01-28 DIAGNOSIS — I10 Essential (primary) hypertension: Secondary | ICD-10-CM | POA: Insufficient documentation

## 2014-01-28 DIAGNOSIS — Z8673 Personal history of transient ischemic attack (TIA), and cerebral infarction without residual deficits: Secondary | ICD-10-CM | POA: Insufficient documentation

## 2014-01-28 DIAGNOSIS — M7918 Myalgia, other site: Secondary | ICD-10-CM

## 2014-01-28 DIAGNOSIS — I619 Nontraumatic intracerebral hemorrhage, unspecified: Secondary | ICD-10-CM

## 2014-01-28 DIAGNOSIS — IMO0001 Reserved for inherently not codable concepts without codable children: Secondary | ICD-10-CM | POA: Insufficient documentation

## 2014-01-28 DIAGNOSIS — G8190 Hemiplegia, unspecified affecting unspecified side: Secondary | ICD-10-CM | POA: Insufficient documentation

## 2014-01-28 NOTE — Progress Notes (Signed)
Subjective:    Patient ID: Paul Reeves, male    DOB: October 15, 1985, 28 y.o.   MRN: [id number]  HPI  Paul Reeves is back regarding his ICH. Work has been going pretty well. He hasn't had issues with getting into work.   He did not sleep well last night but otherwise has done pretty well with sleep. He also has had some nausea this AM and is concerned he might be coming down with something.  His LUQ pain has improved with the use of the robaxin. He also has started on a work out program with stretching which has helped. He also has utilized heat and ice.   No problems reported with driving.   His BP has been under control at home.    Pain Inventory Average Pain 4 Pain Right Now 4 My pain is dull and aching  In the last 24 hours, has pain interfered with the following? General activity 4 Relation with others 4 Enjoyment of life 4 What TIME of day is your pain at its worst? daytime Sleep (in general) Fair  Pain is worse with: walking and standing Pain improves with: rest and heat/ice Relief from Meds: 5  Mobility walk without assistance ability to climb steps?  yes do you drive?  yes transfers alone  Function employed # of hrs/week 40  Neuro/Psych weakness tingling spasms dizziness  Prior Studies Any changes since last visit?  no  Physicians involved in your care Any changes since last visit?  no   Family History  Problem Relation Age of Onset  . Hypertension Father   . Diabetes Maternal Grandmother   . Stroke Paternal Grandfather   . Hypertension      strong family history of difficult HTN   History   Social History  . Marital Status: Single    Spouse Name: N/A    Number of Children: 0  . Years of Education: College   Occupational History  .      n/a   Social History Main Topics  . Smoking status: Never Smoker   . Smokeless tobacco: Never Used  . Alcohol Use: 0.0 oz/week     Comment: 08/27/2013 "glass of wine very rarely"  . Drug Use: No    . Sexual Activity: No   Other Topics Concern  . None   Social History Narrative   Patient lives at home with mother.   Caffeine Use: 1-2 cups daily   Past Surgical History  Procedure Laterality Date  . No past surgeries     Past Medical History  Diagnosis Date  . Hypertension 06/26/2013    new dx  . Seasonal allergies     in summer and spring.  . Stroke 06/26/2013    "left me w/weakness on my left side" (08/27/2013)  . 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 Vertigo   . Cholelithiasis   . Ejection fraction    BP 134/85  Pulse 65  Resp 14  Ht 5\' 11"  (1.803 m)  Wt 186 lb (84.369 kg)  BMI 25.95 kg/m2  SpO2 99%  Opioid Risk Score:   Fall Risk Score: Low Fall Risk (0-5 points) (Patient educated handout declined)   Review of Systems  Neurological: Positive for dizziness and weakness.  All other systems reviewed and are negative.      Objective:   Physical Exam Constitutional: He is oriented to person, place, and time. He appears well-developed and well-nourished. He lost weight. He appears quite fatigued today and is yawning frequently.  HENT: oral mucosa pink and moist Head: Normocephalic and atraumatic.  Eyes: Conjunctivae are normal. Pupils are equal, round, and reactive to light.  Neck: Normal range of motion. Neck supple.  Cardiovascular: Normal rate and regular rhythm. No murmur  Pulmonary/Chest: Effort normal and breath sounds normal. No respiratory distress. He has no wheezes.  Abdominal: Soft. Bowel sounds are normal. He exhibits no distension..  Musculoskeletal: He exhibits no edema. Has tightness along the left obliques. Area tender and taut with palpation.  Neurological: He is fatigued appearing and oriented to person, place, and time.  Speech clear. Left facial weakness is minimal. Left hemiparesis with sensory deficits. LUE is 4+ deltoid, 4 to 4+/5 bic, tric, HI. LLE is 4+/5 HF, KE; ADF 1-2, APF 2-3. Sensation 1/2 left arm and leg but better. Mild  left facial weakness, visual fields intact. He walks with minimal circumduction on the left although he still lacks fluidity with the left leg during stance and swing.    Skin: Skin is warm and dry.  Psychiatric: He has a normal mood and affect. His behavior is normal. Judgment and thought content normal.   Assessment/Plan:  1. 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 Pain Management: left upper quadrant pain----musculoskeletal/myofascial  -continue appropriate stretching, heat, ice  -prn meloxicam  -needs further work on gait   3. Driving--local driving as able  4. . Malignant HTN: bp normotensive for the most part. Continue to follow 5. Follow up with me prn. I wrote out of work due to his fatigue and nausea (for today only).  30 minutes of face to face patient care time were spent during this visit. All questions were encouraged and answered.

## 2014-01-28 NOTE — Patient Instructions (Signed)
PLEASE CALL ME WITH ANY PROBLEMS OR QUESTIONS (#[phone]).      

## 2014-03-05 ENCOUNTER — Ambulatory Visit: Payer: BC Managed Care – PPO | Admitting: Cardiology

## 2014-03-17 ENCOUNTER — Ambulatory Visit: Payer: Self-pay | Admitting: Nurse Practitioner

## 2014-05-13 ENCOUNTER — Emergency Department (HOSPITAL_COMMUNITY): Payer: Self-pay

## 2014-05-13 ENCOUNTER — Emergency Department (HOSPITAL_COMMUNITY)
Admission: EM | Admit: 2014-05-13 | Discharge: 2014-05-13 | Disposition: A | Discharge status: Home / Self Care | Payer: Self-pay | Attending: Emergency Medicine | Admitting: Emergency Medicine

## 2014-05-13 ENCOUNTER — Encounter (HOSPITAL_COMMUNITY): Payer: Self-pay | Admitting: Emergency Medicine

## 2014-05-13 DIAGNOSIS — K8060 Calculus of gallbladder and bile duct with cholecystitis, unspecified, without obstruction: Principal | ICD-10-CM | POA: Diagnosis present

## 2014-05-13 DIAGNOSIS — K8064 Calculus of gallbladder and bile duct with chronic cholecystitis without obstruction: Principal | ICD-10-CM | POA: Diagnosis present

## 2014-05-13 DIAGNOSIS — R1012 Left upper quadrant pain: Secondary | ICD-10-CM | POA: Insufficient documentation

## 2014-05-13 DIAGNOSIS — R1013 Epigastric pain: Secondary | ICD-10-CM | POA: Insufficient documentation

## 2014-05-13 DIAGNOSIS — I69998 Other sequelae following unspecified cerebrovascular disease: Secondary | ICD-10-CM

## 2014-05-13 DIAGNOSIS — Z79899 Other long term (current) drug therapy: Secondary | ICD-10-CM | POA: Insufficient documentation

## 2014-05-13 DIAGNOSIS — Z8249 Family history of ischemic heart disease and other diseases of the circulatory system: Secondary | ICD-10-CM

## 2014-05-13 DIAGNOSIS — Z823 Family history of stroke: Secondary | ICD-10-CM

## 2014-05-13 DIAGNOSIS — G8190 Hemiplegia, unspecified affecting unspecified side: Secondary | ICD-10-CM | POA: Diagnosis present

## 2014-05-13 DIAGNOSIS — Z8673 Personal history of transient ischemic attack (TIA), and cerebral infarction without residual deficits: Secondary | ICD-10-CM | POA: Insufficient documentation

## 2014-05-13 DIAGNOSIS — R079 Chest pain, unspecified: Secondary | ICD-10-CM | POA: Insufficient documentation

## 2014-05-13 DIAGNOSIS — I129 Hypertensive chronic kidney disease with stage 1 through stage 4 chronic kidney disease, or unspecified chronic kidney disease: Secondary | ICD-10-CM | POA: Diagnosis present

## 2014-05-13 DIAGNOSIS — I10 Essential (primary) hypertension: Secondary | ICD-10-CM | POA: Insufficient documentation

## 2014-05-13 DIAGNOSIS — N182 Chronic kidney disease, stage 2 (mild): Secondary | ICD-10-CM | POA: Diagnosis present

## 2014-05-13 DIAGNOSIS — Z8719 Personal history of other diseases of the digestive system: Secondary | ICD-10-CM | POA: Insufficient documentation

## 2014-05-13 DIAGNOSIS — Z8709 Personal history of other diseases of the respiratory system: Secondary | ICD-10-CM | POA: Insufficient documentation

## 2014-05-13 LAB — BASIC METABOLIC PANEL
Anion gap: 11 (ref 5–15)
BUN: 18 mg/dL (ref 6–23)
CHLORIDE: 102 meq/L (ref 96–112)
CO2: 28 mEq/L (ref 19–32)
Calcium: 10.1 mg/dL (ref 8.4–10.5)
Creatinine, Ser: 1.49 mg/dL — ABNORMAL HIGH (ref 0.50–1.35)
GFR, EST AFRICAN AMERICAN: 72 mL/min — AB (ref >90)
GFR, EST NON AFRICAN AMERICAN: 62 mL/min — AB (ref >90)
Glucose, Bld: 124 mg/dL — ABNORMAL HIGH (ref 70–99)
POTASSIUM: 3.4 meq/L — AB (ref 3.7–5.3)
Sodium: 141 mEq/L (ref 137–147)

## 2014-05-13 LAB — HEPATIC FUNCTION PANEL
ALT: 33 U/L (ref 0–53)
AST: 59 U/L — ABNORMAL HIGH (ref 0–37)
Albumin: 3.8 g/dL (ref 3.5–5.2)
Alkaline Phosphatase: 62 U/L (ref 39–117)
BILIRUBIN INDIRECT: 0.7 mg/dL (ref 0.3–0.9)
Bilirubin, Direct: 0.3 mg/dL (ref 0.0–0.3)
TOTAL PROTEIN: 7 g/dL (ref 6.0–8.3)
Total Bilirubin: 1 mg/dL (ref 0.3–1.2)

## 2014-05-13 LAB — CBC
HCT: 40.4 % (ref 39.0–52.0)
Hemoglobin: 14.1 g/dL (ref 13.0–17.0)
MCH: 30.5 pg (ref 26.0–34.0)
MCHC: 34.9 g/dL (ref 30.0–36.0)
MCV: 87.3 fL (ref 78.0–100.0)
Platelets: 244 10*3/uL (ref 150–400)
RBC: 4.63 MIL/uL (ref 4.22–5.81)
RDW: 12.2 % (ref 11.5–15.5)
WBC: 6.1 10*3/uL (ref 4.0–10.5)

## 2014-05-13 LAB — I-STAT TROPONIN, ED: TROPONIN I, POC: 0 ng/mL (ref 0.00–0.08)

## 2014-05-13 LAB — LIPASE, BLOOD: Lipase: 51 U/L (ref 11–59)

## 2014-05-13 MED ORDER — MORPHINE SULFATE 4 MG/ML IJ SOLN
4.0000 mg | Freq: Once | INTRAMUSCULAR | Status: AC
  Administered 2014-05-13: 4 mg via INTRAVENOUS
  Filled 2014-05-13: qty 1

## 2014-05-13 MED ORDER — IOHEXOL 300 MG/ML  SOLN
100.0000 mL | Freq: Once | INTRAMUSCULAR | Status: AC | PRN
  Administered 2014-05-13: 100 mL via INTRAVENOUS

## 2014-05-13 MED ORDER — ONDANSETRON HCL 4 MG/2ML IJ SOLN
4.0000 mg | Freq: Once | INTRAMUSCULAR | Status: AC
  Administered 2014-05-13: 4 mg via INTRAVENOUS
  Filled 2014-05-13: qty 2

## 2014-05-13 MED ORDER — IOHEXOL 300 MG/ML  SOLN
25.0000 mL | INTRAMUSCULAR | Status: AC
  Administered 2014-05-13: 25 mL via ORAL

## 2014-05-13 MED ORDER — HYDROCODONE-ACETAMINOPHEN 5-325 MG PO TABS: 1.0000 | ORAL_TABLET | ORAL | Status: DC | PRN

## 2014-05-13 MED ORDER — SODIUM CHLORIDE 0.9 % IV SOLN
INTRAVENOUS | Status: DC
  Administered 2014-05-13: 08:00:00 via INTRAVENOUS

## 2014-05-13 MED ORDER — GI COCKTAIL ~~LOC~~
30.0000 mL | Freq: Once | ORAL | Status: AC
  Administered 2014-05-13: 30 mL via ORAL
  Filled 2014-05-13: qty 30

## 2014-05-13 MED ORDER — FAMOTIDINE 20 MG PO TABS: 20.0000 mg | ORAL_TABLET | Freq: Two times a day (BID) | ORAL | Status: AC

## 2014-05-13 NOTE — ED Notes (Signed)
CT informed that pt is done with his contrast

## 2014-05-13 NOTE — Discharge Instructions (Signed)
Abdominal Pain  Many things can cause abdominal pain. Usually, abdominal pain is not caused by a disease and will improve without treatment. It can often be observed and treated at home. Your health care provider will do a physical exam and possibly order blood tests and X-rays to help determine the seriousness of your pain. However, in many cases, more time must pass before a clear cause of the pain can be found. Before that point, your health care provider may not know if you need more testing or further treatment.  HOME CARE INSTRUCTIONS   Monitor your abdominal pain for any changes. The following actions may help to alleviate any discomfort you are experiencing:  · Only take over-the-counter or prescription medicines as directed by your health care provider.  · Do not take laxatives unless directed to do so by your health care provider.  · Try a clear liquid diet (broth, tea, or water) as directed by your health care provider. Slowly move to a bland diet as tolerated.  SEEK MEDICAL CARE IF:  · You have unexplained abdominal pain.  · You have abdominal pain associated with nausea or diarrhea.  · You have pain when you urinate or have a bowel movement.  · You experience abdominal pain that wakes you in the night.  · You have abdominal pain that is worsened or improved by eating food.  · You have abdominal pain that is worsened with eating fatty foods.  · You have a fever.  SEEK IMMEDIATE MEDICAL CARE IF:   · Your pain does not go away within 2 hours.  · You keep throwing up (vomiting).  · Your pain is felt only in portions of the abdomen, such as the right side or the left lower portion of the abdomen.  · You pass bloody or black tarry stools.  MAKE SURE YOU:  · Understand these instructions.    · Will watch your condition.    · Will get help right away if you are not doing well or get worse.    Document Released: 06/22/2005 Document Revised: 09/17/2013 Document Reviewed: 05/22/2013  ExitCare® Patient Information  ©2015 [employer]. This information is not intended to replace advice given to you by your health care provider. Make sure you discuss any questions you have with your health care provider.  Abdominal Pain  Many things can cause abdominal pain. Usually, abdominal pain is not caused by a disease and will improve without treatment. It can often be observed and treated at home. Your health care provider will do a physical exam and possibly order blood tests and X-rays to help determine the seriousness of your pain. However, in many cases, more time must pass before a clear cause of the pain can be found. Before that point, your health care provider may not know if you need more testing or further treatment.  HOME CARE INSTRUCTIONS   Monitor your abdominal pain for any changes. The following actions may help to alleviate any discomfort you are experiencing:  · Only take over-the-counter or prescription medicines as directed by your health care provider.  · Do not take laxatives unless directed to do so by your health care provider.  · Try a clear liquid diet (broth, tea, or water) as directed by your health care provider. Slowly move to a bland diet as tolerated.  SEEK MEDICAL CARE IF:  · You have unexplained abdominal pain.  · You have abdominal pain associated with nausea or diarrhea.  · You have pain when you   urinate or have a bowel movement.  · You experience abdominal pain that wakes you in the night.  · You have abdominal pain that is worsened or improved by eating food.  · You have abdominal pain that is worsened with eating fatty foods.  · You have a fever.  SEEK IMMEDIATE MEDICAL CARE IF:   · Your pain does not go away within 2 hours.  · You keep throwing up (vomiting).  · Your pain is felt only in portions of the abdomen, such as the right side or the left lower portion of the abdomen.  · You pass bloody or black tarry stools.  MAKE SURE YOU:  · Understand these instructions.    · Will watch your condition.     · Will get help right away if you are not doing well or get worse.    Document Released: 06/22/2005 Document Revised: 09/17/2013 Document Reviewed: 05/22/2013  ExitCare® Patient Information ©2015 [employer]. This information is not intended to replace advice given to you by your health care provider. Make sure you discuss any questions you have with your health care provider.

## 2014-05-13 NOTE — ED Notes (Signed)
Pt was asleep and was awakened by a sharp shooting pain from substernal to abd. Pt rates pain 7/10. Pt states he had the same thing happen last year and they were unable to give him an answer as to what was causing his sx. Pt alert and oriented, NAD.

## 2014-05-13 NOTE — ED Provider Notes (Signed)
CSN: [id number]     Arrival date & time 05/13/14  0458 History   First MD Initiated Contact with Patient 05/13/14 [phone]     Chief Complaint  Patient presents with  . Chest Pain     (Consider location/radiation/quality/duration/timing/severity/associated sxs/prior Treatment) HPI  Patient to the ER from home with complaints of epigastric, LUQ abdominal pain that woke him out of his sleep at approx 5am this morning. He described it as epigastric shooting/stabbing and then radiating up into his chest. The most severe pain lasted for 30 minutes and was a 7/10 pain. No associated symptoms of fevers, nausea, vomiting, diarrhea, abdominal pains, SOB, wheezing, coughing, syncope or weakness. He reports that this happened around this time last year, an abdominal US was done and they were unable to tell him why he had the pain or was hurting. He followed up with the gastroenterologist who was also unable to tell him why he was hurting. He has had no episodes since then. He has a PMH of stroke 1 year ago due to uncontrolled hypertension, today his BP is 122/72 and well controlled.  Past Medical History  Diagnosis Date  . Hypertension 06/26/2013    new dx  . Seasonal allergies     in summer and spring.  . Stroke 06/26/2013    "left me w/weakness on my left side" (08/27/2013)  . 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 Vertigo   . Cholelithiasis   . Ejection fraction    Past Surgical History  Procedure Laterality Date  . No past surgeries     Family History  Problem Relation Age of Onset  . Hypertension Father   . Diabetes Maternal Grandmother   . Stroke Paternal Grandfather   . Hypertension      strong family history of difficult HTN   History  Substance Use Topics  . Smoking status: Never Smoker   . Smokeless tobacco: Never Used  . Alcohol Use: 0.0 oz/week     Comment: 08/27/2013 "glass of wine very rarely"    Review of Systems   Review of Systems  Gen: no weight loss, fevers,  chills, night sweats  Eyes: no occular draining, occular pain,  No visual changes  Nose: no epistaxis or rhinorrhea  Mouth: no dental pain, no sore throat  Neck: no neck pain  Lungs: No hemoptysis. No wheezing or coughing CV:  No palpitations, dependent edema or orthopnea. No chest pain Abd: no diarrhea. No nausea or vomiting, + epigastric abdominal pain  GU: no dysuria or gross hematuria  MSK:  No muscle weakness, No muscular pain Neuro: no headache, no focal neurologic deficits  Skin: no rash , no wounds Psyche: no complaints of depression or anxiety    Allergies  Peanuts and Naproxen  Home Medications   Prior to Admission medications   Medication Sig Start Date End Date Taking? Authorizing Provider  amLODipine (NORVASC) 10 MG tablet Take 1 tablet (10 mg total) by mouth at bedtime. 07/19/13  Yes Evlyn Kanner Love, PA-C  hydrochlorothiazide (HYDRODIURIL) 25 MG tablet Take 1 tablet (25 mg total) by mouth daily. 07/19/13  Yes Evlyn Kanner Love, PA-C  labetalol (NORMODYNE) 300 MG tablet Take 1 tablet (300 mg total) by mouth 2 (two) times daily. 07/19/13  Yes Evlyn Kanner Love, PA-C  losartan (COZAAR) 50 MG tablet Take 1 tablet (50 mg total) by mouth daily. 11/04/13  Yes Luis Abed, MD  famotidine (PEPCID) 20 MG tablet Take 1 tablet (20 mg total) by mouth 2 (two)  times daily. 05/13/14   Olney Monier Irine Seal, PA-C  HYDROcodone-acetaminophen (NORCO/VICODIN) 5-325 MG per tablet Take 1-2 tablets by mouth every 4 (four) hours as needed. 05/13/14   Mahalia Dykes Irine Seal, PA-C   BP 115/84  Pulse 56  Temp(Src) 97.9 F (36.6 C) (Oral)  Resp 14  SpO2 100% Physical Exam  Nursing note and vitals reviewed. Constitutional: He appears well-developed and well-nourished. No distress.  HENT:  Head: Normocephalic and atraumatic.  Eyes: Pupils are equal, round, and reactive to light.  Neck: Normal range of motion. Neck supple.  Cardiovascular: Normal rate and regular rhythm.   Pulmonary/Chest: Effort normal and  breath sounds normal. He exhibits no tenderness and no laceration.  Abdominal: Soft. Bowel sounds are normal. He exhibits no distension and no ascites. There is tenderness in the epigastric area and left upper quadrant. There is no rigidity, no rebound, no guarding and no CVA tenderness.  Neurological: He is alert.  Skin: Skin is warm and dry.    ED Course  Procedures (including critical care time) Labs Review Labs Reviewed  BASIC METABOLIC PANEL - Abnormal; Notable for the following:    Potassium 3.4 (*)    Glucose, Bld 124 (*)    Creatinine, Ser 1.49 (*)    GFR calc non Af Amer 62 (*)    GFR calc Af Amer 72 (*)    All other components within normal limits  HEPATIC FUNCTION PANEL - Abnormal; Notable for the following:    AST 59 (*)    All other components within normal limits  CBC  LIPASE, BLOOD  I-STAT TROPOININ, ED    Imaging Review Dg Chest 2 View  05/13/2014   CLINICAL DATA:  Pain.  EXAM: CHEST  2 VIEW  COMPARISON:  08/27/2013.  FINDINGS: The heart size and mediastinal contours are within normal limits. Both lungs are clear. The visualized skeletal structures are unremarkable.  IMPRESSION: No active cardiopulmonary disease.  Unchanged from prior exam.   Electronically Signed   By: Maisie Fus  Register   On: 05/13/2014 07:27   Ct Abdomen Pelvis W Contrast  05/13/2014   CLINICAL DATA:  28 year old with acute onset of left upper quadrant pain.  EXAM: CT ABDOMEN AND PELVIS WITH CONTRAST  TECHNIQUE: Multidetector CT imaging of the abdomen and pelvis was performed using the standard protocol following bolus administration of intravenous contrast.  CONTRAST:  OMNIPAQUE IOHEXOL 300 MG/ML  SOLN  COMPARISON:  None.  FINDINGS: The lung bases are clear.  No evidence for free air.  Normal appearance of the liver and portal venous system. The gallbladder is non distended but difficult to exclude mild gallbladder wall thickening. Normal appearance of the pancreas, spleen, adrenal glands and  both kidneys. No significant free fluid or lymphadenopathy. Normal appearance of the prostate and urinary bladder. There is a retrocecal appendix without acute inflammation. There is mild dilatation of the small bowel loops in the mid abdomen. There is no significant bowel wall thickening.  No acute bone abnormality.  IMPRESSION: No acute abnormalities within the abdomen or pelvis.  Mild dilatation of small bowel loops without surrounding inflammatory changes or wall thickening. The small bowel findings are nonspecific.  Question mild gallbladder wall thickening without gallbladder distention. If this is the area of clinical concern, consider further evaluation with ultrasound.   Electronically Signed   By: Richarda Overlie M.D.   On: 05/13/2014 09:54     EKG Interpretation   Date/Time:  Tuesday May 13 2014 05:11:18 EDT Ventricular Rate:  71  PR Interval:  178 QRS Duration: 82 QT Interval:  390 QTC Calculation: 412 R Axis:   86 Text Interpretation:  Sinus arrhythmia No significant change was found  Confirmed by Manus GunningANCOUR  MD, STEPHEN ([id number]) on 05/13/2014 5:34:07 AM      MDM   Final diagnoses:  Epigastric abdominal pain    Patients symptoms controlled with GI cocktail and Morphine IV. On re-evaluation his stomach continues to be non tender, non distended with no guarding. He is pain free and has no other symptoms. He is being referred back to his PCP, he has seen a GI doctor and does not prefer to see one again. Gall bladder noted to be thickened but he has no RUQ pain.   famotidine (PEPCID) 20 MG tablet Take 1 tablet (20 mg total) by mouth 2 (two) times daily. 30 tablet Dorthula Matasiffany G Noreen Mackintosh, PA-C   HYDROcodone-acetaminophen (NORCO/VICODIN) 5-325 MG per tablet Take 1-2 tablets by mouth every 4 (four) hours as needed. 10 tablet Dorthula Matasiffany G Ansh Fauble, PA-C   27 y.o.Kyshaun D Edell's evaluation in the Emergency Department is complete. It has been determined that no acute conditions requiring further  emergency intervention are present at this time. The patient/guardian have been advised of the diagnosis and plan. We have discussed signs and symptoms that warrant return to the ED, such as changes or worsening in symptoms.  Vital signs are stable at discharge. Filed Vitals:   05/13/14 1000  BP: 115/84  Pulse: 56  Temp:   Resp: 14    Patient/guardian has voiced understanding and agreed to follow-up with the PCP or specialist.     Dorthula Matasiffany G Nikoleta Dady, PA-C 05/13/14 1039

## 2014-05-13 NOTE — ED Notes (Signed)
Pt reports upper abdominal pain. Was seen at 4am for same- dx with gallstones and sent home with medications.  Pain returned after he ate and took meds. Pt ambulatory. Vs stable.

## 2014-05-13 NOTE — ED Notes (Signed)
Pt alert and oriented at discharge.  Pt offered a wheelchair at discharge but refused.  Taken to the ED by this RN.  Pt had a stable gate during ambulation.

## 2014-05-14 ENCOUNTER — Inpatient Hospital Stay (HOSPITAL_COMMUNITY): Payer: Self-pay

## 2014-05-14 ENCOUNTER — Inpatient Hospital Stay (HOSPITAL_COMMUNITY)
Admission: EM | Admit: 2014-05-14 | Discharge: 2014-05-17 | DRG: 418 | Disposition: A | Discharge status: Home / Self Care | Payer: Self-pay | Attending: Internal Medicine | Admitting: Internal Medicine

## 2014-05-14 ENCOUNTER — Emergency Department (HOSPITAL_COMMUNITY): Payer: Self-pay | Attending: Emergency Medicine

## 2014-05-14 ENCOUNTER — Encounter (HOSPITAL_COMMUNITY): Payer: Self-pay

## 2014-05-14 DIAGNOSIS — R945 Abnormal results of liver function studies: Secondary | ICD-10-CM

## 2014-05-14 DIAGNOSIS — K8050 Calculus of bile duct without cholangitis or cholecystitis without obstruction: Secondary | ICD-10-CM

## 2014-05-14 DIAGNOSIS — R9430 Abnormal result of cardiovascular function study, unspecified: Secondary | ICD-10-CM

## 2014-05-14 DIAGNOSIS — I77819 Aortic ectasia, unspecified site: Secondary | ICD-10-CM

## 2014-05-14 DIAGNOSIS — K8020 Calculus of gallbladder without cholecystitis without obstruction: Secondary | ICD-10-CM

## 2014-05-14 DIAGNOSIS — R7989 Other specified abnormal findings of blood chemistry: Secondary | ICD-10-CM | POA: Diagnosis present

## 2014-05-14 DIAGNOSIS — R109 Unspecified abdominal pain: Secondary | ICD-10-CM

## 2014-05-14 DIAGNOSIS — I351 Nonrheumatic aortic (valve) insufficiency: Secondary | ICD-10-CM

## 2014-05-14 DIAGNOSIS — IMO0002 Reserved for concepts with insufficient information to code with codable children: Secondary | ICD-10-CM

## 2014-05-14 DIAGNOSIS — R42 Dizziness and giddiness: Secondary | ICD-10-CM

## 2014-05-14 DIAGNOSIS — M7918 Myalgia, other site: Secondary | ICD-10-CM

## 2014-05-14 DIAGNOSIS — I639 Cerebral infarction, unspecified: Secondary | ICD-10-CM

## 2014-05-14 DIAGNOSIS — I10 Essential (primary) hypertension: Secondary | ICD-10-CM

## 2014-05-14 LAB — BASIC METABOLIC PANEL
Anion gap: 13 (ref 5–15)
BUN: 14 mg/dL (ref 6–23)
CO2: 28 meq/L (ref 19–32)
Calcium: 9.2 mg/dL (ref 8.4–10.5)
Chloride: 103 mEq/L (ref 96–112)
Creatinine, Ser: 1.43 mg/dL — ABNORMAL HIGH (ref 0.50–1.35)
GFR calc non Af Amer: 65 mL/min — ABNORMAL LOW (ref >90)
GFR, EST AFRICAN AMERICAN: 75 mL/min — AB (ref >90)
Glucose, Bld: 87 mg/dL (ref 70–99)
POTASSIUM: 3.7 meq/L (ref 3.7–5.3)
Sodium: 144 mEq/L (ref 137–147)

## 2014-05-14 LAB — CBC WITH DIFFERENTIAL/PLATELET
BASOS ABS: 0 10*3/uL (ref 0.0–0.1)
BASOS ABS: 0 10*3/uL (ref 0.0–0.1)
BASOS PCT: 0 % (ref 0–1)
Basophils Relative: 0 % (ref 0–1)
Eosinophils Absolute: 0 10*3/uL (ref 0.0–0.7)
Eosinophils Absolute: 0 10*3/uL (ref 0.0–0.7)
Eosinophils Relative: 1 % (ref 0–5)
Eosinophils Relative: 1 % (ref 0–5)
HEMATOCRIT: 39.2 % (ref 39.0–52.0)
HEMATOCRIT: 41.6 % (ref 39.0–52.0)
HEMOGLOBIN: 13.6 g/dL (ref 13.0–17.0)
HEMOGLOBIN: 14.6 g/dL (ref 13.0–17.0)
LYMPHS PCT: 13 % (ref 12–46)
Lymphocytes Relative: 26 % (ref 12–46)
Lymphs Abs: 0.7 10*3/uL (ref 0.7–4.0)
Lymphs Abs: 1.4 10*3/uL (ref 0.7–4.0)
MCH: 30.4 pg (ref 26.0–34.0)
MCH: 30.7 pg (ref 26.0–34.0)
MCHC: 34.7 g/dL (ref 30.0–36.0)
MCHC: 35.1 g/dL (ref 30.0–36.0)
MCV: 87.4 fL (ref 78.0–100.0)
MCV: 87.5 fL (ref 78.0–100.0)
MONO ABS: 0.3 10*3/uL (ref 0.1–1.0)
MONOS PCT: 6 % (ref 3–12)
Monocytes Absolute: 0.4 10*3/uL (ref 0.1–1.0)
Monocytes Relative: 8 % (ref 3–12)
NEUTROS ABS: 4.6 10*3/uL (ref 1.7–7.7)
NEUTROS PCT: 65 % (ref 43–77)
Neutro Abs: 3.4 10*3/uL (ref 1.7–7.7)
Neutrophils Relative %: 80 % — ABNORMAL HIGH (ref 43–77)
Platelets: 247 10*3/uL (ref 150–400)
Platelets: 259 10*3/uL (ref 150–400)
RBC: 4.48 MIL/uL (ref 4.22–5.81)
RBC: 4.76 MIL/uL (ref 4.22–5.81)
RDW: 12.1 % (ref 11.5–15.5)
RDW: 12.3 % (ref 11.5–15.5)
WBC: 5.3 10*3/uL (ref 4.0–10.5)
WBC: 5.8 10*3/uL (ref 4.0–10.5)

## 2014-05-14 LAB — COMPREHENSIVE METABOLIC PANEL
ALBUMIN: 3.9 g/dL (ref 3.5–5.2)
ALK PHOS: 120 U/L — AB (ref 39–117)
ALT: 691 U/L — ABNORMAL HIGH (ref 0–53)
ANION GAP: 12 (ref 5–15)
AST: 746 U/L — ABNORMAL HIGH (ref 0–37)
BILIRUBIN TOTAL: 4.6 mg/dL — AB (ref 0.3–1.2)
BUN: 16 mg/dL (ref 6–23)
CO2: 28 mEq/L (ref 19–32)
Calcium: 9.6 mg/dL (ref 8.4–10.5)
Chloride: 100 mEq/L (ref 96–112)
Creatinine, Ser: 1.44 mg/dL — ABNORMAL HIGH (ref 0.50–1.35)
GFR calc Af Amer: 75 mL/min — ABNORMAL LOW (ref >90)
GFR calc non Af Amer: 65 mL/min — ABNORMAL LOW (ref >90)
Glucose, Bld: 123 mg/dL — ABNORMAL HIGH (ref 70–99)
POTASSIUM: 3.7 meq/L (ref 3.7–5.3)
Sodium: 140 mEq/L (ref 137–147)
Total Protein: 7.3 g/dL (ref 6.0–8.3)

## 2014-05-14 LAB — URINALYSIS, ROUTINE W REFLEX MICROSCOPIC
Glucose, UA: NEGATIVE mg/dL
HGB URINE DIPSTICK: NEGATIVE
Ketones, ur: 15 mg/dL — AB
NITRITE: NEGATIVE
Protein, ur: 30 mg/dL — AB
SPECIFIC GRAVITY, URINE: 1.031 — AB (ref 1.005–1.030)
Urobilinogen, UA: 4 mg/dL — ABNORMAL HIGH (ref 0.0–1.0)
pH: 7 (ref 5.0–8.0)

## 2014-05-14 LAB — HEPATIC FUNCTION PANEL
ALBUMIN: 3.4 g/dL — AB (ref 3.5–5.2)
ALT: 655 U/L — ABNORMAL HIGH (ref 0–53)
AST: 568 U/L — AB (ref 0–37)
Alkaline Phosphatase: 132 U/L — ABNORMAL HIGH (ref 39–117)
BILIRUBIN DIRECT: 3.5 mg/dL — AB (ref 0.0–0.3)
BILIRUBIN TOTAL: 5 mg/dL — AB (ref 0.3–1.2)
Indirect Bilirubin: 1.5 mg/dL — ABNORMAL HIGH (ref 0.3–0.9)
Total Protein: 6.5 g/dL (ref 6.0–8.3)

## 2014-05-14 LAB — PROTIME-INR
INR: 1.09 (ref 0.00–1.49)
PROTHROMBIN TIME: 14.1 s (ref 11.6–15.2)

## 2014-05-14 LAB — URINE MICROSCOPIC-ADD ON

## 2014-05-14 LAB — LIPASE, BLOOD: Lipase: 76 U/L — ABNORMAL HIGH (ref 11–59)

## 2014-05-14 LAB — GLUCOSE, CAPILLARY
GLUCOSE-CAPILLARY: 79 mg/dL (ref 70–99)
GLUCOSE-CAPILLARY: 67 mg/dL — AB (ref 70–99)

## 2014-05-14 MED ORDER — SODIUM CHLORIDE 0.9 % IV SOLN
INTRAVENOUS | Status: AC
  Administered 2014-05-14: 11:00:00 via INTRAVENOUS

## 2014-05-14 MED ORDER — DEXTROSE-NACL 5-0.9 % IV SOLN
INTRAVENOUS | Status: DC
  Administered 2014-05-14 – 2014-05-16 (×2): via INTRAVENOUS

## 2014-05-14 MED ORDER — ONDANSETRON HCL 4 MG/2ML IJ SOLN
4.0000 mg | Freq: Once | INTRAMUSCULAR | Status: AC
  Administered 2014-05-14: 4 mg via INTRAVENOUS
  Filled 2014-05-14: qty 2

## 2014-05-14 MED ORDER — ONDANSETRON HCL 4 MG PO TABS: 4.0000 mg | ORAL_TABLET | Freq: Four times a day (QID) | ORAL | Status: DC | PRN

## 2014-05-14 MED ORDER — SODIUM CHLORIDE 0.9 % IV BOLUS (SEPSIS)
1000.0000 mL | Freq: Once | INTRAVENOUS | Status: AC
  Administered 2014-05-14: 1000 mL via INTRAVENOUS

## 2014-05-14 MED ORDER — ONDANSETRON HCL 4 MG/2ML IJ SOLN: 4.0000 mg | Freq: Four times a day (QID) | INTRAMUSCULAR | Status: DC | PRN

## 2014-05-14 MED ORDER — HYDRALAZINE HCL 20 MG/ML IJ SOLN
10.0000 mg | INTRAMUSCULAR | Status: DC | PRN
  Administered 2014-05-16: 10 mg via INTRAVENOUS
  Filled 2014-05-14: qty 1

## 2014-05-14 MED ORDER — LABETALOL HCL 5 MG/ML IV SOLN
10.0000 mg | INTRAVENOUS | Status: DC
  Administered 2014-05-14 – 2014-05-15 (×7): 10 mg via INTRAVENOUS
  Filled 2014-05-14 (×27): qty 4

## 2014-05-14 MED ORDER — MORPHINE SULFATE 4 MG/ML IJ SOLN
4.0000 mg | Freq: Once | INTRAMUSCULAR | Status: AC
  Administered 2014-05-14: 4 mg via INTRAVENOUS
  Filled 2014-05-14: qty 1

## 2014-05-14 MED ORDER — HYDROMORPHONE HCL PF 1 MG/ML IJ SOLN
1.0000 mg | INTRAMUSCULAR | Status: AC | PRN
  Filled 2014-05-14: qty 1

## 2014-05-14 MED ORDER — ONDANSETRON HCL 4 MG/2ML IJ SOLN: 4.0000 mg | Freq: Three times a day (TID) | INTRAMUSCULAR | Status: DC | PRN

## 2014-05-14 MED ORDER — PIPERACILLIN-TAZOBACTAM 3.375 G IVPB
3.3750 g | Freq: Three times a day (TID) | INTRAVENOUS | Status: DC
  Administered 2014-05-14 – 2014-05-17 (×10): 3.375 g via INTRAVENOUS
  Filled 2014-05-14 (×12): qty 50

## 2014-05-14 MED ORDER — PIPERACILLIN-TAZOBACTAM 3.375 G IVPB
3.3750 g | Freq: Once | INTRAVENOUS | Status: AC
  Administered 2014-05-14: 3.375 g via INTRAVENOUS
  Filled 2014-05-14: qty 50

## 2014-05-14 MED ORDER — SODIUM CHLORIDE 0.9 % IV SOLN
INTRAVENOUS | Status: DC
  Administered 2014-05-14: 05:00:00 via INTRAVENOUS

## 2014-05-14 MED ORDER — PANTOPRAZOLE SODIUM 40 MG IV SOLR
40.0000 mg | INTRAVENOUS | Status: DC
  Administered 2014-05-15 – 2014-05-17 (×3): 40 mg via INTRAVENOUS
  Filled 2014-05-14 (×4): qty 40

## 2014-05-14 MED ORDER — ACETAMINOPHEN 650 MG RE SUPP: 650.0000 mg | Freq: Four times a day (QID) | RECTAL | Status: DC | PRN

## 2014-05-14 MED ORDER — ACETAMINOPHEN 325 MG PO TABS: 650.0000 mg | ORAL_TABLET | Freq: Four times a day (QID) | ORAL | Status: DC | PRN

## 2014-05-14 NOTE — ED Provider Notes (Signed)
CSN: [id number]     Arrival date & time 05/13/14  2339 History   First MD Initiated Contact with Patient 05/14/14 0202     Chief Complaint  Patient presents with  . Abdominal Pain     (Consider location/radiation/quality/duration/timing/severity/associated sxs/prior Treatment) HPI Comments: Patient presents with left upper quadrant epigastric pain that onset around 7:30 PM has been constant. It is associated with nausea and vomiting. Denies any fever, urinary symptoms. No chest pain or shortness of breath. Patient seen yesterday for same pain. He had a CT scan that showed gallstones. Patient states felt better when he left. History of previous stroke with left-sided weakness. Denies any urinary symptoms denies any chest pain, shortness of breath or back pain. The pain is constant nothing makes it better or worse. It is somewhat improved by laying on his left side.  The history is provided by the patient.    Past Medical History  Diagnosis Date  . Hypertension 06/26/2013    new dx  . Seasonal allergies     in summer and spring.  . Stroke 06/26/2013    "left me w/weakness on my left side" (08/27/2013)  . 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 Vertigo   . Cholelithiasis   . Ejection fraction    Past Surgical History  Procedure Laterality Date  . No past surgeries     Family History  Problem Relation Age of Onset  . Hypertension Father   . Diabetes Maternal Grandmother   . Stroke Paternal Grandfather   . Hypertension      strong family history of difficult HTN   History  Substance Use Topics  . Smoking status: Never Smoker   . Smokeless tobacco: Never Used  . Alcohol Use: 0.0 oz/week     Comment: 08/27/2013 "glass of wine very rarely"    Review of Systems  Constitutional: Positive for activity change and appetite change. Negative for fever.  HENT: Negative for congestion and rhinorrhea.   Eyes: Negative for visual disturbance.  Respiratory: Negative for cough, chest  tightness and shortness of breath.   Cardiovascular: Negative for chest pain.  Gastrointestinal: Positive for nausea, vomiting and abdominal pain. Negative for diarrhea and constipation.  Genitourinary: Negative for dysuria and hematuria.  Musculoskeletal: Negative for arthralgias and myalgias.  Skin: Negative for rash.  Neurological: Negative for dizziness, weakness and headaches.  A complete 10 system review of systems was obtained and all systems are negative except as noted in the HPI and PMH.      Allergies  Peanuts and Naproxen  Home Medications   Prior to Admission medications   Medication Sig Start Date End Date Taking? Authorizing Provider  amLODipine (NORVASC) 10 MG tablet Take 1 tablet (10 mg total) by mouth at bedtime. 07/19/13  Yes Evlyn Kanner Love, PA-C  famotidine (PEPCID) 20 MG tablet Take 1 tablet (20 mg total) by mouth 2 (two) times daily. 05/13/14  Yes Tiffany Irine Seal, PA-C  hydrochlorothiazide (HYDRODIURIL) 25 MG tablet Take 1 tablet (25 mg total) by mouth daily. 07/19/13  Yes Evlyn Kanner Love, PA-C  HYDROcodone-acetaminophen (NORCO/VICODIN) 5-325 MG per tablet Take 1-2 tablets by mouth every 4 (four) hours as needed. 05/13/14  Yes Tiffany Irine Seal, PA-C  labetalol (NORMODYNE) 300 MG tablet Take 1 tablet (300 mg total) by mouth 2 (two) times daily. 07/19/13  Yes Evlyn Kanner Love, PA-C  losartan (COZAAR) 50 MG tablet Take 1 tablet (50 mg total) by mouth daily. 11/04/13  Yes Luis Abed, MD  BP 125/81  Pulse 60  Temp(Src) 98.4 F (36.9 C) (Oral)  Resp 14  Ht 5\' 11"  (1.803 m)  Wt 177 lb 11.2 oz (80.604 kg)  BMI 24.80 kg/m2  SpO2 100% Physical Exam  Nursing note and vitals reviewed. Constitutional: He is oriented to person, place, and time. He appears well-developed and well-nourished. No distress.  Questionably jaundiced  HENT:  Head: Normocephalic and atraumatic.  Mouth/Throat: Oropharynx is clear and moist. No oropharyngeal exudate.  Eyes: Conjunctivae and EOM are  normal. Pupils are equal, round, and reactive to light.  Neck: Normal range of motion. Neck supple.  No meningismus.  Cardiovascular: Normal rate, regular rhythm, normal heart sounds and intact distal pulses.   No murmur heard. Pulmonary/Chest: Effort normal and breath sounds normal. No respiratory distress.  Abdominal: Soft. There is tenderness. There is no rebound and no guarding.  TTP LUQ and epigastrium  Musculoskeletal: Normal range of motion. He exhibits no edema and no tenderness.  No CVAT  Neurological: He is alert and oriented to person, place, and time. No cranial nerve deficit. He exhibits normal muscle tone. Coordination normal.  No ataxia on finger to nose bilaterally. No pronator drift. 5/5 strength throughout. CN 2-12 intact. Negative Romberg. Equal grip strength. Sensation intact. Gait is normal.   Skin: Skin is warm.  Psychiatric: He has a normal mood and affect. His behavior is normal.    ED Course  Procedures (including critical care time) Labs Review Labs Reviewed  CBC WITH DIFFERENTIAL - Abnormal; Notable for the following:    Neutrophils Relative % 80 (*)    All other components within normal limits  URINALYSIS, ROUTINE W REFLEX MICROSCOPIC - Abnormal; Notable for the following:    Color, Urine AMBER (*)    Specific Gravity, Urine 1.031 (*)    Bilirubin Urine LARGE (*)    Ketones, ur 15 (*)    Protein, ur 30 (*)    Urobilinogen, UA 4.0 (*)    Leukocytes, UA TRACE (*)    All other components within normal limits  COMPREHENSIVE METABOLIC PANEL - Abnormal; Notable for the following:    Glucose, Bld 123 (*)    Creatinine, Ser 1.44 (*)    AST 746 (*)    ALT 691 (*)    Alkaline Phosphatase 120 (*)    Total Bilirubin 4.6 (*)    GFR calc non Af Amer 65 (*)    GFR calc Af Amer 75 (*)    All other components within normal limits  LIPASE, BLOOD - Abnormal; Notable for the following:    Lipase 76 (*)    All other components within normal limits  URINE  MICROSCOPIC-ADD ON - Abnormal; Notable for the following:    Bacteria, UA FEW (*)    All other components within normal limits  HEPATIC FUNCTION PANEL  PROTIME-INR  CBC WITH DIFFERENTIAL  BASIC METABOLIC PANEL    Imaging Review Dg Chest 2 View  05/13/2014   CLINICAL DATA:  Pain.  EXAM: CHEST  2 VIEW  COMPARISON:  08/27/2013.  FINDINGS: The heart size and mediastinal contours are within normal limits. Both lungs are clear. The visualized skeletal structures are unremarkable.  IMPRESSION: No active cardiopulmonary disease.  Unchanged from prior exam.   Electronically Signed   By: Maisie Fushomas  Register   On: 05/13/2014 07:27   Ct Abdomen Pelvis W Contrast  05/13/2014   CLINICAL DATA:  28 year old with acute onset of left upper quadrant pain.  EXAM: CT ABDOMEN AND PELVIS WITH CONTRAST  TECHNIQUE: Multidetector CT imaging of the abdomen and pelvis was performed using the standard protocol following bolus administration of intravenous contrast.  CONTRAST:  OMNIPAQUE IOHEXOL 300 MG/ML  SOLN  COMPARISON:  None.  FINDINGS: The lung bases are clear.  No evidence for free air.  Normal appearance of the liver and portal venous system. The gallbladder is non distended but difficult to exclude mild gallbladder wall thickening. Normal appearance of the pancreas, spleen, adrenal glands and both kidneys. No significant free fluid or lymphadenopathy. Normal appearance of the prostate and urinary bladder. There is a retrocecal appendix without acute inflammation. There is mild dilatation of the small bowel loops in the mid abdomen. There is no significant bowel wall thickening.  No acute bone abnormality.  IMPRESSION: No acute abnormalities within the abdomen or pelvis.  Mild dilatation of small bowel loops without surrounding inflammatory changes or wall thickening. The small bowel findings are nonspecific.  Question mild gallbladder wall thickening without gallbladder distention. If this is the area of clinical  concern, consider further evaluation with ultrasound.   Electronically Signed   By: Richarda Overlie M.D.   On: 05/13/2014 09:54   US Abdomen Limited Ruq  05/14/2014   CLINICAL DATA:  Right upper quadrant pain.  EXAM: US ABDOMEN LIMITED - RIGHT UPPER QUADRANT  COMPARISON:  CT of the abdomen and pelvis May 13, 2014  FINDINGS: Gallbladder:  Multiple echogenic gallstones with acoustic shadowing measuring up to 11 mm. Gallbladder wall is 3 mm. No pericholecystic fluid. No sonographic Murphy's sign was elicited.  Common bile duct:  Diameter: 4 mm  Liver:  No focal lesion identified. Within normal limits in parenchymal echogenicity. Hepatopetal portal vein.  IMPRESSION: Cholelithiasis, top normal gallbladder wall thickness without corroborative findings of acute cholecystitis.   Electronically Signed   By: Awilda Metro   On: 05/14/2014 02:01     EKG Interpretation None      MDM   Final diagnoses:  Choledocholithiasis   left upper quadrant epigastric pain with nausea and vomiting. Seen yesterday for same with CT scan that showed gallbladder wall thickening.  LFTs today significantly elevated as compared to yesterday. Bilirubin 4.1. Ultrasound shows cholelithiasis without evidence of cholecystitis.  Concern for choledocholithiasis. IV Zosyn given though cholangitis seems less likely. Patient with no fever, no right upper quadrant pain.  Discussed with Dr. Toniann Fail who will admit. Dr. Magnus Ivan from surgery will consult. Patient likely needs ERCP/MRCP.  Glynn Octave, MD 05/14/14 [phone]

## 2014-05-14 NOTE — Progress Notes (Signed)
ANTIBIOTIC CONSULT NOTE - INITIAL  Pharmacy Consult for zosyn  Indication: intra-abdominal infection  Allergies  Allergen Reactions  . Peanuts [Peanut Oil] Anaphylaxis  . Naproxen Rash    Patient Measurements: Weight: 185 lb (83.915 kg)  Vital Signs: Temp: 98.4 F (36.9 C) (08/19 0500) Temp src: Oral (08/19 0500) BP: 125/81 mmHg (08/19 0500) Pulse Rate: 60 (08/19 0500) Intake/Output from previous day:   Intake/Output from this shift:    Labs:  Recent Labs  05/13/14 0525 05/13/14 2357 05/14/14 0010  WBC 6.1  --  5.8  HGB 14.1  --  14.6  PLT 244  --  259  CREATININE 1.49* 1.44*  --    The CrCl is unknown because both a height and weight (above a minimum accepted value) are required for this calculation. No results found for this basename: VANCOTROUGH, VANCOPEAK, VANCORANDOM, GENTTROUGH, GENTPEAK, GENTRANDOM, TOBRATROUGH, TOBRAPEAK, TOBRARND, AMIKACINPEAK, AMIKACINTROU, AMIKACIN,  in the last 72 hours   Microbiology: No results found for this or any previous visit (from the past 720 hour(s)).  Medical History: Past Medical History  Diagnosis Date  . Hypertension 06/26/2013    new dx  . Seasonal allergies     in summer and spring.  . Stroke 06/26/2013    "left me w/weakness on my left side" (08/27/2013)  . 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 Vertigo   . Cholelithiasis   . Ejection fraction    Assessment: 28 year old male presents with abdominal pain with CT positive for gallbladder wall thickening. Orders received to continue IV zosyn for GI coverage. Scr 1.4, no dose adjustments needed. Pharmacy to sign off and follow peripherally.  Plan:  Zosyn 3.375g IV q8 hours  Sheppard CoilFrank Harjot Dibello PharmD., BCPS Clinical Pharmacist Pager [phone] 05/14/2014 6:10 AM

## 2014-05-14 NOTE — ED Notes (Signed)
Report attempted 

## 2014-05-14 NOTE — Consult Note (Signed)
Unassigned GI consult  Reason for Consult: Elevated Liver Enzymes, Gallstones, and LUQ pain Referring Physician: [facility] [place] HPI: This is a 28 year old male with a PMH of ICH, CVA, HTN, and cholelithiasis who is admitted for recurrent LUQ abdominal pain.  The patient was in the ER on Tuesday with similar complaints, but his symptoms resolved.  Unfortunately he started to have a recurrence his symptoms a few hours after his discharge, and this time his liver enzymes increased as well as his TB.  He was evaluated in the hospital on 08/2013 with the similar symptoms.  Aside from the cholelithiasis no other abnormalities were noted and he was supposed to follow up with Surgery as an outpatient.  The CT scan and the current [place] do no reveal any evidence of CBD dilation.  In fact, the CBD is measured at 4 mm with the [place].  Past Medical History  Diagnosis Date  . Hypertension 06/26/2013    new dx  . Seasonal allergies     in summer and spring.  . Stroke 06/26/2013    "left me w/weakness on my left side" (08/27/2013)  . 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 Vertigo   . Cholelithiasis   . Ejection fraction     Past Surgical History  Procedure Laterality Date  . No past surgeries      Family History  Problem Relation Age of Onset  . Hypertension Father   . Diabetes Maternal Grandmother   . Stroke Paternal Grandfather   . Hypertension      strong family history of difficult HTN    Social History:  reports that he has never smoked. He has never used smokeless tobacco. He reports that he drinks alcohol. He reports that he does not use illicit drugs.  Allergies:  Allergies  Allergen Reactions  . Peanuts [Peanut Oil] Anaphylaxis  . Naproxen Rash    Medications:  Scheduled: . labetalol  10 mg Intravenous Q2H  . pantoprazole (PROTONIX) IV  40 mg Intravenous Q24H  . piperacillin-tazobactam (ZOSYN)  IV  3.375 g Intravenous 3 times per day   Continuous: .  sodium chloride      Results for orders placed during the hospital encounter of 05/14/14 (from the past 24 hour(s))  COMPREHENSIVE METABOLIC PANEL     Status: Abnormal   Collection Time    05/13/14 11:57 PM      Result Value Ref Range   Sodium 140  137 - 147 mEq/L   Potassium 3.7  3.7 - 5.3 mEq/L   Chloride 100  96 - 112 mEq/L   CO2 28  19 - 32 mEq/L   Glucose, Bld 123 (*) 70 - 99 mg/dL   BUN 16  6 - 23 mg/dL   Creatinine, Ser 1.61 (*) 0.50 - 1.35 mg/dL   Calcium 9.6  8.4 - 09.6 mg/dL   Total Protein 7.3  6.0 - 8.3 g/dL   Albumin 3.9  3.5 - 5.2 g/dL   AST 045 (*) 0 - 37 U/L   ALT 691 (*) 0 - 53 U/L   Alkaline Phosphatase 120 (*) 39 - 117 U/L   Total Bilirubin 4.6 (*) 0.3 - 1.2 mg/dL   GFR calc non Af Amer 65 (*) >90 mL/min   GFR calc Af Amer 75 (*) >90 mL/min   Anion gap 12  5 - 15  LIPASE, BLOOD     Status: Abnormal   Collection Time    05/13/14 11:57 PM  Result Value Ref Range   Lipase 76 (*) 11 - 59 U/L  CBC WITH DIFFERENTIAL     Status: Abnormal   Collection Time    05/14/14 12:10 AM      Result Value Ref Range   WBC 5.8  4.0 - 10.5 K/uL   RBC 4.76  4.22 - 5.81 MIL/uL   Hemoglobin 14.6  13.0 - 17.0 g/dL   HCT 11.9  14.7 - 82.9 %   MCV 87.4  78.0 - 100.0 fL   MCH 30.7  26.0 - 34.0 pg   MCHC 35.1  30.0 - 36.0 g/dL   RDW 56.2  13.0 - 86.5 %   Platelets 259  150 - 400 K/uL   Neutrophils Relative % 80 (*) 43 - 77 %   Neutro Abs 4.6  1.7 - 7.7 K/uL   Lymphocytes Relative 13  12 - 46 %   Lymphs Abs 0.7  0.7 - 4.0 K/uL   Monocytes Relative 6  3 - 12 %   Monocytes Absolute 0.3  0.1 - 1.0 K/uL   Eosinophils Relative 1  0 - 5 %   Eosinophils Absolute 0.0  0.0 - 0.7 K/uL   Basophils Relative 0  0 - 1 %   Basophils Absolute 0.0  0.0 - 0.1 K/uL     Dg Chest 2 View  05/13/2014   CLINICAL DATA:  Pain.  EXAM: CHEST  2 VIEW  COMPARISON:  08/27/2013.  FINDINGS: The heart size and mediastinal contours are within normal limits. Both lungs are clear. The visualized skeletal  structures are unremarkable.  IMPRESSION: No active cardiopulmonary disease.  Unchanged from prior exam.   Electronically Signed   By: Maisie Fus  Register   On: 05/13/2014 07:27   Ct Abdomen Pelvis W Contrast  05/13/2014   CLINICAL DATA:  28 year old with acute onset of left upper quadrant pain.  EXAM: CT ABDOMEN AND PELVIS WITH CONTRAST  TECHNIQUE: Multidetector CT imaging of the abdomen and pelvis was performed using the standard protocol following bolus administration of intravenous contrast.  CONTRAST:  OMNIPAQUE IOHEXOL 300 MG/ML  SOLN  COMPARISON:  None.  FINDINGS: The lung bases are clear.  No evidence for free air.  Normal appearance of the liver and portal venous system. The gallbladder is non distended but difficult to exclude mild gallbladder wall thickening. Normal appearance of the pancreas, spleen, adrenal glands and both kidneys. No significant free fluid or lymphadenopathy. Normal appearance of the prostate and urinary bladder. There is a retrocecal appendix without acute inflammation. There is mild dilatation of the small bowel loops in the mid abdomen. There is no significant bowel wall thickening.  No acute bone abnormality.  IMPRESSION: No acute abnormalities within the abdomen or pelvis.  Mild dilatation of small bowel loops without surrounding inflammatory changes or wall thickening. The small bowel findings are nonspecific.  Question mild gallbladder wall thickening without gallbladder distention. If this is the area of clinical concern, consider further evaluation with ultrasound.   Electronically Signed   By: Richarda Overlie M.D.   On: 05/13/2014 09:54   US Abdomen Limited Ruq  05/14/2014   CLINICAL DATA:  Right upper quadrant pain.  EXAM: US ABDOMEN LIMITED - RIGHT UPPER QUADRANT  COMPARISON:  CT of the abdomen and pelvis May 13, 2014  FINDINGS: Gallbladder:  Multiple echogenic gallstones with acoustic shadowing measuring up to 11 mm. Gallbladder wall is 3 mm. No pericholecystic  fluid. No sonographic Murphy's sign was elicited.  Common bile duct:  Diameter: 4 mm  Liver:  No focal lesion identified. Within normal limits in parenchymal echogenicity. Hepatopetal portal vein.  IMPRESSION: Cholelithiasis, top normal gallbladder wall thickness without corroborative findings of acute cholecystitis.   Electronically Signed   By: Awilda Metroourtnay  Bloomer   On: 05/14/2014 02:01    ROS:  As stated above in the HPI otherwise negative.  Blood pressure 125/81, pulse 60, temperature 98.4 F (36.9 C), temperature source Oral, resp. rate 14, height 5\' 11"  (1.803 m), weight 177 lb 11.2 oz (80.604 kg), SpO2 100.00%.    PE: Gen: NAD, Alert and Oriented HEENT:  [place]/AT, EOMI Neck: Supple, no LAD Lungs: CTA Bilaterally CV: RRR without M/G/R ABM: Soft, NTND, +BS Ext: No C/C/E  Assessment/Plan: 1) Symptomatic cholelithiasis. 2) Elevated liver enzymes.   The patient appears to have symptomatic cholelithiasis and he is comfortable at this time. His abdominal pain has dropped down to a discomfort.  This can cause an enzyme elevation as well as the TB and there is questionable gallbladder wall thickening.  The plan is for a lap chole, but there is the possibility of cholelithiasis.  I think it is reasonable to rule out this etiology with an MRCP before considering an ERCP.  If he does have stones, he is at high risk for post-ERCP pancreatitis as his CBD is only measured to be 4 mm with the US.  A CBD of less than 8-9 mm places the patient at risk for post-ERCP pancreatitis.  Plan: 1) MRCP and ERCP if there is choledocholithiasis. 2) Follow liver enzymes.  Korey Prashad D 05/14/2014, 7:59 AM

## 2014-05-14 NOTE — Progress Notes (Signed)
Patient ID: Paul Reeves, male   DOB: Jan 29, 1986, 28 y.o.   MRN: [id number]     [facility]      [address] [place], [place] [address]    Phone: [phone] FAX: [phone]     Subjective: No complaints.  VSS.  Bilirubin 4.6.  Normal white count.    Objective:  Vital signs:  Filed Vitals:   05/14/14 0300 05/14/14 0330 05/14/14 0400 05/14/14 0500  BP: 126/88 123/76 125/75 125/81  Pulse: 57 54 52 60  Temp:    98.4 F (36.9 C)  TempSrc:    Oral  Resp: _0 Height:    _1  (1.803 m)  Weight:    177 lb 11.2 oz (80.604 kg)  SpO2: 99% 99% 98% 100%    Last BM Date: 05/13/14  Intake/Output   Yesterday:    This shift:        Physical Exam: General: Pt awake/alert/oriented x4 in no acute distress Abdomen: Soft.  Nondistended.  Non tender.  No evidence of peritonitis.  No incarcerated hernias.   Problem List:   Principal Problem:   Abdominal pain Active Problems:   Hypertension   Choledocholithiasis   Elevated LFTs    Results:   Labs: Results for orders placed during the hospital encounter of 05/14/14 (from the past 48 hour(s))  COMPREHENSIVE METABOLIC PANEL     Status: Abnormal   Collection Time    05/13/14 11:57 PM      Result Value Ref Range   Sodium 140  137 - 147 mEq/L   Potassium 3.7  3.7 - 5.3 mEq/L   Chloride 100  96 - 112 mEq/L   CO2 28  19 - 32 mEq/L   Glucose, Bld 123 (*) 70 - 99 mg/dL   BUN 16  6 - 23 mg/dL   Creatinine, Ser 1.44 (*) 0.50 - 1.35 mg/dL   Calcium 9.6  8.4 - 10.5 mg/dL   Total Protein 7.3  6.0 - 8.3 g/dL   Albumin 3.9  3.5 - 5.2 g/dL   AST 746 (*) 0 - 37 U/L   ALT 691 (*) 0 - 53 U/L   Alkaline Phosphatase 120 (*) 39 - 117 U/L   Total Bilirubin 4.6 (*) 0.3 - 1.2 mg/dL   GFR calc non Af Amer 65 (*) >90 mL/min   GFR calc Af Amer 75 (*) >90 mL/min   Comment: (NOTE)     The eGFR has been calculated using the CKD EPI equation.     This calculation has not been  validated in all clinical situations.     eGFR's persistently <90 mL/min signify possible Chronic Kidney     Disease.   Anion gap 12  5 - 15  LIPASE, BLOOD     Status: Abnormal   Collection Time    05/13/14 11:57 PM      Result Value Ref Range   Lipase 76 (*) 11 - 59 U/L  CBC WITH DIFFERENTIAL     Status: Abnormal   Collection Time    05/14/14 12:10 AM      Result Value Ref Range   WBC 5.8  4.0 - 10.5 K/uL   RBC 4.76  4.22 - 5.81 MIL/uL   Hemoglobin 14.6  13.0 - 17.0 g/dL   HCT 41.6  39.0 - 52.0 %   MCV 87.4  78.0 - 100.0 fL   MCH 30.7  26.0 - 34.0 pg  MCHC 35.1  30.0 - 36.0 g/dL   RDW 12.1  11.5 - 15.5 %   Platelets 259  150 - 400 K/uL   Neutrophils Relative % 80 (*) 43 - 77 %   Neutro Abs 4.6  1.7 - 7.7 K/uL   Lymphocytes Relative 13  12 - 46 %   Lymphs Abs 0.7  0.7 - 4.0 K/uL   Monocytes Relative 6  3 - 12 %   Monocytes Absolute 0.3  0.1 - 1.0 K/uL   Eosinophils Relative 1  0 - 5 %   Eosinophils Absolute 0.0  0.0 - 0.7 K/uL   Basophils Relative 0  0 - 1 %   Basophils Absolute 0.0  0.0 - 0.1 K/uL  URINALYSIS, ROUTINE W REFLEX MICROSCOPIC     Status: Abnormal   Collection Time    05/14/14  7:26 AM      Result Value Ref Range   Color, Urine AMBER (*) YELLOW   Comment: BIOCHEMICALS MAY BE AFFECTED BY COLOR   APPearance CLEAR  CLEAR   Specific Gravity, Urine 1.031 (*) 1.005 - 1.030   pH 7.0  5.0 - 8.0   Glucose, UA NEGATIVE  NEGATIVE mg/dL   Hgb urine dipstick NEGATIVE  NEGATIVE   Bilirubin Urine LARGE (*) NEGATIVE   Ketones, ur 15 (*) NEGATIVE mg/dL   Protein, ur 30 (*) NEGATIVE mg/dL   Urobilinogen, UA 4.0 (*) 0.0 - 1.0 mg/dL   Nitrite NEGATIVE  NEGATIVE   Leukocytes, UA TRACE (*) NEGATIVE  URINE MICROSCOPIC-ADD ON     Status: Abnormal   Collection Time    05/14/14  7:26 AM      Result Value Ref Range   Squamous Epithelial / LPF RARE  RARE   WBC, UA 0-2  <3 WBC/hpf   Bacteria, UA FEW (*) RARE    Imaging / Studies: Dg Chest 2 View  05/13/2014   CLINICAL  DATA:  Pain.  EXAM: CHEST  2 VIEW  COMPARISON:  08/27/2013.  FINDINGS: The heart size and mediastinal contours are within normal limits. Both lungs are clear. The visualized skeletal structures are unremarkable.  IMPRESSION: No active cardiopulmonary disease.  Unchanged from prior exam.   Electronically Signed   By: Marcello Moores  Register   On: 05/13/2014 07:27   Ct Abdomen Pelvis W Contrast  05/13/2014   CLINICAL DATA:  28 year old with acute onset of left upper quadrant pain.  EXAM: CT ABDOMEN AND PELVIS WITH CONTRAST  TECHNIQUE: Multidetector CT imaging of the abdomen and pelvis was performed using the standard protocol following bolus administration of intravenous contrast.  CONTRAST:  136m OMNIPAQUE IOHEXOL 300 MG/ML  SOLN  COMPARISON:  None.  FINDINGS: The lung bases are clear.  No evidence for free air.  Normal appearance of the liver and portal venous system. The gallbladder is non distended but difficult to exclude mild gallbladder wall thickening. Normal appearance of the pancreas, spleen, adrenal glands and both kidneys. No significant free fluid or lymphadenopathy. Normal appearance of the prostate and urinary bladder. There is a retrocecal appendix without acute inflammation. There is mild dilatation of the small bowel loops in the mid abdomen. There is no significant bowel wall thickening.  No acute bone abnormality.  IMPRESSION: No acute abnormalities within the abdomen or pelvis.  Mild dilatation of small bowel loops without surrounding inflammatory changes or wall thickening. The small bowel findings are nonspecific.  Question mild gallbladder wall thickening without gallbladder distention. If this is the area of clinical concern, consider  further evaluation with ultrasound.   Electronically Signed   By: Markus Daft M.D.   On: 05/13/2014 09:54   US Abdomen Limited Ruq  05/14/2014   CLINICAL DATA:  Right upper quadrant pain.  EXAM: US ABDOMEN LIMITED - RIGHT UPPER QUADRANT  COMPARISON:  CT of the  abdomen and pelvis May 13, 2014  FINDINGS: Gallbladder:  Multiple echogenic gallstones with acoustic shadowing measuring up to 11 mm. Gallbladder wall is 3 mm. No pericholecystic fluid. No sonographic Murphy's sign was elicited.  Common bile duct:  Diameter: 4 mm  Liver:  No focal lesion identified. Within normal limits in parenchymal echogenicity. Hepatopetal portal vein.  IMPRESSION: Cholelithiasis, top normal gallbladder wall thickness without corroborative findings of acute cholecystitis.   Electronically Signed   By: Elon Alas   On: 05/14/2014 02:01    Medications / Allergies:  Scheduled Meds: . labetalol  10 mg Intravenous Q2H  . pantoprazole (PROTONIX) IV  40 mg Intravenous Q24H  . piperacillin-tazobactam (ZOSYN)  IV  3.375 g Intravenous 3 times per day   Continuous Infusions: . sodium chloride     PRN Meds:.acetaminophen, acetaminophen, hydrALAZINE, HYDROmorphone (DILAUDID) injection, ondansetron (ZOFRAN) IV, ondansetron  Antibiotics: Anti-infectives   Start     Dose/Rate Route Frequency Ordered Stop   05/14/14 1000  piperacillin-tazobactam (ZOSYN) IVPB 3.375 g     3.375 g 12.5 mL/hr over 240 Minutes Intravenous 3 times per day 05/14/14 0605     05/14/14 0315  piperacillin-tazobactam (ZOSYN) IVPB 3.375 g     3.375 g 12.5 mL/hr over 240 Minutes Intravenous  Once 05/14/14 0305 05/14/14 0734      Assessment/Plan Symptomatic cholelithiasis Elevated liver enzymes  -MRCP today for suspicion of choledocholithiasis.  If negative, will proceed with a laparoscopic cholecystectomy likely tomorrow -zosyn -IV hydration -SCD, consider chemical VTE prophylaxis -repeat labs in AM  Erby Pian, [facility] Pager [phone](7A-4:30P) For consults and floor pages call [phone](7A-4:30P)  05/14/2014 8:43 AM

## 2014-05-14 NOTE — Progress Notes (Signed)
Repeat Tbili up to 5.0 MRCP today. Will perform lap chole when CBD stone cleared or ruled out  Wilmon ArmsMatthew K. Corliss Skainssuei, MD, [facility]  General/ Trauma Surgery  05/14/2014 10:50 AM

## 2014-05-14 NOTE — Progress Notes (Signed)
[facility] PROGRESS NOTE  Paul Reeves ZOX:[id number] DOB: 1986-09-19 DOA: 05/14/2014 PCP: Sissy Hoff, MD  Assessment/Plan: Principal Problem:   Abdominal pain Active Problems:   Hypertension   Choledocholithiasis   Elevated LFTs   Cholelithiasis/transaminitis Appreciate general surgery and GI input Possible laparoscopic cholecystectomy after MRCP completed Noted that patient is a high risk for post-ERCP pancreatitis    Hypertension patient on scheduled dose of labetalol 10 mg IV every 2 hourly with when necessary IV hydralazine 10 mg IV every 4 hourly for systolic blood pressure more than 140.   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 Patient is able to ambulate and does not have any significant residual deficit   Chronic kidney disease stage II - creatinine appears to be at baseline. Closely follow metabolic panel and intake output.    Code Status: full Family Communication: family updated about patient's clinical progress Disposition Plan:  As above    Brief narrative: year old male with a PMH of ICH, CVA, HTN, and cholelithiasis who is admitted for recurrent LUQ abdominal pain. The patient was in the ER on Tuesday with similar complaints, but his symptoms resolved. Unfortunately he started to have a recurrence his symptoms a few hours after his discharge, and this time his liver enzymes increased as well as his TB. He was evaluated in the hospital on 08/2013 with the similar symptoms. Aside from the cholelithiasis no other abnormalities were noted and he was supposed to follow up with Surgery as an outpatient. The CT scan and the current [place] do no reveal any evidence of CBD dilation. In fact, the CBD is measured at 4 mm with the [place].   Consultants: Gastroenterology General surgery  Procedures: None  Antibiotics: Unasyn  HPI/Subjective: Comfortable in no acute  distress  Objective: Filed Vitals:   05/14/14 0330 05/14/14 0400 05/14/14 0500 05/14/14 0954  BP: 123/76 125/75 125/81 120/79  Pulse: 54 52 60 57  Temp:   98.4 F (36.9 C) 98.7 F (37.1 C)  TempSrc:   Oral Oral  Resp: 13 14 14 16   Height:   5\' 11"  (1.803 m)   Weight:   80.604 kg (177 lb 11.2 oz)   SpO2: 99% 98% 100% 99%   No intake or output data in the 24 hours ending 05/14/14 1157  Exam:  General: alert & oriented x 3 In NAD  Cardiovascular: RRR, nl S1 s2  Respiratory: Decreased breath sounds at the bases, scattered rhonchi, no crackles  Abdomen: soft +BS NT/ND, no masses palpable  Extremities: No cyanosis and no edema      Data Reviewed: Basic Metabolic Panel:  Recent Labs Lab 05/13/14 0525 05/13/14 2357 05/14/14 0830  NA 141 140 144  K 3.4* 3.7 3.7  CL 102 100 103  CO2 28 28 28   GLUCOSE 124* 123* 87  BUN 18 16 14   CREATININE 1.49* 1.44* 1.43*  CALCIUM 10.1 9.6 9.2    Liver Function Tests:  Recent Labs Lab 05/13/14 0525 05/13/14 2357 05/14/14 0830  AST 59* 746* 568*  ALT 33 691* 655*  ALKPHOS 62 120* 132*  BILITOT 1.0 4.6* 5.0*  PROT 7.0 7.3 6.5  ALBUMIN 3.8 3.9 3.4*    Recent Labs Lab 05/13/14 0525 05/13/14 2357  LIPASE 51 76*   No results found for this basename: AMMONIA,  in the last 168 hours  CBC:  Recent Labs Lab 05/13/14 0525 05/14/14 0010 05/14/14 0830  WBC 6.1 5.8 5.3  NEUTROABS  --  4.6 3.4  HGB 14.1 14.6 13.6  HCT 40.4 41.6 39.2  MCV 87.3 87.4 87.5  PLT 244 259 247    Cardiac Enzymes: No results found for this basename: CKTOTAL, CKMB, CKMBINDEX, TROPONINI,  in the last 168 hours BNP (last 3 results) No results found for this basename: PROBNP,  in the last 8760 hours   CBG: No results found for this basename: GLUCAP,  in the last 168 hours  No results found for this or any previous visit (from the past 240 hour(s)).   Studies: Dg Chest 2 View  05/13/2014   CLINICAL DATA:  Pain.  EXAM: CHEST  2 VIEW   COMPARISON:  08/27/2013.  FINDINGS: The heart size and mediastinal contours are within normal limits. Both lungs are clear. The visualized skeletal structures are unremarkable.  IMPRESSION: No active cardiopulmonary disease.  Unchanged from prior exam.   Electronically Signed   By: Maisie Fus  Register   On: 05/13/2014 07:27   Ct Abdomen Pelvis W Contrast  05/13/2014   CLINICAL DATA:  28 year old with acute onset of left upper quadrant pain.  EXAM: CT ABDOMEN AND PELVIS WITH CONTRAST  TECHNIQUE: Multidetector CT imaging of the abdomen and pelvis was performed using the standard protocol following bolus administration of intravenous contrast.  CONTRAST:  OMNIPAQUE IOHEXOL 300 MG/ML  SOLN  COMPARISON:  None.  FINDINGS: The lung bases are clear.  No evidence for free air.  Normal appearance of the liver and portal venous system. The gallbladder is non distended but difficult to exclude mild gallbladder wall thickening. Normal appearance of the pancreas, spleen, adrenal glands and both kidneys. No significant free fluid or lymphadenopathy. Normal appearance of the prostate and urinary bladder. There is a retrocecal appendix without acute inflammation. There is mild dilatation of the small bowel loops in the mid abdomen. There is no significant bowel wall thickening.  No acute bone abnormality.  IMPRESSION: No acute abnormalities within the abdomen or pelvis.  Mild dilatation of small bowel loops without surrounding inflammatory changes or wall thickening. The small bowel findings are nonspecific.  Question mild gallbladder wall thickening without gallbladder distention. If this is the area of clinical concern, consider further evaluation with ultrasound.   Electronically Signed   By: Richarda Overlie M.D.   On: 05/13/2014 09:54   US Abdomen Limited Ruq  05/14/2014   CLINICAL DATA:  Right upper quadrant pain.  EXAM: US ABDOMEN LIMITED - RIGHT UPPER QUADRANT  COMPARISON:  CT of the abdomen and pelvis May 13, 2014   FINDINGS: Gallbladder:  Multiple echogenic gallstones with acoustic shadowing measuring up to 11 mm. Gallbladder wall is 3 mm. No pericholecystic fluid. No sonographic Murphy's sign was elicited.  Common bile duct:  Diameter: 4 mm  Liver:  No focal lesion identified. Within normal limits in parenchymal echogenicity. Hepatopetal portal vein.  IMPRESSION: Cholelithiasis, top normal gallbladder wall thickness without corroborative findings of acute cholecystitis.   Electronically Signed   By: Awilda Metro   On: 05/14/2014 02:01    Scheduled Meds: . labetalol  10 mg Intravenous Q2H  . pantoprazole (PROTONIX) IV  40 mg Intravenous Q24H  . piperacillin-tazobactam (ZOSYN)  IV  3.375 g Intravenous 3 times per day   Continuous Infusions: . sodium chloride 100 mL/hr at 05/14/14 1120    Principal Problem:   Abdominal pain Active Problems:   Hypertension   Choledocholithiasis   Elevated LFTs    Time spent: 40 minutes   Paul Reeves  [facility] Pager [phone]. If 7PM-7AM, please contact night-coverage at [email or web address], password [email or web address] 05/14/2014, 11:57 AM  LOS: 0 days

## 2014-05-14 NOTE — ED Provider Notes (Signed)
  Medical screening examination/treatment/procedure(s) were performed by non-physician practitioner and as supervising physician I was immediately available for consultation/collaboration.   EKG Interpretation   Date/Time:  Tuesday May 13 2014 05:11:18 EDT Ventricular Rate:  67 PR Interval:  178 QRS Duration: 82 QT Interval:  390 QTC Calculation: 412 R Axis:   86 Text Interpretation:  Sinus arrhythmia No significant change was found  Confirmed by Manus GunningANCOUR  MD, STEPHEN ([id number]) on 05/13/2014 5:34:07 AM         Paul Munchobert Jaizon Deroos, MD 05/14/14 [phone]

## 2014-05-14 NOTE — Consult Note (Signed)
Reason for Consult:symptomatic cholelithiasis Referring Physician: Dr. Lajuana Carry D [name] is an 28 y.o. male.  HPI: this is a gentleman with a history of cholelithiasis who presents again with symptomatic gallstones. He started having attacks of abdominal pain last December. He had been admitted for gallstones at that time but then did not followup with [place] in the office. On Monday of this week he developed sharp left upper quadrant abdominal pain. He presented to the ED and was found to have gallstones. His symptoms resolved. He returned last night with sharp left upper quadrant abdominal pain nausea and vomiting. This time his bilirubin had gone from normal to greater than 4. He was admitted to the hospital service and surgery has been consulted.  Currently he feels more comfortable still having some mild left upper quadrant discomfort.  Past Medical History  Diagnosis Date  . Hypertension 06/26/2013    new dx  . Seasonal allergies     in summer and spring.  . Stroke 06/26/2013    "left me w/weakness on my left side" (08/27/2013)  . 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 Vertigo   . Cholelithiasis   . Ejection fraction     Past Surgical History  Procedure Laterality Date  . No past surgeries      Family History  Problem Relation Age of Onset  . Hypertension Father   . Diabetes Maternal Grandmother   . Stroke Paternal Grandfather   . Hypertension      strong family history of difficult HTN    Social History:  reports that he has never smoked. He has never used smokeless tobacco. He reports that he drinks alcohol. He reports that he does not use illicit drugs.  Allergies:  Allergies  Allergen Reactions  . Peanuts [Peanut Oil] Anaphylaxis  . Naproxen Rash    Medications: I have reviewed the patient's current medications.  Results for orders placed during the hospital encounter of 05/14/14 (from the past 48 hour(s))  COMPREHENSIVE METABOLIC PANEL     Status:  Abnormal   Collection Time    05/13/14 11:57 PM      Result Value Ref Range   Sodium 140  137 - 147 mEq/L   Potassium 3.7  3.7 - 5.3 mEq/L   Chloride 100  96 - 112 mEq/L   CO2 28  19 - 32 mEq/L   Glucose, Bld 123 (*) 70 - 99 mg/dL   BUN 16  6 - 23 mg/dL   Creatinine, Ser 1.44 (*) 0.50 - 1.35 mg/dL   Calcium 9.6  8.4 - 10.5 mg/dL   Total Protein 7.3  6.0 - 8.3 g/dL   Albumin 3.9  3.5 - 5.2 g/dL   AST 746 (*) 0 - 37 U/L   ALT 691 (*) 0 - 53 U/L   Alkaline Phosphatase 120 (*) 39 - 117 U/L   Total Bilirubin 4.6 (*) 0.3 - 1.2 mg/dL   GFR calc non Af Amer 65 (*) >90 mL/min   GFR calc Af Amer 75 (*) >90 mL/min   Comment: (NOTE)     The eGFR has been calculated using the CKD EPI equation.     This calculation has not been validated in all clinical situations.     eGFR's persistently <90 mL/min signify possible Chronic Kidney     Disease.   Anion gap 12  5 - 15  LIPASE, BLOOD     Status: Abnormal   Collection Time    05/13/14 11:57 PM  Result Value Ref Range   Lipase 76 (*) 11 - 59 U/L  CBC WITH DIFFERENTIAL     Status: Abnormal   Collection Time    05/14/14 12:10 AM      Result Value Ref Range   WBC 5.8  4.0 - 10.5 K/uL   RBC 4.76  4.22 - 5.81 MIL/uL   Hemoglobin 14.6  13.0 - 17.0 g/dL   HCT 41.6  39.0 - 52.0 %   MCV 87.4  78.0 - 100.0 fL   MCH 30.7  26.0 - 34.0 pg   MCHC 35.1  30.0 - 36.0 g/dL   RDW 12.1  11.5 - 15.5 %   Platelets 259  150 - 400 K/uL   Neutrophils Relative % 80 (*) 43 - 77 %   Neutro Abs 4.6  1.7 - 7.7 K/uL   Lymphocytes Relative 13  12 - 46 %   Lymphs Abs 0.7  0.7 - 4.0 K/uL   Monocytes Relative 6  3 - 12 %   Monocytes Absolute 0.3  0.1 - 1.0 K/uL   Eosinophils Relative 1  0 - 5 %   Eosinophils Absolute 0.0  0.0 - 0.7 K/uL   Basophils Relative 0  0 - 1 %   Basophils Absolute 0.0  0.0 - 0.1 K/uL    Dg Chest 2 View  05/13/2014   CLINICAL DATA:  Pain.  EXAM: CHEST  2 VIEW  COMPARISON:  08/27/2013.  FINDINGS: The heart size and mediastinal contours  are within normal limits. Both lungs are clear. The visualized skeletal structures are unremarkable.  IMPRESSION: No active cardiopulmonary disease.  Unchanged from prior exam.   Electronically Signed   By: Marcello Moores  Register   On: 05/13/2014 07:27   Ct Abdomen Pelvis W Contrast  05/13/2014   CLINICAL DATA:  28 year old with acute onset of left upper quadrant pain.  EXAM: CT ABDOMEN AND PELVIS WITH CONTRAST  TECHNIQUE: Multidetector CT imaging of the abdomen and pelvis was performed using the standard protocol following bolus administration of intravenous contrast.  CONTRAST:  175m OMNIPAQUE IOHEXOL 300 MG/ML  SOLN  COMPARISON:  None.  FINDINGS: The lung bases are clear.  No evidence for free air.  Normal appearance of the liver and portal venous system. The gallbladder is non distended but difficult to exclude mild gallbladder wall thickening. Normal appearance of the pancreas, spleen, adrenal glands and both kidneys. No significant free fluid or lymphadenopathy. Normal appearance of the prostate and urinary bladder. There is a retrocecal appendix without acute inflammation. There is mild dilatation of the small bowel loops in the mid abdomen. There is no significant bowel wall thickening.  No acute bone abnormality.  IMPRESSION: No acute abnormalities within the abdomen or pelvis.  Mild dilatation of small bowel loops without surrounding inflammatory changes or wall thickening. The small bowel findings are nonspecific.  Question mild gallbladder wall thickening without gallbladder distention. If this is the area of clinical concern, consider further evaluation with ultrasound.   Electronically Signed   By: AMarkus DaftM.D.   On: 05/13/2014 09:54   [place] Limited Ruq  05/14/2014   CLINICAL DATA:  Right upper quadrant pain.  EXAM: [place]DOM[place] LIMITED - RIGHT UPPER QUADRANT  COMPARISON:  CT of the abdomen and pelvis May 13, 2014  FINDINGS: Gallbladder:  Multiple echogenic gallstones with acoustic shadowing  measuring up to 11 mm. Gallbladder wall is 3 mm. No pericholecystic fluid. No sonographic Murphy's sign was elicited.  Common bile duct:  Diameter: 4 mm  Liver:  No focal lesion identified. Within normal limits in parenchymal echogenicity. Hepatopetal portal vein.  IMPRESSION: Cholelithiasis, top normal gallbladder wall thickness without corroborative findings of acute cholecystitis.   Electronically Signed   By: Elon Alas   On: 05/14/2014 02:01    Review of Systems  All other systems reviewed and are negative.  Blood pressure 125/81, pulse 60, temperature 98.4 F (36.9 C), temperature source Oral, resp. rate 14, weight 185 lb (83.915 kg), SpO2 100.00%. Physical Exam  Constitutional: He is oriented to person, place, and time. He appears well-developed and well-nourished. No distress.  HENT:  Head: Normocephalic and atraumatic.  Right Ear: External ear normal.  Left Ear: External ear normal.  Nose: Nose normal.  Mouth/Throat: Oropharynx is clear and moist. No oropharyngeal exudate.  Eyes: Conjunctivae are normal. Pupils are equal, round, and reactive to light. Right eye exhibits no discharge. Left eye exhibits no discharge. No scleral icterus.  Neck: Normal range of motion. No tracheal deviation present.  Cardiovascular: Normal rate, regular rhythm, normal heart sounds and intact distal pulses.   No murmur heard. Respiratory: Effort normal and breath sounds normal. No respiratory distress. He has no wheezes.  GI: Soft. He exhibits no distension. There is no guarding.  Minimal epigastric tenderness with no guarding and no left or right upper quadrant tenderness  Musculoskeletal: Normal range of motion. He exhibits no edema and no tenderness.  Lymphadenopathy:    He has no cervical adenopathy.  Neurological: He is alert and oriented to person, place, and time.  Skin: Skin is warm and dry. No rash noted. He is not diaphoretic. No erythema.  Psychiatric: His behavior is normal.  Judgment normal.    Assessment/Plan: Symptomatic cholelithiasis with possible choledocholithiasis.  Given the elevation of his liver function tests, I believe gastroenterology needs to be consulted for consideration of possible ERCP. He will definitely need a laparoscopic cholecystectomy with cholangiogram this admission. I discussed the reasons for this with the patient in detail. He understands and wishes to proceed. We will follow with you.  Nekeya Briski A 05/14/2014, 6:05 AM

## 2014-05-14 NOTE — H&P (Addendum)
[facility] History and Physical  Draiden Mirsky Raiche WUJ:[id number] DOB: [birth year] DOA: 05/14/2014  Referring physician: ER physician. PCP: Sissy Hoff, MD   Chief Complaint: Abdominal pain.  HPI: Paul Reeves is a 28 y.o. 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 Patient has been having abdominal pain over the last 2 days and had originally come to the ER yesterday morning and had CT abdomen and pelvis which showed nothing acute and patient was discharged home. Patient came back later with persistent left upper quadrant pain with nausea vomiting. LFTs done now shows significant elevation and sonogram shows gallstones with no definite evidence of cholecystitis. On-call surgeon Dr. Magnus Ivan was consulted by the ED physician and this time patient will be admitted for further management. Patient otherwise denies any fever chills chest pain shortness of breath. Patient states his pain is more severe left upper quadrant which was at times episodic and happens house after eating. Denies any diarrhea.  Review of Systems: As presented in the history of presenting illness, rest negative.  Past Medical History  Diagnosis Date  . Hypertension 06/26/2013    new dx  . Seasonal allergies     in summer and spring.  . Stroke 06/26/2013    "left me w/weakness on my left side" (08/27/2013)  . 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 Vertigo   . Cholelithiasis   . Ejection fraction    Past Surgical History  Procedure Laterality Date  . No past surgeries     Social History:  reports that he has never smoked. He has never used smokeless tobacco. He reports that he drinks alcohol. He reports that he does not use illicit drugs. Where does patient live home. Can patient participate in ADLs? Yes.  Allergies  Allergen Reactions  . Peanuts  [Peanut Oil] Anaphylaxis  . Naproxen Rash    Family History:  Family History  Problem Relation Age of Onset  . Hypertension Father   . Diabetes Maternal Grandmother   . Stroke Paternal Grandfather   . Hypertension      strong family history of difficult HTN      Prior to Admission medications   Medication Sig Start Date End Date Taking? Authorizing Provider  amLODipine (NORVASC) 10 MG tablet Take 1 tablet (10 mg total) by mouth at bedtime. 07/19/13  Yes Evlyn Kanner Love, PA-C  famotidine (PEPCID) 20 MG tablet Take 1 tablet (20 mg total) by mouth 2 (two) times daily. 05/13/14  Yes Tiffany Irine Seal, PA-C  hydrochlorothiazide (HYDRODIURIL) 25 MG tablet Take 1 tablet (25 mg total) by mouth daily. 07/19/13  Yes Evlyn Kanner Love, PA-C  HYDROcodone-acetaminophen (NORCO/VICODIN) 5-325 MG per tablet Take 1-2 tablets by mouth every 4 (four) hours as needed. 05/13/14  Yes Tiffany Irine Seal, PA-C  labetalol (NORMODYNE) 300 MG tablet Take 1 tablet (300 mg total) by mouth 2 (two) times daily. 07/19/13  Yes Evlyn Kanner Love, PA-C  losartan (COZAAR) 50 MG tablet Take 1 tablet (50 mg total) by mouth daily. 11/04/13  Yes Luis Abed, MD    Physical Exam: Filed Vitals:   05/14/14 0300 05/14/14 0330 05/14/14 0400 05/14/14 0500  BP: 126/88 123/76 125/75 125/81  Pulse: 57 54 52 60  Temp:    98.4 F (36.9 C)  TempSrc:    Oral  Resp: 14 13 14 14   Weight:      SpO2: 99% 99%  98% 100%     General:  Well-developed and nourished.  Eyes: Anicteric no pallor.  ENT: No discharge from ears eyes nose mouth.  Neck: No mass felt.  Cardiovascular: S1-S2 heard.  Respiratory: No rhonchi or crepitations.  Abdomen: Soft nontender bowel sounds present. No guarding or rigidity.  Skin: No rash.  Musculoskeletal: No edema.  Psychiatric: Appears normal.  Neurologic: Alert awake oriented to time place and person. Moves all extremities. Patient has history of left-sided hemiparesis but has good strength today and  patient states his strength is improved since last stroke.  Labs on Admission:  Basic Metabolic Panel:  Recent Labs Lab 05/13/14 0525 05/13/14 2357  NA 141 140  K 3.4* 3.7  CL 102 100  CO2 28 28  GLUCOSE 124* 123*  BUN 18 16  CREATININE 1.49* 1.44*  CALCIUM 10.1 9.6   Liver Function Tests:  Recent Labs Lab 05/13/14 0525 05/13/14 2357  AST 59* 746*  ALT 33 691*  ALKPHOS 62 120*  BILITOT 1.0 4.6*  PROT 7.0 7.3  ALBUMIN 3.8 3.9    Recent Labs Lab 05/13/14 0525 05/13/14 2357  LIPASE 51 76*   No results found for this basename: AMMONIA,  in the last 168 hours CBC:  Recent Labs Lab 05/13/14 0525 05/14/14 0010  WBC 6.1 5.8  NEUTROABS  --  4.6  HGB 14.1 14.6  HCT 40.4 41.6  MCV 87.3 87.4  PLT 244 259   Cardiac Enzymes: No results found for this basename: CKTOTAL, CKMB, CKMBINDEX, TROPONINI,  in the last 168 hours  BNP (last 3 results) No results found for this basename: PROBNP,  in the last 8760 hours CBG: No results found for this basename: GLUCAP,  in the last 168 hours  Radiological Exams on Admission: Dg Chest 2 View  05/13/2014   CLINICAL DATA:  Pain.  EXAM: CHEST  2 VIEW  COMPARISON:  08/27/2013.  FINDINGS: The heart size and mediastinal contours are within normal limits. Both lungs are clear. The visualized skeletal structures are unremarkable.  IMPRESSION: No active cardiopulmonary disease.  Unchanged from prior exam.   Electronically Signed   By: Maisie Fushomas  Register   On: 05/13/2014 07:27   Ct Abdomen Pelvis W Contrast  05/13/2014   CLINICAL DATA:  28 year old with acute onset of left upper quadrant pain.  EXAM: CT ABDOMEN AND PELVIS WITH CONTRAST  TECHNIQUE: Multidetector CT imaging of the abdomen and pelvis was performed using the standard protocol following bolus administration of intravenous contrast.  CONTRAST:  100mL OMNIPAQUE IOHEXOL 300 MG/ML  SOLN  COMPARISON:  None.  FINDINGS: The lung bases are clear.  No evidence for free air.  Normal  appearance of the liver and portal venous system. The gallbladder is non distended but difficult to exclude mild gallbladder wall thickening. Normal appearance of the pancreas, spleen, adrenal glands and both kidneys. No significant free fluid or lymphadenopathy. Normal appearance of the prostate and urinary bladder. There is a retrocecal appendix without acute inflammation. There is mild dilatation of the small bowel loops in the mid abdomen. There is no significant bowel wall thickening.  No acute bone abnormality.  IMPRESSION: No acute abnormalities within the abdomen or pelvis.  Mild dilatation of small bowel loops without surrounding inflammatory changes or wall thickening. The small bowel findings are nonspecific.  Question mild gallbladder wall thickening without gallbladder distention. If this is the area of clinical concern, consider further evaluation with ultrasound.   Electronically Signed   By: Meriel PicaAdam  Henn M.D.  On: 05/13/2014 09:54   US Abdomen Limited Ruq  05/14/2014   CLINICAL DATA:  Right upper quadrant pain.  EXAM: US ABDOMEN LIMITED - RIGHT UPPER QUADRANT  COMPARISON:  CT of the abdomen and pelvis May 13, 2014  FINDINGS: Gallbladder:  Multiple echogenic gallstones with acoustic shadowing measuring up to 11 mm. Gallbladder wall is 3 mm. No pericholecystic fluid. No sonographic Murphy's sign was elicited.  Common bile duct:  Diameter: 4 mm  Liver:  No focal lesion identified. Within normal limits in parenchymal echogenicity. Hepatopetal portal vein.  IMPRESSION: Cholelithiasis, top normal gallbladder wall thickness without corroborative findings of acute cholecystitis.   Electronically Signed   By: Awilda Metro   On: 05/14/2014 02:01     Assessment/Plan Principal Problem:   Abdominal pain Active Problems:   Hypertension   Choledocholithiasis   Elevated LFTs   1. Abdominal pain with elevated LFTs and gallstones primarily suspect choledocholithiasis with possible gallstone  pancreatitis - patient has been kept n.p.o. [facility] consult gastroenterologist for further recommendations. Probably may need MRCP/ERCP. Continue Zosyn. Surgery to see patient in consult. Continue gentle hydration and pain relief medications. 2. Hypertension - at this time since patient is n.p.o. I have placed patient on scheduled dose of labetalol 10 mg IV every 2 hourly with when necessary IV hydralazine 10 mg IV every 4 hourly for systolic blood pressure more than 140. 3. 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 4. Chronic kidney disease stage II - creatinine appears to be at baseline. Closely follow metabolic panel and intake output.  Addendum - have discussed with Dr. Elnoria Howard. Dr. Elnoria Howard advised to get MRCP and he will be seeing patient in consult.  Code Status: Full code.  Family Communication: None.  Disposition Plan: Admit to inpatient.    Nahjae Hoeg N. [facility] Pager [phone].  If 7PM-7AM, please contact night-coverage [email or web address] Password [email or web address] 05/14/2014, 5:59 AM

## 2014-05-15 LAB — CBC
HCT: 38.9 % — ABNORMAL LOW (ref 39.0–52.0)
Hemoglobin: 13.3 g/dL (ref 13.0–17.0)
MCH: 30.5 pg (ref 26.0–34.0)
MCHC: 34.2 g/dL (ref 30.0–36.0)
MCV: 89.2 fL (ref 78.0–100.0)
Platelets: 223 10*3/uL (ref 150–400)
RBC: 4.36 MIL/uL (ref 4.22–5.81)
RDW: 12.4 % (ref 11.5–15.5)
WBC: 4.1 10*3/uL (ref 4.0–10.5)

## 2014-05-15 LAB — GLUCOSE, CAPILLARY
Glucose-Capillary: 76 mg/dL (ref 70–99)
Glucose-Capillary: 79 mg/dL (ref 70–99)
Glucose-Capillary: 83 mg/dL (ref 70–99)
Glucose-Capillary: 84 mg/dL (ref 70–99)

## 2014-05-15 LAB — COMPREHENSIVE METABOLIC PANEL
ALT: 451 U/L — AB (ref 0–53)
AST: 259 U/L — AB (ref 0–37)
Albumin: 3.2 g/dL — ABNORMAL LOW (ref 3.5–5.2)
Alkaline Phosphatase: 135 U/L — ABNORMAL HIGH (ref 39–117)
Anion gap: 10 (ref 5–15)
BUN: 13 mg/dL (ref 6–23)
CO2: 29 mEq/L (ref 19–32)
Calcium: 8.9 mg/dL (ref 8.4–10.5)
Chloride: 104 mEq/L (ref 96–112)
Creatinine, Ser: 1.55 mg/dL — ABNORMAL HIGH (ref 0.50–1.35)
GFR calc Af Amer: 68 mL/min — ABNORMAL LOW (ref >90)
GFR calc non Af Amer: 59 mL/min — ABNORMAL LOW (ref >90)
Glucose, Bld: 90 mg/dL (ref 70–99)
Potassium: 3.3 mEq/L — ABNORMAL LOW (ref 3.7–5.3)
SODIUM: 143 meq/L (ref 137–147)
TOTAL PROTEIN: 6.3 g/dL (ref 6.0–8.3)
Total Bilirubin: 4.4 mg/dL — ABNORMAL HIGH (ref 0.3–1.2)

## 2014-05-15 MED ORDER — POTASSIUM CHLORIDE IN NACL 40-0.9 MEQ/L-% IV SOLN
INTRAVENOUS | Status: AC
  Administered 2014-05-15 (×2): 75 mL/h via INTRAVENOUS
  Filled 2014-05-15 (×2): qty 1000

## 2014-05-15 MED ORDER — LABETALOL HCL 5 MG/ML IV SOLN
10.0000 mg | Freq: Four times a day (QID) | INTRAVENOUS | Status: DC | PRN
  Administered 2014-05-15: 10 mg via INTRAVENOUS
  Filled 2014-05-15: qty 4

## 2014-05-15 NOTE — Progress Notes (Signed)
[facility] PROGRESS NOTE  Leeroy ChaMiguel D Prinsen ZOX:[id number] DOB: 05-01-1986 DOA: 05/14/2014 PCP: Sissy HoffSWAYNE,DAVID W, MD  Assessment/Plan: Principal Problem:   Abdominal pain Active Problems:   Hypertension   Choledocholithiasis   Elevated LFTs    Cholelithiasis/transaminitis  Appreciate general surgery and GI input  Possible laparoscopic cholecystectomy vs ERCP as per Dr. Elnoria Howardhung Noted that patient is a high risk for post-ERCP pancreatitis  MRCP results pending, apparently no choledocholithiasis -zosyn  -IV hydration  Followup CMP, n.p.o. 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 Patient is able to ambulate and does not have any significant residual deficit   Chronic kidney disease stage II - creatinine appears to be at baseline. Closely follow metabolic panel and intake output.   Code Status: full  Family Communication: family updated about patient's clinical progress  Disposition Plan: As above  Brief narrative:  year old male with a PMH of ICH, CVA, HTN, and cholelithiasis who is admitted for recurrent LUQ abdominal pain. The patient was in the ER on Tuesday with similar complaints, but his symptoms resolved. Unfortunately he started to have a recurrence his symptoms a few hours after his discharge, and this time his liver enzymes increased as well as his TB. He was evaluated in the hospital on 08/2013 with the similar symptoms. Aside from the cholelithiasis no other abnormalities were noted and he was supposed to follow up with Surgery as an outpatient. The CT scan and the current US do no reveal any evidence of CBD dilation. In fact, the CBD is measured at 4 mm with the US.  Consultants:  Gastroenterology  General  surgery  Procedures:  None  Antibiotics:  Unasyn  HPI/Subjective:  Comfortable in no acute distress   Objective: Filed Vitals:   05/15/14 0130 05/15/14 0456 05/15/14 0531 05/15/14 1018  BP: 133/81 113/58 126/84 153/108  Pulse: 58 56 58 72  Temp: 97.9 F (36.6 C)  97.9 F (36.6 C)   TempSrc: Oral  Oral   Resp: 16  15   Height:      Weight:      SpO2: 98%  99% 100%    Intake/Output Summary (Last 24 hours) at 05/15/14 1147 Last data filed at 05/15/14 0900  Gross per 24 hour  Intake   1134 ml  Output    700 ml  Net    434 ml    Exam:  General: alert & oriented x 3 In NAD  Cardiovascular: RRR, nl S1 s2  Respiratory: Decreased breath sounds at the bases, scattered rhonchi, no crackles  Abdomen: soft +BS NT/ND, no masses palpable  Extremities: No cyanosis and no edema      Data Reviewed: Basic Metabolic Panel:  Recent Labs Lab 05/13/14 0525 05/13/14 2357 05/14/14 0830 05/15/14 0420  NA 141 140 144 143  K 3.4* 3.7 3.7 3.3*  CL 102 100 103 104  CO2 28 28 28 29   GLUCOSE 124* 123* 87 90  BUN 18 16 14 13   CREATININE 1.49* 1.44* 1.43* 1.55*  CALCIUM 10.1 9.6 9.2 8.9    Liver Function Tests:  Recent Labs Lab 05/13/14 0525 05/13/14 2357 05/14/14 0830 05/15/14 0420  AST 59* 746* 568* 259*  ALT 33 691* 655* 451*  ALKPHOS 62 120* 132* 135*  BILITOT 1.0 4.6* 5.0* 4.4*  PROT 7.0 7.3 6.5 6.3  ALBUMIN 3.8 3.9 3.4* 3.2*    Recent Labs Lab 05/13/14 0525 05/13/14 2357  LIPASE 51 76*   No results found for this basename: AMMONIA,  in the last 168 hours  CBC:  Recent Labs Lab 05/13/14 0525 05/14/14 0010 05/14/14 0830 05/15/14 0420  WBC 6.1 5.8 5.3 4.1  NEUTROABS  --  4.6 3.4  --   HGB 14.1 14.6 13.6 13.3  HCT 40.4 41.6 39.2 38.9*  MCV 87.3 87.4 87.5 89.2  PLT 244 259 247 223    Cardiac Enzymes: No results found for this basename: CKTOTAL, CKMB, CKMBINDEX, TROPONINI,  in the last 168 hours BNP (last 3 results) No results found for this  basename: PROBNP,  in the last 8760 hours   CBG:  Recent Labs Lab 05/14/14 1222 05/14/14 1649 05/15/14 0010 05/15/14 0534  GLUCAP 79 67* 79 84    No results found for this or any previous visit (from the past 240 hour(s)).   Studies: Dg Chest 2 View  05/13/2014   CLINICAL DATA:  Pain.  EXAM: CHEST  2 VIEW  COMPARISON:  08/27/2013.  FINDINGS: The heart size and mediastinal contours are within normal limits. Both lungs are clear. The visualized skeletal structures are unremarkable.  IMPRESSION: No active cardiopulmonary disease.  Unchanged from prior exam.   Electronically Signed   By: Maisie Fus  Register   On: 05/13/2014 07:27   Ct Abdomen Pelvis W Contrast  05/13/2014   CLINICAL DATA:  28 year old with acute onset of left upper quadrant pain.  EXAM: CT ABDOMEN AND PELVIS WITH CONTRAST  TECHNIQUE: Multidetector CT imaging of the abdomen and pelvis was performed using the standard protocol following bolus administration of intravenous contrast.  CONTRAST:  OMNIPAQUE IOHEXOL 300 MG/ML  SOLN  COMPARISON:  None.  FINDINGS: The lung bases are clear.  No evidence for free air.  Normal appearance of the liver and portal venous system. The gallbladder is non distended but difficult to exclude mild gallbladder wall thickening. Normal appearance of the pancreas, spleen, adrenal glands and both kidneys. No significant free fluid or lymphadenopathy. Normal appearance of the prostate and urinary bladder. There is a retrocecal appendix without acute inflammation. There is mild dilatation of the small bowel loops in the mid abdomen. There is no significant bowel wall thickening.  No acute bone abnormality.  IMPRESSION: No acute abnormalities within the abdomen or pelvis.  Mild dilatation of small bowel loops without surrounding inflammatory changes or wall thickening. The small bowel findings are nonspecific.  Question mild gallbladder wall thickening without gallbladder distention. If this is the area of  clinical concern, consider further evaluation with ultrasound.   Electronically Signed   By: Richarda Overlie M.D.   On: 05/13/2014 09:54   US Abdomen Limited Ruq  05/14/2014   CLINICAL DATA:  Right upper quadrant pain.  EXAM: US ABDOMEN LIMITED - RIGHT UPPER QUADRANT  COMPARISON:  CT of the abdomen and pelvis May 13, 2014  FINDINGS: Gallbladder:  Multiple echogenic gallstones with acoustic shadowing measuring up to 11 mm. Gallbladder wall is 3 mm. No pericholecystic fluid. No sonographic Murphy's sign was elicited.  Common bile duct:  Diameter: 4 mm  Liver:  No focal lesion identified. Within normal limits in parenchymal echogenicity. Hepatopetal portal vein.  IMPRESSION: Cholelithiasis, top normal gallbladder wall thickness without corroborative findings of acute cholecystitis.   Electronically Signed   By: Awilda Metro   On: 05/14/2014 02:01  Scheduled Meds: . pantoprazole (PROTONIX) IV  40 mg Intravenous Q24H  . piperacillin-tazobactam (ZOSYN)  IV  3.375 g Intravenous 3 times per day   Continuous Infusions: . 0.9 % NaCl with KCl 40 mEq / L 75 mL/hr (05/15/14 1010)  . dextrose 5 % and 0.9% NaCl 75 mL/hr at 05/14/14 1715    Principal Problem:   Abdominal pain Active Problems:   Hypertension   Choledocholithiasis   Elevated LFTs    Time spent: 40 minutes   [facility]  [facility] Pager [phone]. If 7PM-7AM, please contact night-coverage at [email or web address], password [email or web address] 05/15/2014, 11:47 AM  LOS: 1 day

## 2014-05-15 NOTE — Progress Notes (Signed)
Patient ID: Paul Reeves, male   DOB: 11-10-1985, 28 y.o.   MRN: [id number]     [place]      [address] [place], [place] [address]    Phone: [phone] FAX: [phone]     Subjective: No abdominal pain.  C/o dark urine, but no dysuria or hematuria.  WBC normal.    Bilirubin 5--->4.4 today.    Objective:  Vital signs:  Filed Vitals:   05/15/14 0049 05/15/14 0130 05/15/14 0456 05/15/14 0531  BP: 124/71 133/81 113/58 126/84  Pulse: 65 58 56 58  Temp:  97.9 F (36.6 C)  97.9 F (36.6 C)  TempSrc:  Oral  Oral  Resp:  16  15  Height:      Weight:      SpO2:  98%  99%    Last BM Date: 05/13/14  Intake/Output   Yesterday:  08/19 0701 - 08/20 0700 In: 450 [I.V.:400; IV Piggyback:50] Out: 300 [Urine:300] This shift:  Total I/O In: 734 [I.V.:734] Out: -   Physical Exam:  General: Pt awake/alert/oriented x4 in no acute distress  Abdomen: Soft. Nondistended. Non tender. No evidence of peritonitis. No incarcerated hernias.   Problem List:   Principal Problem:   Abdominal pain Active Problems:   Hypertension   Choledocholithiasis   Elevated LFTs    Results:   Labs: Results for orders placed during the hospital encounter of 05/14/14 (from the past 48 hour(s))  COMPREHENSIVE METABOLIC PANEL     Status: Abnormal   Collection Time    05/13/14 11:57 PM      Result Value Ref Range   Sodium 140  137 - 147 mEq/L   Potassium 3.7  3.7 - 5.3 mEq/L   Chloride 100  96 - 112 mEq/L   CO2 28  19 - 32 mEq/L   Glucose, Bld 123 (*) 70 - 99 mg/dL   BUN 16  6 - 23 mg/dL   Creatinine, Ser 1.44 (*) 0.50 - 1.35 mg/dL   Calcium 9.6  8.4 - 10.5 mg/dL   Total Protein 7.3  6.0 - 8.3 g/dL   Albumin 3.9  3.5 - 5.2 g/dL   AST 746 (*) 0 - 37 U/L   ALT 691 (*) 0 - 53 U/L   Alkaline Phosphatase 120 (*) 39 - 117 U/L   Total Bilirubin 4.6 (*) 0.3 - 1.2 mg/dL   GFR calc non Af Amer 65 (*) >90 mL/min   GFR calc Af Amer 75 (*)  >90 mL/min   Comment: (NOTE)     The eGFR has been calculated using the CKD EPI equation.     This calculation has not been validated in all clinical situations.     eGFR's persistently <90 mL/min signify possible Chronic Kidney     Disease.   Anion gap 12  5 - 15  LIPASE, BLOOD     Status: Abnormal   Collection Time    05/13/14 11:57 PM      Result Value Ref Range   Lipase 76 (*) 11 - 59 U/L  CBC WITH DIFFERENTIAL     Status: Abnormal   Collection Time    05/14/14 12:10 AM      Result Value Ref Range   WBC 5.8  4.0 - 10.5 K/uL   RBC 4.76  4.22 - 5.81 MIL/uL   Hemoglobin 14.6  13.0 - 17.0 g/dL   HCT 41.6  39.0 - 52.0 %   MCV  87.4  78.0 - 100.0 fL   MCH 30.7  26.0 - 34.0 pg   MCHC 35.1  30.0 - 36.0 g/dL   RDW 12.1  11.5 - 15.5 %   Platelets 259  150 - 400 K/uL   Neutrophils Relative % 80 (*) 43 - 77 %   Neutro Abs 4.6  1.7 - 7.7 K/uL   Lymphocytes Relative 13  12 - 46 %   Lymphs Abs 0.7  0.7 - 4.0 K/uL   Monocytes Relative 6  3 - 12 %   Monocytes Absolute 0.3  0.1 - 1.0 K/uL   Eosinophils Relative 1  0 - 5 %   Eosinophils Absolute 0.0  0.0 - 0.7 K/uL   Basophils Relative 0  0 - 1 %   Basophils Absolute 0.0  0.0 - 0.1 K/uL  URINALYSIS, ROUTINE W REFLEX MICROSCOPIC     Status: Abnormal   Collection Time    05/14/14  7:26 AM      Result Value Ref Range   Color, Urine AMBER (*) YELLOW   Comment: BIOCHEMICALS MAY BE AFFECTED BY COLOR   APPearance CLEAR  CLEAR   Specific Gravity, Urine 1.031 (*) 1.005 - 1.030   pH 7.0  5.0 - 8.0   Glucose, UA NEGATIVE  NEGATIVE mg/dL   Hgb urine dipstick NEGATIVE  NEGATIVE   Bilirubin Urine LARGE (*) NEGATIVE   Ketones, ur 15 (*) NEGATIVE mg/dL   Protein, ur 30 (*) NEGATIVE mg/dL   Urobilinogen, UA 4.0 (*) 0.0 - 1.0 mg/dL   Nitrite NEGATIVE  NEGATIVE   Leukocytes, UA TRACE (*) NEGATIVE  URINE MICROSCOPIC-ADD ON     Status: Abnormal   Collection Time    05/14/14  7:26 AM      Result Value Ref Range   Squamous Epithelial / LPF RARE   RARE   WBC, UA 0-2  <3 WBC/hpf   Bacteria, UA FEW (*) RARE  HEPATIC FUNCTION PANEL     Status: Abnormal   Collection Time    05/14/14  8:30 AM      Result Value Ref Range   Total Protein 6.5  6.0 - 8.3 g/dL   Albumin 3.4 (*) 3.5 - 5.2 g/dL   AST 568 (*) 0 - 37 U/L   ALT 655 (*) 0 - 53 U/L   Alkaline Phosphatase 132 (*) 39 - 117 U/L   Total Bilirubin 5.0 (*) 0.3 - 1.2 mg/dL   Bilirubin, Direct 3.5 (*) 0.0 - 0.3 mg/dL   Indirect Bilirubin 1.5 (*) 0.3 - 0.9 mg/dL  PROTIME-INR     Status: None   Collection Time    05/14/14  8:30 AM      Result Value Ref Range   Prothrombin Time 14.1  11.6 - 15.2 seconds   INR 1.09  0.00 - 1.49  CBC WITH DIFFERENTIAL     Status: None   Collection Time    05/14/14  8:30 AM      Result Value Ref Range   WBC 5.3  4.0 - 10.5 K/uL   RBC 4.48  4.22 - 5.81 MIL/uL   Hemoglobin 13.6  13.0 - 17.0 g/dL   HCT 39.2  39.0 - 52.0 %   MCV 87.5  78.0 - 100.0 fL   MCH 30.4  26.0 - 34.0 pg   MCHC 34.7  30.0 - 36.0 g/dL   RDW 12.3  11.5 - 15.5 %   Platelets 247  150 - 400 K/uL   Neutrophils Relative %  65  43 - 77 %   Neutro Abs 3.4  1.7 - 7.7 K/uL   Lymphocytes Relative 26  12 - 46 %   Lymphs Abs 1.4  0.7 - 4.0 K/uL   Monocytes Relative 8  3 - 12 %   Monocytes Absolute 0.4  0.1 - 1.0 K/uL   Eosinophils Relative 1  0 - 5 %   Eosinophils Absolute 0.0  0.0 - 0.7 K/uL   Basophils Relative 0  0 - 1 %   Basophils Absolute 0.0  0.0 - 0.1 K/uL  BASIC METABOLIC PANEL     Status: Abnormal   Collection Time    05/14/14  8:30 AM      Result Value Ref Range   Sodium 144  137 - 147 mEq/L   Potassium 3.7  3.7 - 5.3 mEq/L   Chloride 103  96 - 112 mEq/L   CO2 28  19 - 32 mEq/L   Glucose, Bld 87  70 - 99 mg/dL   BUN 14  6 - 23 mg/dL   Creatinine, Ser 4.62 (*) 0.50 - 1.35 mg/dL   Calcium 9.2  8.4 - 53.1 mg/dL   GFR calc non Af Amer 65 (*) >90 mL/min   GFR calc Af Amer 75 (*) >90 mL/min   Comment: (NOTE)     The eGFR has been calculated using the CKD EPI equation.      This calculation has not been validated in all clinical situations.     eGFR's persistently <90 mL/min signify possible Chronic Kidney     Disease.   Anion gap 13  5 - 15  GLUCOSE, CAPILLARY     Status: None   Collection Time    05/14/14 12:22 PM      Result Value Ref Range   Glucose-Capillary 79  70 - 99 mg/dL  GLUCOSE, CAPILLARY     Status: Abnormal   Collection Time    05/14/14  4:49 PM      Result Value Ref Range   Glucose-Capillary 67 (*) 70 - 99 mg/dL  GLUCOSE, CAPILLARY     Status: None   Collection Time    05/15/14 12:10 AM      Result Value Ref Range   Glucose-Capillary 79  70 - 99 mg/dL   Comment 1 Notify RN    CBC     Status: Abnormal   Collection Time    05/15/14  4:20 AM      Result Value Ref Range   WBC 4.1  4.0 - 10.5 K/uL   RBC 4.36  4.22 - 5.81 MIL/uL   Hemoglobin 13.3  13.0 - 17.0 g/dL   HCT 03.3 (*) 75.2 - 61.6 %   MCV 89.2  78.0 - 100.0 fL   MCH 30.5  26.0 - 34.0 pg   MCHC 34.2  30.0 - 36.0 g/dL   RDW 11.9  63.8 - 12.0 %   Platelets 223  150 - 400 K/uL  COMPREHENSIVE METABOLIC PANEL     Status: Abnormal   Collection Time    05/15/14  4:20 AM      Result Value Ref Range   Sodium 143  137 - 147 mEq/L   Potassium 3.3 (*) 3.7 - 5.3 mEq/L   Chloride 104  96 - 112 mEq/L   CO2 29  19 - 32 mEq/L   Glucose, Bld 90  70 - 99 mg/dL   BUN 13  6 - 23 mg/dL   Creatinine, Ser 3.22 (*)  0.50 - 1.35 mg/dL   Calcium 8.9  8.4 - 10.5 mg/dL   Total Protein 6.3  6.0 - 8.3 g/dL   Albumin 3.2 (*) 3.5 - 5.2 g/dL   AST 259 (*) 0 - 37 U/L   ALT 451 (*) 0 - 53 U/L   Alkaline Phosphatase 135 (*) 39 - 117 U/L   Total Bilirubin 4.4 (*) 0.3 - 1.2 mg/dL   GFR calc non Af Amer 59 (*) >90 mL/min   GFR calc Af Amer 68 (*) >90 mL/min   Comment: (NOTE)     The eGFR has been calculated using the CKD EPI equation.     This calculation has not been validated in all clinical situations.     eGFR's persistently <90 mL/min signify possible Chronic Kidney     Disease.   Anion gap 10  5  - 15  GLUCOSE, CAPILLARY     Status: None   Collection Time    05/15/14  5:34 AM      Result Value Ref Range   Glucose-Capillary 84  70 - 99 mg/dL   Comment 1 Notify RN      Imaging / Studies: Ct Abdomen Pelvis W Contrast  05/13/2014   CLINICAL DATA:  28 year old with acute onset of left upper quadrant pain.  EXAM: CT ABDOMEN AND PELVIS WITH CONTRAST  TECHNIQUE: Multidetector CT imaging of the abdomen and pelvis was performed using the standard protocol following bolus administration of intravenous contrast.  CONTRAST:  168mL OMNIPAQUE IOHEXOL 300 MG/ML  SOLN  COMPARISON:  None.  FINDINGS: The lung bases are clear.  No evidence for free air.  Normal appearance of the liver and portal venous system. The gallbladder is non distended but difficult to exclude mild gallbladder wall thickening. Normal appearance of the pancreas, spleen, adrenal glands and both kidneys. No significant free fluid or lymphadenopathy. Normal appearance of the prostate and urinary bladder. There is a retrocecal appendix without acute inflammation. There is mild dilatation of the small bowel loops in the mid abdomen. There is no significant bowel wall thickening.  No acute bone abnormality.  IMPRESSION: No acute abnormalities within the abdomen or pelvis.  Mild dilatation of small bowel loops without surrounding inflammatory changes or wall thickening. The small bowel findings are nonspecific.  Question mild gallbladder wall thickening without gallbladder distention. If this is the area of clinical concern, consider further evaluation with ultrasound.   Electronically Signed   By: Markus Daft M.D.   On: 05/13/2014 09:54   US Abdomen Limited Ruq  05/14/2014   CLINICAL DATA:  Right upper quadrant pain.  EXAM: US ABDOMEN LIMITED - RIGHT UPPER QUADRANT  COMPARISON:  CT of the abdomen and pelvis May 13, 2014  FINDINGS: Gallbladder:  Multiple echogenic gallstones with acoustic shadowing measuring up to 11 mm. Gallbladder wall is 3 mm.  No pericholecystic fluid. No sonographic Murphy's sign was elicited.  Common bile duct:  Diameter: 4 mm  Liver:  No focal lesion identified. Within normal limits in parenchymal echogenicity. Hepatopetal portal vein.  IMPRESSION: Cholelithiasis, top normal gallbladder wall thickness without corroborative findings of acute cholecystitis.   Electronically Signed   By: Elon Alas   On: 05/14/2014 02:01    Medications / Allergies:  Scheduled Meds: . pantoprazole (PROTONIX) IV  40 mg Intravenous Q24H  . piperacillin-tazobactam (ZOSYN)  IV  3.375 g Intravenous 3 times per day   Continuous Infusions: . 0.9 % NaCl with KCl 40 mEq / L    .  dextrose 5 % and 0.9% NaCl 75 mL/hr at 05/14/14 1715   PRN Meds:.acetaminophen, acetaminophen, hydrALAZINE, labetalol, ondansetron (ZOFRAN) IV, ondansetron  Antibiotics: Anti-infectives   Start     Dose/Rate Route Frequency Ordered Stop   05/14/14 1000  piperacillin-tazobactam (ZOSYN) IVPB 3.375 g     3.375 g 12.5 mL/hr over 240 Minutes Intravenous 3 times per day 05/14/14 0605     05/14/14 0315  piperacillin-tazobactam (ZOSYN) IVPB 3.375 g     3.375 g 12.5 mL/hr over 240 Minutes Intravenous  Once 05/14/14 0305 05/14/14 0734        Assessment/Plan Symptomatic cholelithiasis  Elevated liver enzymes  -await MRCP results.  Will proceed with a laparoscopic cholecystectomy with IOC after ERCP or if MRCP is negative.  -zosyn  -IV hydration  -SCD, consider chemical VTE prophylaxis  -repeat labs in AM -encourage ambulation  -pt asking about his home BP med regimen, will leave up to primary team to reconcile.  Erby Pian, [facility] Pager [phone]) For consults and floor pages call [phone]([id number])  05/15/2014 8:15 AM

## 2014-05-15 NOTE — Progress Notes (Signed)
Currently pain free Bili still elevated MRCP report pending.  If obstruction of CBD, ERCP tomorrow If no obstruction and Bili trending down, lap chole tomorrow. Clear liquids today NPO p MN  Wilmon ArmsMatthew K. Corliss Skainssuei, MD, [facility]  General/ Trauma Surgery  05/15/2014 9:26 AM

## 2014-05-15 NOTE — Progress Notes (Addendum)
Subjective: No acute events feels well.  Objective: Vital signs in last 24 hours: Temp:  [97.9 F (36.6 C)-98.7 F (37.1 C)] 97.9 F (36.6 C) (08/20 0531) Pulse Rate:  [56-71] 58 (08/20 0531) Resp:  [15-16] 15 (08/20 0531) BP: (113-142)/(58-85) 126/84 mmHg (08/20 0531) SpO2:  [97 %-100 %] 99 % (08/20 0531) Last BM Date: 05/13/14  Intake/Output from previous day: 08/19 0701 - 08/20 0700 In: 450 [I.V.:400; IV Piggyback:50] Out: 300 [Urine:300] Intake/Output this shift: Total I/O In: -  Out: 300 [Urine:300]  General appearance: arousable from sleep. GI: soft, non-tender; bowel sounds normal; no masses,  no organomegaly  Lab Results:  Recent Labs  05/14/14 0010 05/14/14 0830 05/15/14 0420  WBC 5.8 5.3 4.1  HGB 14.6 13.6 13.3  HCT 41.6 39.2 38.9*  PLT 259 247 223   BMET  Recent Labs  05/13/14 2357 05/14/14 0830 05/15/14 0420  NA 140 144 143  K 3.7 3.7 3.3*  CL 100 103 104  CO2 28 28 29   GLUCOSE 123* 87 90  BUN 16 14 13   CREATININE 1.44* 1.43* 1.55*  CALCIUM 9.6 9.2 8.9   LFT  Recent Labs  05/14/14 0830 05/15/14 0420  PROT 6.5 6.3  ALBUMIN 3.4* 3.2*  AST 568* 259*  ALT 655* 451*  ALKPHOS 132* 135*  BILITOT 5.0* 4.4*  BILIDIR 3.5*  --   IBILI 1.5*  --    PT/INR  Recent Labs  05/14/14 0830  LABPROT 14.1  INR 1.09   Hepatitis Panel No results found for this basename: HEPBSAG, HCVAB, HEPAIGM, HEPBIGM,  in the last 72 hours C-Diff No results found for this basename: CDIFFTOX,  in the last 72 hours Fecal Lactopherrin No results found for this basename: FECLLACTOFRN,  in the last 72 hours  Studies/Results: Dg Chest 2 View  05/13/2014   CLINICAL DATA:  Pain.  EXAM: CHEST  2 VIEW  COMPARISON:  08/27/2013.  FINDINGS: The heart size and mediastinal contours are within normal limits. Both lungs are clear. The visualized skeletal structures are unremarkable.  IMPRESSION: No active cardiopulmonary disease.  Unchanged from prior exam.   Electronically  Signed   By: Maisie Fus  Register   On: 05/13/2014 07:27   Ct Abdomen Pelvis W Contrast  05/13/2014   CLINICAL DATA:  28 year old with acute onset of left upper quadrant pain.  EXAM: CT ABDOMEN AND PELVIS WITH CONTRAST  TECHNIQUE: Multidetector CT imaging of the abdomen and pelvis was performed using the standard protocol following bolus administration of intravenous contrast.  CONTRAST:  OMNIPAQUE IOHEXOL 300 MG/ML  SOLN  COMPARISON:  None.  FINDINGS: The lung bases are clear.  No evidence for free air.  Normal appearance of the liver and portal venous system. The gallbladder is non distended but difficult to exclude mild gallbladder wall thickening. Normal appearance of the pancreas, spleen, adrenal glands and both kidneys. No significant free fluid or lymphadenopathy. Normal appearance of the prostate and urinary bladder. There is a retrocecal appendix without acute inflammation. There is mild dilatation of the small bowel loops in the mid abdomen. There is no significant bowel wall thickening.  No acute bone abnormality.  IMPRESSION: No acute abnormalities within the abdomen or pelvis.  Mild dilatation of small bowel loops without surrounding inflammatory changes or wall thickening. The small bowel findings are nonspecific.  Question mild gallbladder wall thickening without gallbladder distention. If this is the area of clinical concern, consider further evaluation with ultrasound.   Electronically Signed   By: Richarda Overlie  M.D.   On: 05/13/2014 09:54   [place] Abdomen Limited Ruq  05/14/2014   CLINICAL DATA:  Right upper quadrant pain.  EXAM: US ABDOMEN LIMITED - RIGHT UPPER QUADRANT  COMPARISON:  CT of the abdomen and pelvis May 13, 2014  FINDINGS: Gallbladder:  Multiple echogenic gallstones with acoustic shadowing measuring up to 11 mm. Gallbladder wall is 3 mm. No pericholecystic fluid. No sonographic Murphy's sign was elicited.  Common bile duct:  Diameter: 4 mm  Liver:  No focal lesion identified.  Within normal limits in parenchymal echogenicity. Hepatopetal portal vein.  IMPRESSION: Cholelithiasis, top normal gallbladder wall thickness without corroborative findings of acute cholecystitis.   Electronically Signed   By: Awilda Metroourtnay  Bloomer   On: 05/14/2014 02:01    Medications:  Scheduled: . labetalol  10 mg Intravenous Q2H  . pantoprazole (PROTONIX) IV  40 mg Intravenous Q24H  . piperacillin-tazobactam (ZOSYN)  IV  3.375 g Intravenous 3 times per day   Continuous: . dextrose 5 % and 0.9% NaCl 75 mL/hr at 05/14/14 1715    Assessment/Plan: 1) ? Choledocholithiasis. 2) Elevated liver enzymes.   The official reading from the MRCP is pending, but there may be a stone in the distal CBD.  If that is the case I will have the patient scheduled for an ERCP tomorrow.  He will also require a preprocedure dose of rectal indomethacin to prophylax against post ERCP pancreatitis.  Plan: 1) Await MRCP results.   ADDENDUM:  I spoke with Radiology.  No evidence of choledocholithiasis.  LOS: 1 day   Kelby Adell D 05/15/2014, 6:55 AM

## 2014-05-15 NOTE — Progress Notes (Signed)
Patient ID: Paul Reeves, male   DOB: 02/15/1986, 28 y.o.   MRN: [id number]  Reviewed MRCP with Dr. Amil AmenEdmunds in radiology, report is also in canopy but not transferring to epic  1. Cholelithiasis with nonspecific chronic mild gallbladder wall thickening. 2. No evidence of biliary dilatation or choledocholithiasis. 3. The pancreas appears normal   We will proceed with a laparoscopic cholecystectomy with IOC tomorrow morning.  I have discussed surgical risks including but not limited to infection, bleeding, injury to surrounding structures.  The patient verbalizes understanding and wishes to proceed.  He is on zosyn, not on chemical anticoagulation, may have clears, NPO after midnight.   Josslynn Mentzer, ANP-BC

## 2014-05-16 ENCOUNTER — Encounter (HOSPITAL_COMMUNITY): Payer: MEDICAID | Admitting: Anesthesiology

## 2014-05-16 ENCOUNTER — Inpatient Hospital Stay (HOSPITAL_COMMUNITY): Payer: Self-pay

## 2014-05-16 ENCOUNTER — Encounter (HOSPITAL_COMMUNITY)
Admission: EM | Disposition: A | Discharge status: Home / Self Care | Payer: Self-pay | Source: Home / Self Care | Attending: Internal Medicine

## 2014-05-16 ENCOUNTER — Inpatient Hospital Stay (HOSPITAL_COMMUNITY): Payer: Self-pay | Admitting: Anesthesiology

## 2014-05-16 DIAGNOSIS — K8010 Calculus of gallbladder with chronic cholecystitis without obstruction: Secondary | ICD-10-CM

## 2014-05-16 HISTORY — PX: CHOLECYSTECTOMY: SHX55

## 2014-05-16 LAB — GLUCOSE, CAPILLARY
GLUCOSE-CAPILLARY: 100 mg/dL — AB (ref 70–99)
GLUCOSE-CAPILLARY: 145 mg/dL — AB (ref 70–99)
Glucose-Capillary: 134 mg/dL — ABNORMAL HIGH (ref 70–99)
Glucose-Capillary: 86 mg/dL (ref 70–99)
Glucose-Capillary: 96 mg/dL (ref 70–99)
Glucose-Capillary: 98 mg/dL (ref 70–99)

## 2014-05-16 LAB — COMPREHENSIVE METABOLIC PANEL
ALBUMIN: 3.2 g/dL — AB (ref 3.5–5.2)
ALK PHOS: 127 U/L — AB (ref 39–117)
ALT: 341 U/L — ABNORMAL HIGH (ref 0–53)
ANION GAP: 12 (ref 5–15)
AST: 141 U/L — ABNORMAL HIGH (ref 0–37)
BUN: 8 mg/dL (ref 6–23)
CO2: 26 mEq/L (ref 19–32)
Calcium: 9 mg/dL (ref 8.4–10.5)
Chloride: 106 mEq/L (ref 96–112)
Creatinine, Ser: 1.52 mg/dL — ABNORMAL HIGH (ref 0.50–1.35)
GFR calc Af Amer: 70 mL/min — ABNORMAL LOW (ref >90)
GFR calc non Af Amer: 60 mL/min — ABNORMAL LOW (ref >90)
Glucose, Bld: 79 mg/dL (ref 70–99)
POTASSIUM: 3.8 meq/L (ref 3.7–5.3)
SODIUM: 144 meq/L (ref 137–147)
Total Bilirubin: 2.7 mg/dL — ABNORMAL HIGH (ref 0.3–1.2)
Total Protein: 6.3 g/dL (ref 6.0–8.3)

## 2014-05-16 LAB — SURGICAL PCR SCREEN
MRSA, PCR: NEGATIVE
STAPHYLOCOCCUS AUREUS: NEGATIVE

## 2014-05-16 LAB — CBC
HCT: 40.7 % (ref 39.0–52.0)
HEMOGLOBIN: 14.2 g/dL (ref 13.0–17.0)
MCH: 31 pg (ref 26.0–34.0)
MCHC: 34.9 g/dL (ref 30.0–36.0)
MCV: 88.9 fL (ref 78.0–100.0)
Platelets: 236 10*3/uL (ref 150–400)
RBC: 4.58 MIL/uL (ref 4.22–5.81)
RDW: 12.2 % (ref 11.5–15.5)
WBC: 8.9 10*3/uL (ref 4.0–10.5)

## 2014-05-16 LAB — CREATININE, SERUM
Creatinine, Ser: 1.33 mg/dL (ref 0.50–1.35)
GFR, EST AFRICAN AMERICAN: 82 mL/min — AB (ref >90)
GFR, EST NON AFRICAN AMERICAN: 71 mL/min — AB (ref >90)

## 2014-05-16 SURGERY — LAPAROSCOPIC CHOLECYSTECTOMY WITH INTRAOPERATIVE CHOLANGIOGRAM
Anesthesia: General | Site: Abdomen

## 2014-05-16 MED ORDER — OXYCODONE-ACETAMINOPHEN 5-325 MG PO TABS: 1.0000 | ORAL_TABLET | ORAL | Status: DC | PRN

## 2014-05-16 MED ORDER — ROCURONIUM BROMIDE 100 MG/10ML IV SOLN
INTRAVENOUS | Status: DC | PRN
  Administered 2014-05-16: 40 mg via INTRAVENOUS

## 2014-05-16 MED ORDER — ONDANSETRON HCL 4 MG PO TABS: 4.0000 mg | ORAL_TABLET | Freq: Four times a day (QID) | ORAL | Status: DC | PRN

## 2014-05-16 MED ORDER — LACTATED RINGERS IV SOLN
INTRAVENOUS | Status: DC | PRN
  Administered 2014-05-16: 09:00:00 via INTRAVENOUS

## 2014-05-16 MED ORDER — NEOSTIGMINE METHYLSULFATE 10 MG/10ML IV SOLN
INTRAVENOUS | Status: AC
  Filled 2014-05-16: qty 1

## 2014-05-16 MED ORDER — ONDANSETRON HCL 4 MG/2ML IJ SOLN
INTRAMUSCULAR | Status: AC
Start: 2014-05-16 — End: 2014-05-16
  Filled 2014-05-16: qty 2

## 2014-05-16 MED ORDER — BUPIVACAINE-EPINEPHRINE 0.25% -1:200000 IJ SOLN
INTRAMUSCULAR | Status: DC | PRN
  Administered 2014-05-16: 30 mL

## 2014-05-16 MED ORDER — SODIUM CHLORIDE 0.9 % IR SOLN
Status: DC | PRN
  Administered 2014-05-16: 1000 mL

## 2014-05-16 MED ORDER — PROPOFOL 10 MG/ML IV BOLUS
INTRAVENOUS | Status: DC | PRN
  Administered 2014-05-16: 180 mg via INTRAVENOUS

## 2014-05-16 MED ORDER — PROMETHAZINE HCL 25 MG/ML IJ SOLN
6.2500 mg | INTRAMUSCULAR | Status: DC | PRN
  Administered 2014-05-16: 6.25 mg via INTRAVENOUS

## 2014-05-16 MED ORDER — ROCURONIUM BROMIDE 50 MG/5ML IV SOLN
INTRAVENOUS | Status: AC
  Filled 2014-05-16: qty 1

## 2014-05-16 MED ORDER — PROPOFOL 10 MG/ML IV BOLUS
INTRAVENOUS | Status: AC
  Filled 2014-05-16: qty 20

## 2014-05-16 MED ORDER — LIDOCAINE HCL (CARDIAC) 20 MG/ML IV SOLN
INTRAVENOUS | Status: DC | PRN
  Administered 2014-05-16: 100 mg via INTRAVENOUS

## 2014-05-16 MED ORDER — MIDAZOLAM HCL 5 MG/5ML IJ SOLN
INTRAMUSCULAR | Status: DC | PRN
  Administered 2014-05-16: 2 mg via INTRAVENOUS

## 2014-05-16 MED ORDER — FENTANYL CITRATE 0.05 MG/ML IJ SOLN
INTRAMUSCULAR | Status: DC | PRN
  Administered 2014-05-16: 150 ug via INTRAVENOUS
  Administered 2014-05-16: 50 ug via INTRAVENOUS

## 2014-05-16 MED ORDER — GLYCOPYRROLATE 0.2 MG/ML IJ SOLN
INTRAMUSCULAR | Status: DC | PRN
  Administered 2014-05-16: .4 mg via INTRAVENOUS

## 2014-05-16 MED ORDER — MIDAZOLAM HCL 2 MG/2ML IJ SOLN
INTRAMUSCULAR | Status: AC
  Filled 2014-05-16: qty 2

## 2014-05-16 MED ORDER — ONDANSETRON HCL 4 MG/2ML IJ SOLN
4.0000 mg | Freq: Four times a day (QID) | INTRAMUSCULAR | Status: DC | PRN
  Administered 2014-05-16 (×2): 4 mg via INTRAVENOUS
  Filled 2014-05-16 (×2): qty 2

## 2014-05-16 MED ORDER — NEOSTIGMINE METHYLSULFATE 10 MG/10ML IV SOLN
INTRAVENOUS | Status: DC | PRN
  Administered 2014-05-16: 3 mg via INTRAVENOUS

## 2014-05-16 MED ORDER — FENTANYL CITRATE 0.05 MG/ML IJ SOLN
INTRAMUSCULAR | Status: AC
  Filled 2014-05-16: qty 5

## 2014-05-16 MED ORDER — IOHEXOL 300 MG/ML  SOLN
INTRAMUSCULAR | Status: DC | PRN
  Administered 2014-05-16: 10:00:00

## 2014-05-16 MED ORDER — LIDOCAINE HCL (CARDIAC) 20 MG/ML IV SOLN
INTRAVENOUS | Status: AC
  Filled 2014-05-16: qty 5

## 2014-05-16 MED ORDER — MORPHINE SULFATE 2 MG/ML IJ SOLN
2.0000 mg | INTRAMUSCULAR | Status: DC | PRN
Start: 2014-05-16 — End: 2014-05-17
  Administered 2014-05-16: 2 mg via INTRAVENOUS
  Administered 2014-05-16: 4 mg via INTRAVENOUS
  Administered 2014-05-16: 2 mg via INTRAVENOUS
  Administered 2014-05-17 (×2): 4 mg via INTRAVENOUS
  Filled 2014-05-16 (×4): qty 2
  Filled 2014-05-16 (×2): qty 1

## 2014-05-16 MED ORDER — ONDANSETRON HCL 4 MG/2ML IJ SOLN
INTRAMUSCULAR | Status: DC | PRN
  Administered 2014-05-16: 4 mg via INTRAVENOUS

## 2014-05-16 MED ORDER — MEPERIDINE HCL 25 MG/ML IJ SOLN: 6.2500 mg | INTRAMUSCULAR | Status: DC | PRN

## 2014-05-16 MED ORDER — DIPHENHYDRAMINE HCL 50 MG/ML IJ SOLN
INTRAMUSCULAR | Status: DC | PRN
  Administered 2014-05-16: 12.5 mg via INTRAVENOUS

## 2014-05-16 MED ORDER — GLYCOPYRROLATE 0.2 MG/ML IJ SOLN
INTRAMUSCULAR | Status: AC
  Filled 2014-05-16: qty 2

## 2014-05-16 MED ORDER — 0.9 % SODIUM CHLORIDE (POUR BTL) OPTIME
TOPICAL | Status: DC | PRN
  Administered 2014-05-16: 1000 mL

## 2014-05-16 MED ORDER — PROMETHAZINE HCL 25 MG/ML IJ SOLN
INTRAMUSCULAR | Status: AC
  Filled 2014-05-16: qty 1

## 2014-05-16 MED ORDER — SUCCINYLCHOLINE CHLORIDE 20 MG/ML IJ SOLN
INTRAMUSCULAR | Status: AC
  Filled 2014-05-16: qty 1

## 2014-05-16 MED ORDER — FENTANYL CITRATE 0.05 MG/ML IJ SOLN: 25.0000 ug | INTRAMUSCULAR | Status: DC | PRN

## 2014-05-16 MED ORDER — BUPIVACAINE-EPINEPHRINE (PF) 0.25% -1:200000 IJ SOLN
INTRAMUSCULAR | Status: AC
  Filled 2014-05-16: qty 30

## 2014-05-16 MED ORDER — ENOXAPARIN SODIUM 40 MG/0.4ML ~~LOC~~ SOLN
40.0000 mg | SUBCUTANEOUS | Status: DC
  Administered 2014-05-17: 40 mg via SUBCUTANEOUS
  Filled 2014-05-16 (×2): qty 0.4

## 2014-05-16 SURGICAL SUPPLY — 46 items
APL SKNCLS STERI-STRIP NONHPOA (GAUZE/BANDAGES/DRESSINGS) ×1
APPLIER CLIP ROT 10 11.4 M/L (STAPLE) ×3
APR CLP MED LRG 11.4X10 (STAPLE) ×1
BAG SPEC RTRVL LRG 6X4 10 (ENDOMECHANICALS) ×1
BENZOIN TINCTURE PRP APPL 2/3 (GAUZE/BANDAGES/DRESSINGS) ×3 IMPLANT
BLADE SURG ROTATE 9660 (MISCELLANEOUS) ×2 IMPLANT
CANISTER SUCTION 2500CC (MISCELLANEOUS) ×3 IMPLANT
CHLORAPREP W/TINT 26ML (MISCELLANEOUS) ×3 IMPLANT
CLIP APPLIE ROT 10 11.4 M/L (STAPLE) ×1 IMPLANT
CLOSURE WOUND 1/4 X3 (GAUZE/BANDAGES/DRESSINGS) ×1
COVER MAYO STAND STRL (DRAPES) ×3 IMPLANT
COVER SURGICAL LIGHT HANDLE (MISCELLANEOUS) ×3 IMPLANT
DRAPE C-ARM 42X72 X-RAY (DRAPES) ×3 IMPLANT
DRAPE UTILITY 15X26 W/TAPE STR (DRAPE) ×6 IMPLANT
DRSG TEGADERM 2-3/8X2-3/4 SM (GAUZE/BANDAGES/DRESSINGS) ×9 IMPLANT
DRSG TEGADERM 4X4.75 (GAUZE/BANDAGES/DRESSINGS) ×3 IMPLANT
ELECT REM PT RETURN 9FT ADLT (ELECTROSURGICAL) ×3
ELECTRODE REM PT RTRN 9FT ADLT (ELECTROSURGICAL) ×1 IMPLANT
FILTER SMOKE EVAC LAPAROSHD (FILTER) ×3 IMPLANT
GAUZE SPONGE 2X2 8PLY STRL LF (GAUZE/BANDAGES/DRESSINGS) ×1 IMPLANT
GLOVE BIO SURGEON STRL SZ7 (GLOVE) ×7 IMPLANT
GLOVE BIOGEL PI IND STRL 7.0 (GLOVE) IMPLANT
GLOVE BIOGEL PI IND STRL 7.5 (GLOVE) ×1 IMPLANT
GLOVE BIOGEL PI INDICATOR 7.0 (GLOVE) ×2
GLOVE BIOGEL PI INDICATOR 7.5 (GLOVE) ×2
GOWN STRL REUS W/ TWL LRG LVL3 (GOWN DISPOSABLE) ×4 IMPLANT
GOWN STRL REUS W/TWL LRG LVL3 (GOWN DISPOSABLE) ×9
KIT BASIN OR (CUSTOM PROCEDURE TRAY) ×3 IMPLANT
KIT ROOM TURNOVER OR (KITS) ×3 IMPLANT
NS IRRIG 1000ML POUR BTL (IV SOLUTION) ×3 IMPLANT
PAD ARMBOARD 7.5X6 YLW CONV (MISCELLANEOUS) ×3 IMPLANT
POUCH SPECIMEN RETRIEVAL 10MM (ENDOMECHANICALS) ×3 IMPLANT
SCISSORS LAP 5X35 DISP (ENDOMECHANICALS) ×3 IMPLANT
SET CHOLANGIOGRAPH 5 50 .035 (SET/KITS/TRAYS/PACK) ×3 IMPLANT
SET IRRIG TUBING LAPAROSCOPIC (IRRIGATION / IRRIGATOR) ×3 IMPLANT
SLEEVE ENDOPATH XCEL 5M (ENDOMECHANICALS) ×3 IMPLANT
SPECIMEN JAR SMALL (MISCELLANEOUS) ×3 IMPLANT
SPONGE GAUZE 2X2 STER 10/PKG (GAUZE/BANDAGES/DRESSINGS) ×2
STRIP CLOSURE SKIN 1/4X3 (GAUZE/BANDAGES/DRESSINGS) ×1 IMPLANT
SUT MNCRL AB 4-0 PS2 18 (SUTURE) ×3 IMPLANT
TOWEL OR 17X24 6PK STRL BLUE (TOWEL DISPOSABLE) ×3 IMPLANT
TOWEL OR 17X26 10 PK STRL BLUE (TOWEL DISPOSABLE) ×3 IMPLANT
TRAY LAPAROSCOPIC (CUSTOM PROCEDURE TRAY) ×3 IMPLANT
TROCAR XCEL BLUNT TIP 100MML (ENDOMECHANICALS) ×3 IMPLANT
TROCAR XCEL NON-BLD 11X100MML (ENDOMECHANICALS) ×3 IMPLANT
TROCAR XCEL NON-BLD 5MMX100MML (ENDOMECHANICALS) ×3 IMPLANT

## 2014-05-16 NOTE — Anesthesia Postprocedure Evaluation (Signed)
  Anesthesia Post-op Note  Patient: Paul Reeves  Procedure(s) Performed: Procedure(s): LAPAROSCOPIC CHOLECYSTECTOMY WITH INTRAOPERATIVE CHOLANGIOGRAM (N/A)  Patient Location: PACU  Anesthesia Type:General  Level of Consciousness: awake, alert  and oriented  Airway and Oxygen Therapy: Patient Spontanous Breathing  Post-op Pain: mild  Post-op Assessment: Post-op Vital signs reviewed, Respiratory Function Stable and Patent Airway  Post-op Vital Signs: Reviewed and stable  Last Vitals:  Filed Vitals:   05/16/14 1131  BP: 153/105  Pulse: 54  Temp: 36.4 C  Resp:     Complications: No apparent anesthesia complications

## 2014-05-16 NOTE — Anesthesia Procedure Notes (Signed)
Procedure Name: Intubation Date/Time: 05/16/2014 9:05 AM Performed by: Coralee RudFLORES, Dola Lunsford Pre-anesthesia Checklist: Patient identified, Emergency Drugs available, Suction available and Patient being monitored Patient Re-evaluated:Patient Re-evaluated prior to inductionOxygen Delivery Method: Circle system utilized Preoxygenation: Pre-oxygenation with 100% oxygen Intubation Type: IV induction Ventilation: Mask ventilation without difficulty Laryngoscope Size: Miller and 3 Grade View: Grade I Tube type: Oral Tube size: 7.5 mm Number of attempts: 1 Airway Equipment and Method: Stylet Placement Confirmation: ETT inserted through vocal cords under direct vision,  positive ETCO2 and breath sounds checked- equal and bilateral Secured at: 22 cm Tube secured with: Tape Dental Injury: Teeth and Oropharynx as per pre-operative assessment

## 2014-05-16 NOTE — Progress Notes (Signed)
Day of Surgery  Subjective: No abdominal tenderness today MRCP - no CBD obstruction TBili decreasing  Objective: Vital signs in last 24 hours: Temp:  [98 F (36.7 C)-98.4 F (36.9 C)] 98 F (36.7 C) (08/21 0631) Pulse Rate:  [55-72] 56 (08/21 0631) Resp:  [15-20] 20 (08/21 0631) BP: (119-153)/(70-108) 149/94 mmHg (08/21 0631) SpO2:  [98 %-100 %] 100 % (08/21 0631) Last BM Date: 05/13/14  Intake/Output from previous day: 08/20 0701 - 08/21 0700 In: 3510.8 [P.O.:1080; I.V.:2430.8] Out: 1925 [Urine:1925] Intake/Output this shift:    General appearance: alert, cooperative and no distress GI: soft, non-tender  Lab Results:   Recent Labs  05/14/14 0830 05/15/14 0420  WBC 5.3 4.1  HGB 13.6 13.3  HCT 39.2 38.9*  PLT 247 223   BMET  Recent Labs  05/15/14 0420 05/16/14 0400  NA 143 144  K 3.3* 3.8  CL 104 106  CO2 29 26  GLUCOSE 90 79  BUN 13 8  CREATININE 1.55* 1.52*  CALCIUM 8.9 9.0   PT/INR  Recent Labs  05/14/14 0830  LABPROT 14.1  INR 1.09   Hepatic Function Latest Ref Rng 05/16/2014 05/15/2014 05/14/2014  Total Protein 6.0 - 8.3 g/dL 6.3 6.3 6.5  Albumin 3.5 - 5.2 g/dL 3.2(L) 3.2(L) 3.4(L)  AST 0 - 37 U/L 141(H) 259(H) 568(H)  ALT 0 - 53 U/L 341(H) 451(H) 655(H)  Alk Phosphatase 39 - 117 U/L 127(H) 135(H) 132(H)  Total Bilirubin 0.3 - 1.2 mg/dL 2.7(H) 4.4(H) 5.0(H)  Bilirubin, Direct 0.0 - 0.3 mg/dL - - 3.5(H)     ABG No results found for this basename: PHART, PCO2, PO2, HCO3,  in the last 72 hours  Studies/Results: Mr Abdomen Mrcp Wo Cm  05/15/2014   CLINICAL DATA:  Left upper quadrant abdominal pain with elevated liver function studies and cholelithiasis on ultrasound.  EXAM: MRI ABDOMEN WITHOUT  (INCLUDING MRCP)  TECHNIQUE: Multiplanar multisequence MR imaging of the abdomen was performed. Heavily T2-weighted images of the biliary and pancreatic ducts were obtained, and three-dimensional MRCP images were rendered by post processing.   COMPARISON:  Abdominal pelvic CT 03/13/2014. Ultrasound 03/14/2014 and 08/27/2013.  FINDINGS: Multiple gallstones are again demonstrated. There is mild gallbladder wall thickening which appears chronic and unchanged from ultrasound performed last year. There is no intra or extrahepatic biliary dilatation. There is no evidence of choledocholithiasis.  The pancreatic ductal anatomy is not well delineated. However, there is no pancreatic ductal dilatation or evidence of pancreas divisum. The pancreas appears normal without surrounding inflammation.  The liver, spleen, adrenal glands and kidneys appear normal. The appendix is visualized and appears normal. There is no evidence of lymphadenopathy.  IMPRESSION: 1. Cholelithiasis with nonspecific chronic mild gallbladder wall thickening. 2. No evidence of biliary dilatation or choledocholithiasis. 3. The pancreas appears normal.   Electronically Signed   By: Camie Patience M.D.   On: 05/15/2014 08:17    Anti-infectives: Anti-infectives   Start     Dose/Rate Route Frequency Ordered Stop   05/14/14 1000  piperacillin-tazobactam (ZOSYN) IVPB 3.375 g     3.375 g 12.5 mL/hr over 240 Minutes Intravenous 3 times per day 05/14/14 0605     05/14/14 0315  piperacillin-tazobactam (ZOSYN) IVPB 3.375 g     3.375 g 12.5 mL/hr over 240 Minutes Intravenous  Once 05/14/14 0305 05/14/14 0734      Assessment/Plan: s/p Procedure(s): LAPAROSCOPIC CHOLECYSTECTOMY WITH INTRAOPERATIVE CHOLANGIOGRAM (N/A) Plan surgery today.   The surgical procedure has been discussed with the patient.  Potential  risks, benefits, alternative treatments, and expected outcomes have been explained.  All of the patient's questions at this time have been answered.  The likelihood of reaching the patient's treatment goal is good.  The patient understand the proposed surgical procedure and wishes to proceed.   LOS: 2 days    Paul Prom K. 05/16/2014

## 2014-05-16 NOTE — Op Note (Signed)
Laparoscopic Cholecystectomy with IOC Procedure Note  Indications: This patient presents with symptomatic gallbladder disease and will undergo laparoscopic cholecystectomy.  Pre-operative Diagnosis: Calculus of gallbladder with other cholecystitis and obstruction  Post-operative Diagnosis: Same  Surgeon: Kathyleen Radice K.   Assistants: none  Anesthesia: General endotracheal anesthesia  ASA Class: 2  Procedure Details  The patient was seen again in the Holding Room. The risks, benefits, complications, treatment options, and expected outcomes were discussed with the patient. The possibilities of reaction to medication, pulmonary aspiration, perforation of viscus, bleeding, recurrent infection, finding a normal gallbladder, the need for additional procedures, failure to diagnose a condition, the possible need to convert to an open procedure, and creating a complication requiring transfusion or operation were discussed with the patient. The likelihood of improving the patient's symptoms with return to their baseline status is good.  The patient and/or family concurred with the proposed plan, giving informed consent. The site of surgery properly noted. The patient was taken to Operating Room, identified as Paul Reeves and the procedure verified as Laparoscopic Cholecystectomy with Intraoperative Cholangiogram. A Time Out was held and the above information confirmed.  Prior to the induction of general anesthesia, antibiotic prophylaxis was administered. General endotracheal anesthesia was then administered and tolerated well. After the induction, the abdomen was prepped with Chloraprep and draped in the sterile fashion. The patient was positioned in the supine position.  Local anesthetic agent was injected into the skin above the umbilicus and an incision made. We dissected down to the abdominal fascia with blunt dissection.  The fascia was incised vertically and we entered the peritoneal cavity  bluntly.  A pursestring suture of 0-Vicryl was placed around the fascial opening.  The Hasson cannula was inserted and secured with the stay suture.  Pneumoperitoneum was then created with CO2 and tolerated well without any adverse changes in the patient's vital signs. An 11-mm port was placed in the subxiphoid position.  Two 5-mm ports were placed in the right upper quadrant. All skin incisions were infiltrated with a local anesthetic agent before making the incision and placing the trocars.   We positioned the patient in reverse Trendelenburg, tilted slightly to the patient's left.  The gallbladder was identified, the fundus grasped and retracted cephalad. Adhesions were lysed bluntly and with the electrocautery where indicated, taking care not to injure any adjacent organs or viscus. The infundibulum was grasped and retracted laterally, exposing the peritoneum overlying the triangle of Calot. This was then divided and exposed in a blunt fashion. A critical view of the cystic duct and cystic artery was obtained.  The cystic duct was clearly identified and bluntly dissected circumferentially. The cystic duct was ligated with a clip distally.   An incision was made in the cystic duct and the [facility] chol[facility] catheter introduced. The catheter was secured using a clip. A cholangiogram was then obtained which showed good visualization of the distal and proximal biliary tree with no sign of filling defects or obstruction.  Contrast flowed easily into the duodenum. The catheter was then removed.   The cystic duct was then ligated with clips and divided. The cystic artery was identified, dissected free, ligated with clips and divided as well.   The gallbladder was dissected from the liver bed in retrograde fashion with the electrocautery. The gallbladder was removed and placed in an Endocatch sac. The liver bed was irrigated and inspected. Hemostasis was achieved with the electrocautery. Copious irrigation was  utilized and was repeatedly aspirated until clear.  The  gallbladder and Endocatch sac were then removed through the umbilical port site.  The pursestring suture was used to close the umbilical fascia.    We again inspected the right upper quadrant for hemostasis.  Pneumoperitoneum was released as we removed the trocars.  4-0 Monocryl was used to close the skin.   Benzoin, steri-strips, and clean dressings were applied. The patient was then extubated and brought to the recovery room in stable condition. Instrument, sponge, and needle counts were correct at closure and at the conclusion of the case.   Findings: Cholecystitis with Cholelithiasis  Estimated Blood Loss: Minimal         Drains: none         Specimens: Gallbladder           Complications: None; patient tolerated the procedure well.         Disposition: PACU - hemodynamically stable.         Condition: stable  Wilmon Arms. Corliss Skains, MD, [facility] Surgery  General/ Trauma Surgery  05/16/2014 10:13 AM

## 2014-05-16 NOTE — Discharge Planning (Signed)
[facility] Liaison was not able to see the patient, [facility] orange card information will be sent to the address listed °

## 2014-05-16 NOTE — Progress Notes (Signed)
[facility] PROGRESS NOTE  Paul Reeves ZOX:[id number] DOB: 25-Jan-1986 DOA: 05/14/2014 PCP: Sissy HoffSWAYNE,DAVID W, MD  Assessment/Plan: Principal Problem:   Abdominal pain Active Problems:   Hypertension   Choledocholithiasis   Elevated LFTs   Cholelithiasis/transaminitis and cholecystitis  Status post laparoscopic cholecystectomy on 8/21 Appreciate general surgery and GI input  MRCP results no CBD stone, apparently no choledocholithiasis  -zosyn  -IV hydration  Rest of the management as per surgery  Hypertension  patient on when necessary labetalol 10 mg IV every 6 hourly with when necessary IV hydralazine 10 mg IV every 4 hourly for systolic blood pressure more than 160   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 Patient is able to ambulate and does not have any significant residual deficit   Chronic kidney disease stage II - creatinine appears to be at baseline. Closely follow metabolic panel and intake output.   Code Status: full  Family Communication: family updated about patient's clinical progress  Disposition Plan: As above   Brief narrative:  year old male with a PMH of ICH, CVA, HTN, and cholelithiasis who is admitted for recurrent LUQ abdominal pain. The patient was in the ER on Tuesday with similar complaints, but his symptoms resolved. Unfortunately he started to have a recurrence his symptoms a few hours after his discharge, and this time his liver enzymes increased as well as his TB. He was evaluated in the hospital on 08/2013 with the similar symptoms. Aside from the cholelithiasis no other abnormalities were noted and he was supposed to follow up with Surgery as an outpatient. The CT scan and the current US do no reveal any evidence of CBD dilation. In fact, the CBD is measured at 4 mm with the US.  Consultants:  Gastroenterology  General surgery  Procedures:  None  Antibiotics:   Unasyn    HPI/Subjective: No complaints  Objective: Filed Vitals:   05/16/14 1045 05/16/14 1055 05/16/14 1100 05/16/14 1131  BP: 146/97  150/95 153/105  Pulse: 54 52 53 54  Temp:    97.6 F (36.4 C)  TempSrc:    Axillary  Resp: 19 9 19    Height:      Weight:      SpO2: 99% 100% 100% 100%    Intake/Output Summary (Last 24 hours) at 05/16/14 1354 Last data filed at 05/16/14 0845  Gross per 24 hour  Intake 2416.75 ml  Output   1925 ml  Net 491.75 ml    Exam:  General: alert & oriented x 3 In NAD  Cardiovascular: RRR, nl S1 s2  Respiratory: Decreased breath sounds at the bases, scattered rhonchi, no crackles  Abdomen: soft +BS NT/ND, no masses palpable  Extremities: No cyanosis and no edema      Data Reviewed: Basic Metabolic Panel:  Recent Labs Lab 05/13/14 0525 05/13/14 2357 05/14/14 0830 05/15/14 0420 05/16/14 0400  NA 141 140 144 143 144  K 3.4* 3.7 3.7 3.3* 3.8  CL 102 100 103 104 106  CO2 28 28 28 29 26   GLUCOSE 124* 123* 87 90 79  BUN 18 16 14 13 8   CREATININE 1.49* 1.44* 1.43* 1.55* 1.52*  CALCIUM 10.1 9.6 9.2 8.9 9.0    Liver Function Tests:  Recent Labs Lab 05/13/14 0525 05/13/14 2357 05/14/14 0830 05/15/14 0420 05/16/14 0400  AST 59* 746* 568* 259* 141*  ALT 33 691* 655* 451* 341*  ALKPHOS 62 120* 132* 135* 127*  BILITOT 1.0 4.6*  5.0* 4.4* 2.7*  PROT 7.0 7.3 6.5 6.3 6.3  ALBUMIN 3.8 3.9 3.4* 3.2* 3.2*    Recent Labs Lab 05/13/14 0525 05/13/14 2357  LIPASE 51 76*   No results found for this basename: AMMONIA,  in the last 168 hours  CBC:  Recent Labs Lab 05/13/14 0525 05/14/14 0010 05/14/14 0830 05/15/14 0420  WBC 6.1 5.8 5.3 4.1  NEUTROABS  --  4.6 3.4  --   HGB 14.1 14.6 13.6 13.3  HCT 40.4 41.6 39.2 38.9*  MCV 87.3 87.4 87.5 89.2  PLT 244 259 247 223    Cardiac Enzymes: No results found for this basename: CKTOTAL, CKMB, CKMBINDEX, TROPONINI,  in the last 168 hours BNP (last 3 results) No results found  for this basename: PROBNP,  in the last 8760 hours   CBG:  Recent Labs Lab 05/15/14 1739 05/16/14 0002 05/16/14 0628 05/16/14 1043 05/16/14 1128  GLUCAP 83 145* 96 86 98    Recent Results (from the past 240 hour(s))  SURGICAL PCR SCREEN     Status: None   Collection Time    05/16/14  7:38 AM      Result Value Ref Range Status   MRSA, PCR NEGATIVE  NEGATIVE Final   Staphylococcus aureus NEGATIVE  NEGATIVE Final   Comment:            The Xpert SA Assay (FDA     approved for NASAL specimens     in patients over 65 years of age),     is one component of     a comprehensive surveillance     program.  Test performance has     been validated by [employer] for patients greater     than or equal to 42 year old.     It is not intended     to diagnose infection nor to     guide or monitor treatment.     Studies: Dg Chest 2 View  05/13/2014   CLINICAL DATA:  Pain.  EXAM: CHEST  2 VIEW  COMPARISON:  08/27/2013.  FINDINGS: The heart size and mediastinal contours are within normal limits. Both lungs are clear. The visualized skeletal structures are unremarkable.  IMPRESSION: No active cardiopulmonary disease.  Unchanged from prior exam.   Electronically Signed   By: Maisie Fus  Register   On: 05/13/2014 07:27   Ct Abdomen Pelvis W Contrast  05/13/2014   CLINICAL DATA:  28 year old with acute onset of left upper quadrant pain.  EXAM: CT ABDOMEN AND PELVIS WITH CONTRAST  TECHNIQUE: Multidetector CT imaging of the abdomen and pelvis was performed using the standard protocol following bolus administration of intravenous contrast.  CONTRAST:  OMNIPAQUE IOHEXOL 300 MG/ML  SOLN  COMPARISON:  None.  FINDINGS: The lung bases are clear.  No evidence for free air.  Normal appearance of the liver and portal venous system. The gallbladder is non distended but difficult to exclude mild gallbladder wall thickening. Normal appearance of the pancreas, spleen, adrenal glands and both kidneys. No  significant free fluid or lymphadenopathy. Normal appearance of the prostate and urinary bladder. There is a retrocecal appendix without acute inflammation. There is mild dilatation of the small bowel loops in the mid abdomen. There is no significant bowel wall thickening.  No acute bone abnormality.  IMPRESSION: No acute abnormalities within the abdomen or pelvis.  Mild dilatation of small bowel loops without surrounding inflammatory changes or wall thickening. The small bowel findings are  nonspecific.  Question mild gallbladder wall thickening without gallbladder distention. If this is the area of clinical concern, consider further evaluation with ultrasound.   Electronically Signed   By: Richarda Overlie M.D.   On: 05/13/2014 09:54   Mr Abdomen Mrcp Wo Cm  05/15/2014   CLINICAL DATA:  Left upper quadrant abdominal pain with elevated liver function studies and cholelithiasis on ultrasound.  EXAM: MRI ABDOMEN WITHOUT  (INCLUDING MRCP)  TECHNIQUE: Multiplanar multisequence MR imaging of the abdomen was performed. Heavily T2-weighted images of the biliary and pancreatic ducts were obtained, and three-dimensional MRCP images were rendered by post processing.  COMPARISON:  Abdominal pelvic CT 03/13/2014. Ultrasound 03/14/2014 and 08/27/2013.  FINDINGS: Multiple gallstones are again demonstrated. There is mild gallbladder wall thickening which appears chronic and unchanged from ultrasound performed last year. There is no intra or extrahepatic biliary dilatation. There is no evidence of choledocholithiasis.  The pancreatic ductal anatomy is not well delineated. However, there is no pancreatic ductal dilatation or evidence of pancreas divisum. The pancreas appears normal without surrounding inflammation.  The liver, spleen, adrenal glands and kidneys appear normal. The appendix is visualized and appears normal. There is no evidence of lymphadenopathy.  IMPRESSION: 1. Cholelithiasis with nonspecific chronic mild gallbladder  wall thickening. 2. No evidence of biliary dilatation or choledocholithiasis. 3. The pancreas appears normal.   Electronically Signed   By: Roxy Horseman M.D.   On: 05/15/2014 08:17   US Abdomen Limited Ruq  05/14/2014   CLINICAL DATA:  Right upper quadrant pain.  EXAM: US ABDOMEN LIMITED - RIGHT UPPER QUADRANT  COMPARISON:  CT of the abdomen and pelvis May 13, 2014  FINDINGS: Gallbladder:  Multiple echogenic gallstones with acoustic shadowing measuring up to 11 mm. Gallbladder wall is 3 mm. No pericholecystic fluid. No sonographic Murphy's sign was elicited.  Common bile duct:  Diameter: 4 mm  Liver:  No focal lesion identified. Within normal limits in parenchymal echogenicity. Hepatopetal portal vein.  IMPRESSION: Cholelithiasis, top normal gallbladder wall thickness without corroborative findings of acute cholecystitis.   Electronically Signed   By: Awilda Metro   On: 05/14/2014 02:01    Scheduled Meds: . Melene Muller ON 05/17/2014] enoxaparin (LOVENOX) injection  40 mg Subcutaneous Q24H  . pantoprazole (PROTONIX) IV  40 mg Intravenous Q24H  . piperacillin-tazobactam (ZOSYN)  IV  3.375 g Intravenous 3 times per day  . promethazine       Continuous Infusions: . dextrose 5 % and 0.9% NaCl 75 mL/hr at 05/16/14 1232    Principal Problem:   Abdominal pain Active Problems:   Hypertension   Choledocholithiasis   Elevated LFTs    Time spent: 40 minutes   [facility]  [facility] Pager [phone]. If 7PM-7AM, please contact night-coverage at [email or web address], password [facility] 05/16/2014, 1:54 PM  LOS: 2 days

## 2014-05-16 NOTE — Anesthesia Postprocedure Evaluation (Signed)
  Anesthesia Post-op Note  Patient: Paul Reeves  Procedure(s) Performed: Procedure(s): LAPAROSCOPIC CHOLECYSTECTOMY WITH INTRAOPERATIVE CHOLANGIOGRAM (N/A)  Patient Location: PACU  Anesthesia Type:General  Level of Consciousness: oriented and sedated  Airway and Oxygen Therapy: Patient Spontanous Breathing and Patient connected to nasal cannula oxygen  Post-op Pain: mild  Post-op Assessment: Patient's Cardiovascular Status Stable and Respiratory Function Stable  Post-op Vital Signs: Reviewed and stable  Last Vitals:  Filed Vitals:   05/16/14 1045  BP: 146/97  Pulse: 54  Temp:   Resp: 19    Complications: No apparent anesthesia complications

## 2014-05-16 NOTE — Anesthesia Preprocedure Evaluation (Addendum)
Anesthesia Evaluation   Patient awake    Reviewed: Allergy & Precautions, H&P , NPO status , Patient's Chart, lab work & pertinent test results, reviewed documented beta blocker date and time   Airway Mallampati: II  Neck ROM: Full    Dental  (+) Teeth Intact   Pulmonary  breath sounds clear to auscultation        Cardiovascular hypertension, Pt. on medications +CHF (mild diastolic dysfunction on ECHO.   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    Peds  Hematology   Anesthesia Other Findings   Reproductive/Obstetrics                          Anesthesia Physical Anesthesia Plan  ASA: III  Anesthesia Plan: General   Post-op Pain Management:    Induction: Intravenous  Airway Management Planned: Oral ETT  Additional Equipment:   Intra-op Plan:   Post-operative Plan: Extubation in OR  Informed Consent: I have reviewed the patients History and Physical, chart, labs and discussed the procedure including the risks, benefits and alternatives for the proposed anesthesia with the patient or authorized representative who has indicated his/her understanding and acceptance.     Plan Discussed with:   Anesthesia Plan Comments:         Anesthesia Quick Evaluation

## 2014-05-16 NOTE — Transfer of Care (Signed)
Immediate Anesthesia Transfer of Care Note  Patient: Paul Reeves  Procedure(s) Performed: Procedure(s): LAPAROSCOPIC CHOLECYSTECTOMY WITH INTRAOPERATIVE CHOLANGIOGRAM (N/A)  Patient Location: PACU  Anesthesia Type:General  Level of Consciousness: awake, sedated and patient cooperative  Airway & Oxygen Therapy: Patient Spontanous Breathing and Patient connected to nasal cannula oxygen  Post-op Assessment: Report given to PACU RN and Patient moving all extremities  Post vital signs: Reviewed and stable  Complications: No apparent anesthesia complications

## 2014-05-17 LAB — COMPREHENSIVE METABOLIC PANEL
ALBUMIN: 2.9 g/dL — AB (ref 3.5–5.2)
ALT: 255 U/L — AB (ref 0–53)
AST: 110 U/L — AB (ref 0–37)
Alkaline Phosphatase: 106 U/L (ref 39–117)
Anion gap: 11 (ref 5–15)
BILIRUBIN TOTAL: 1.7 mg/dL — AB (ref 0.3–1.2)
BUN: 10 mg/dL (ref 6–23)
CHLORIDE: 108 meq/L (ref 96–112)
CO2: 25 meq/L (ref 19–32)
Calcium: 8.4 mg/dL (ref 8.4–10.5)
Creatinine, Ser: 1.64 mg/dL — ABNORMAL HIGH (ref 0.50–1.35)
GFR calc Af Amer: 64 mL/min — ABNORMAL LOW (ref >90)
GFR, EST NON AFRICAN AMERICAN: 55 mL/min — AB (ref >90)
Glucose, Bld: 96 mg/dL (ref 70–99)
Potassium: 3.5 mEq/L — ABNORMAL LOW (ref 3.7–5.3)
SODIUM: 144 meq/L (ref 137–147)
Total Protein: 5.9 g/dL — ABNORMAL LOW (ref 6.0–8.3)

## 2014-05-17 LAB — GLUCOSE, CAPILLARY
Glucose-Capillary: 101 mg/dL — ABNORMAL HIGH (ref 70–99)
Glucose-Capillary: 103 mg/dL — ABNORMAL HIGH (ref 70–99)

## 2014-05-17 MED ORDER — OXYCODONE HCL 5 MG PO TABS: 5.0000 mg | ORAL_TABLET | Freq: Four times a day (QID) | ORAL | Status: DC | PRN

## 2014-05-17 MED ORDER — PANTOPRAZOLE SODIUM 40 MG PO TBEC: 40.0000 mg | DELAYED_RELEASE_TABLET | Freq: Every day | ORAL | Status: DC

## 2014-05-17 MED ORDER — PANTOPRAZOLE SODIUM 40 MG PO TBEC: 40.0000 mg | DELAYED_RELEASE_TABLET | Freq: Every day | ORAL | Status: AC

## 2014-05-17 MED ORDER — HYDROCODONE-ACETAMINOPHEN 5-325 MG PO TABS: 1.0000 | ORAL_TABLET | Freq: Four times a day (QID) | ORAL | Status: DC | PRN

## 2014-05-17 MED ORDER — AMOXICILLIN-POT CLAVULANATE 875-125 MG PO TABS: 1.0000 | ORAL_TABLET | Freq: Two times a day (BID) | ORAL | Status: AC

## 2014-05-17 NOTE — Progress Notes (Signed)
1 Day Post-Op  Subjective: Sore but no n/v. States he did well with liquids  Objective: Vital signs in last 24 hours: Temp:  [97.6 F (36.4 C)-99.9 F (37.7 C)] 98.3 F (36.8 C) (08/22 0556) Pulse Rate:  [52-80] 69 (08/22 0556) Resp:  [9-19] 16 (08/22 0556) BP: (122-153)/(78-112) 133/90 mmHg (08/22 0556) SpO2:  [99 %-100 %] 100 % (08/22 0556) Last BM Date: 05/13/14  Intake/Output from previous day: 08/21 0701 - 08/22 0700 In: 495 [P.O.:120] Out: 1100 [Urine:1100] Intake/Output this shift:    Alert, nad cta b/l Reg Soft, approp TTP, some drainage on umbilical gauze; nd   Lab Results:   Recent Labs  05/15/14 0420 05/16/14 1346  WBC 4.1 8.9  HGB 13.3 14.2  HCT 38.9* 40.7  PLT 223 236   BMET  Recent Labs  05/16/14 0400 05/16/14 1346 05/17/14 0557  NA 144  --  144  K 3.8  --  3.5*  CL 106  --  108  CO2 26  --  25  GLUCOSE 79  --  96  BUN 8  --  10  CREATININE 1.52* 1.33 1.64*  CALCIUM 9.0  --  8.4   Hepatic Function Latest Ref Rng 05/17/2014 05/16/2014 05/15/2014  Total Protein 6.0 - 8.3 g/dL 5.9(L) 6.3 6.3  Albumin 3.5 - 5.2 g/dL 2.9(L) 3.2(L) 3.2(L)  AST 0 - 37 U/L 110(H) 141(H) 259(H)  ALT 0 - 53 U/L 255(H) 341(H) 451(H)  Alk Phosphatase 39 - 117 U/L 106 127(H) 135(H)  Total Bilirubin 0.3 - 1.2 mg/dL 1.7(H) 2.7(H) 4.4(H)  Bilirubin, Direct 0.0 - 0.3 mg/dL - - -     PT/INR  Recent Labs  05/14/14 0830  LABPROT 14.1  INR 1.09   ABG No results found for this basename: PHART, PCO2, PO2, HCO3,  in the last 72 hours  Studies/Results: Dg Cholangiogram Operative  05/16/2014   CLINICAL DATA:  Cholecystectomy for cholelithiasis and cholecystitis.  EXAM: INTRAOPERATIVE CHOLANGIOGRAM  TECHNIQUE: Cholangiographic images from the C-arm fluoroscopic device were submitted for interpretation post-operatively. Please see the procedural report for the amount of contrast and the fluoroscopy time utilized.  COMPARISON:  Ultrasound on 05/14/2014.  FINDINGS: Imaging  from an intraoperative cholangiogram shows a normal opacified biliary tree without evidence of obstruction, filling defect or extravasation. Contrast is seen to enter the duodenum.  IMPRESSION: Normal intraoperative cholangiogram.   Electronically Signed   By: Aletta Edouard M.D.   On: 05/16/2014 14:01    Anti-infectives: Anti-infectives   Start     Dose/Rate Route Frequency Ordered Stop   05/14/14 1000  piperacillin-tazobactam (ZOSYN) IVPB 3.375 g     3.375 g 12.5 mL/hr over 240 Minutes Intravenous 3 times per day 05/14/14 0605     05/14/14 0315  piperacillin-tazobactam (ZOSYN) IVPB 3.375 g     3.375 g 12.5 mL/hr over 240 Minutes Intravenous  Once 05/14/14 0305 05/14/14 0734      Assessment/Plan: s/p Procedure(s): LAPAROSCOPIC CHOLECYSTECTOMY WITH INTRAOPERATIVE CHOLANGIOGRAM (N/A) LFTs trending down No sign of cholecystectomy complication If tolerates breakfast, can be discharged from surgical point of view Discussed dc instructions with pt.  F/u [facility] in 2-3 weeks  Leighton Ruff. Redmond Pulling, MD, FACS General, Bariatric, & Minimally Invasive Surgery [facility], [place]   LOS: 3 days    Paul Reeves 05/17/2014

## 2014-05-17 NOTE — Discharge Summary (Addendum)
Physician Discharge Summary  Paul Reeves MRN: [id number] DOB/AGE: [birth year] 28 y.o.  PCP: Gara Kroner, MD   Admit date: 05/14/2014 Discharge date: 05/17/2014  Discharge Diagnoses:   Calculus of gallbladder with other cholecystitis and obstruction laparoscopic cholecystectomy    Abdominal pain Active Problems:   Hypertension   Choledocholithiasis   Elevated LFTs  Follow recommendations Follow up with PCP in 1 week Follow up with general surgery as scheduled Repeat CBC and CMP in 1 week    Medication List         amLODipine 10 MG tablet  Commonly known as:  NORVASC  Take 1 tablet (10 mg total) by mouth at bedtime.     amoxicillin-clavulanate 875-125 MG per tablet  Commonly known as:  AUGMENTIN  Take 1 tablet by mouth 2 (two) times daily.     famotidine 20 MG tablet  Commonly known as:  PEPCID  Take 1 tablet (20 mg total) by mouth 2 (two) times daily.     hydrochlorothiazide 25 MG tablet  Commonly known as:  HYDRODIURIL  Take 1 tablet (25 mg total) by mouth daily.     HYDROcodone-acetaminophen 5-325 MG per tablet  Commonly known as:  NORCO/VICODIN  Take 1-2 tablets by mouth every 6 (six) hours as needed.     labetalol 300 MG tablet  Commonly known as:  NORMODYNE  Take 1 tablet (300 mg total) by mouth 2 (two) times daily.     losartan 50 MG tablet  Commonly known as:  COZAAR  Take 1 tablet (50 mg total) by mouth daily.     pantoprazole 40 MG tablet  Commonly known as:  PROTONIX  Take 1 tablet (40 mg total) by mouth daily.  Start taking on:  05/18/2014        Discharge Condition: Stable  Disposition: 01-Home or Self Care   Consults:  General surgery Gastroenterology  Significant Diagnostic Studies: Dg Chest 2 View  05/13/2014   CLINICAL DATA:  Pain.  EXAM: CHEST  2 VIEW  COMPARISON:  08/27/2013.  FINDINGS: The heart size and mediastinal contours are within normal limits. Both lungs are clear. The visualized skeletal structures are  unremarkable.  IMPRESSION: No active cardiopulmonary disease.  Unchanged from prior exam.   Electronically Signed   By: Marcello Moores  Register   On: 05/13/2014 07:27   Dg Cholangiogram Operative  05/16/2014   CLINICAL DATA:  Cholecystectomy for cholelithiasis and cholecystitis.  EXAM: INTRAOPERATIVE CHOLANGIOGRAM  TECHNIQUE: Cholangiographic images from the C-arm fluoroscopic device were submitted for interpretation post-operatively. Please see the procedural report for the amount of contrast and the fluoroscopy time utilized.  COMPARISON:  Ultrasound on 05/14/2014.  FINDINGS: Imaging from an intraoperative cholangiogram shows a normal opacified biliary tree without evidence of obstruction, filling defect or extravasation. Contrast is seen to enter the duodenum.  IMPRESSION: Normal intraoperative cholangiogram.   Electronically Signed   By: Aletta Edouard M.D.   On: 05/16/2014 14:01   Ct Abdomen Pelvis W Contrast  05/13/2014   CLINICAL DATA:  28 year old with acute onset of left upper quadrant pain.  EXAM: CT ABDOMEN AND PELVIS WITH CONTRAST  TECHNIQUE: Multidetector CT imaging of the abdomen and pelvis was performed using the standard protocol following bolus administration of intravenous contrast.  CONTRAST:  155mL OMNIPAQUE IOHEXOL 300 MG/ML  SOLN  COMPARISON:  None.  FINDINGS: The lung bases are clear.  No evidence for free air.  Normal appearance of the liver and portal venous system. The gallbladder is non distended  but difficult to exclude mild gallbladder wall thickening. Normal appearance of the pancreas, spleen, adrenal glands and both kidneys. No significant free fluid or lymphadenopathy. Normal appearance of the prostate and urinary bladder. There is a retrocecal appendix without acute inflammation. There is mild dilatation of the small bowel loops in the mid abdomen. There is no significant bowel wall thickening.  No acute bone abnormality.  IMPRESSION: No acute abnormalities within the abdomen or  pelvis.  Mild dilatation of small bowel loops without surrounding inflammatory changes or wall thickening. The small bowel findings are nonspecific.  Question mild gallbladder wall thickening without gallbladder distention. If this is the area of clinical concern, consider further evaluation with ultrasound.   Electronically Signed   By: Markus Daft M.D.   On: 05/13/2014 09:54   Mr Abdomen Mrcp Wo Cm  05/15/2014   CLINICAL DATA:  Left upper quadrant abdominal pain with elevated liver function studies and cholelithiasis on ultrasound.  EXAM: MRI ABDOMEN WITHOUT  (INCLUDING MRCP)  TECHNIQUE: Multiplanar multisequence MR imaging of the abdomen was performed. Heavily T2-weighted images of the biliary and pancreatic ducts were obtained, and three-dimensional MRCP images were rendered by post processing.  COMPARISON:  Abdominal pelvic CT 03/13/2014. Ultrasound 03/14/2014 and 08/27/2013.  FINDINGS: Multiple gallstones are again demonstrated. There is mild gallbladder wall thickening which appears chronic and unchanged from ultrasound performed last year. There is no intra or extrahepatic biliary dilatation. There is no evidence of choledocholithiasis.  The pancreatic ductal anatomy is not well delineated. However, there is no pancreatic ductal dilatation or evidence of pancreas divisum. The pancreas appears normal without surrounding inflammation.  The liver, spleen, adrenal glands and kidneys appear normal. The appendix is visualized and appears normal. There is no evidence of lymphadenopathy.  IMPRESSION: 1. Cholelithiasis with nonspecific chronic mild gallbladder wall thickening. 2. No evidence of biliary dilatation or choledocholithiasis. 3. The pancreas appears normal.   Electronically Signed   By: Camie Patience M.D.   On: 05/15/2014 08:17   US Abdomen Limited Ruq  05/14/2014   CLINICAL DATA:  Right upper quadrant pain.  EXAM: US ABDOMEN LIMITED - RIGHT UPPER QUADRANT  COMPARISON:  CT of the abdomen and pelvis  May 13, 2014  FINDINGS: Gallbladder:  Multiple echogenic gallstones with acoustic shadowing measuring up to 11 mm. Gallbladder wall is 3 mm. No pericholecystic fluid. No sonographic Murphy's sign was elicited.  Common bile duct:  Diameter: 4 mm  Liver:  No focal lesion identified. Within normal limits in parenchymal echogenicity. Hepatopetal portal vein.  IMPRESSION: Cholelithiasis, top normal gallbladder wall thickness without corroborative findings of acute cholecystitis.   Electronically Signed   By: Elon Alas   On: 05/14/2014 02:01       Microbiology: Recent Results (from the past 240 hour(s))  SURGICAL PCR SCREEN     Status: None   Collection Time    05/16/14  7:38 AM      Result Value Ref Range Status   MRSA, PCR NEGATIVE  NEGATIVE Final   Staphylococcus aureus NEGATIVE  NEGATIVE Final   Comment:            The Xpert SA Assay (FDA     approved for NASAL specimens     in patients over 49 years of age),     is one component of     a comprehensive surveillance     program.  Test performance has     been validated by [employer] for patients  greater     than or equal to 48 year old.     It is not intended     to diagnose infection nor to     guide or monitor treatment.     Labs: Results for orders placed during the hospital encounter of 05/14/14 (from the past 48 hour(s))  GLUCOSE, CAPILLARY     Status: None   Collection Time    05/15/14 12:03 PM      Result Value Ref Range   Glucose-Capillary 76  70 - 99 mg/dL  GLUCOSE, CAPILLARY     Status: None   Collection Time    05/15/14  5:39 PM      Result Value Ref Range   Glucose-Capillary 83  70 - 99 mg/dL  GLUCOSE, CAPILLARY     Status: Abnormal   Collection Time    05/16/14 12:02 AM      Result Value Ref Range   Glucose-Capillary 145 (*) 70 - 99 mg/dL  COMPREHENSIVE METABOLIC PANEL     Status: Abnormal   Collection Time    05/16/14  4:00 AM      Result Value Ref Range   Sodium 144  137 - 147 mEq/L    Potassium 3.8  3.7 - 5.3 mEq/L   Chloride 106  96 - 112 mEq/L   CO2 26  19 - 32 mEq/L   Glucose, Bld 79  70 - 99 mg/dL   BUN 8  6 - 23 mg/dL   Creatinine, Ser 1.52 (*) 0.50 - 1.35 mg/dL   Calcium 9.0  8.4 - 10.5 mg/dL   Total Protein 6.3  6.0 - 8.3 g/dL   Albumin 3.2 (*) 3.5 - 5.2 g/dL   AST 141 (*) 0 - 37 U/L   ALT 341 (*) 0 - 53 U/L   Alkaline Phosphatase 127 (*) 39 - 117 U/L   Total Bilirubin 2.7 (*) 0.3 - 1.2 mg/dL   GFR calc non Af Amer 60 (*) >90 mL/min   GFR calc Af Amer 70 (*) >90 mL/min   Comment: (NOTE)     The eGFR has been calculated using the CKD EPI equation.     This calculation has not been validated in all clinical situations.     eGFR's persistently <90 mL/min signify possible Chronic Kidney     Disease.   Anion gap 12  5 - 15  GLUCOSE, CAPILLARY     Status: None   Collection Time    05/16/14  6:28 AM      Result Value Ref Range   Glucose-Capillary 96  70 - 99 mg/dL  SURGICAL PCR SCREEN     Status: None   Collection Time    05/16/14  7:38 AM      Result Value Ref Range   MRSA, PCR NEGATIVE  NEGATIVE   Staphylococcus aureus NEGATIVE  NEGATIVE   Comment:            The Xpert SA Assay (FDA     approved for NASAL specimens     in patients over 59 years of age),     is one component of     a comprehensive surveillance     program.  Test performance has     been validated by [employer] for patients greater     than or equal to 36 year old.     It is not intended     to diagnose infection nor to  guide or monitor treatment.  GLUCOSE, CAPILLARY     Status: None   Collection Time    05/16/14 10:43 AM      Result Value Ref Range   Glucose-Capillary 86  70 - 99 mg/dL   Comment 1 Notify RN    GLUCOSE, CAPILLARY     Status: None   Collection Time    05/16/14 11:28 AM      Result Value Ref Range   Glucose-Capillary 98  70 - 99 mg/dL  CBC     Status: None   Collection Time    05/16/14  1:46 PM      Result Value Ref Range   WBC 8.9  4.0 - 10.5  K/uL   RBC 4.58  4.22 - 5.81 MIL/uL   Hemoglobin 14.2  13.0 - 17.0 g/dL   HCT 40.7  39.0 - 52.0 %   MCV 88.9  78.0 - 100.0 fL   MCH 31.0  26.0 - 34.0 pg   MCHC 34.9  30.0 - 36.0 g/dL   RDW 12.2  11.5 - 15.5 %   Platelets 236  150 - 400 K/uL  CREATININE, SERUM     Status: Abnormal   Collection Time    05/16/14  1:46 PM      Result Value Ref Range   Creatinine, Ser 1.33  0.50 - 1.35 mg/dL   GFR calc non Af Amer 71 (*) >90 mL/min   GFR calc Af Amer 82 (*) >90 mL/min   Comment: (NOTE)     The eGFR has been calculated using the CKD EPI equation.     This calculation has not been validated in all clinical situations.     eGFR's persistently <90 mL/min signify possible Chronic Kidney     Disease.  GLUCOSE, CAPILLARY     Status: Abnormal   Collection Time    05/16/14  5:15 PM      Result Value Ref Range   Glucose-Capillary 100 (*) 70 - 99 mg/dL  GLUCOSE, CAPILLARY     Status: Abnormal   Collection Time    05/16/14 11:34 PM      Result Value Ref Range   Glucose-Capillary 134 (*) 70 - 99 mg/dL  COMPREHENSIVE METABOLIC PANEL     Status: Abnormal   Collection Time    05/17/14  5:57 AM      Result Value Ref Range   Sodium 144  137 - 147 mEq/L   Potassium 3.5 (*) 3.7 - 5.3 mEq/L   Chloride 108  96 - 112 mEq/L   CO2 25  19 - 32 mEq/L   Glucose, Bld 96  70 - 99 mg/dL   BUN 10  6 - 23 mg/dL   Creatinine, Ser 1.64 (*) 0.50 - 1.35 mg/dL   Calcium 8.4  8.4 - 10.5 mg/dL   Total Protein 5.9 (*) 6.0 - 8.3 g/dL   Albumin 2.9 (*) 3.5 - 5.2 g/dL   AST 110 (*) 0 - 37 U/L   ALT 255 (*) 0 - 53 U/L   Alkaline Phosphatase 106  39 - 117 U/L   Total Bilirubin 1.7 (*) 0.3 - 1.2 mg/dL   GFR calc non Af Amer 55 (*) >90 mL/min   GFR calc Af Amer 64 (*) >90 mL/min   Comment: (NOTE)     The eGFR has been calculated using the CKD EPI equation.     This calculation has not been validated in all clinical situations.     eGFR's persistently <  90 mL/min signify possible Chronic Kidney     Disease.   Anion  gap 11  5 - 15  GLUCOSE, CAPILLARY     Status: Abnormal   Collection Time    05/17/14  5:57 AM      Result Value Ref Range   Glucose-Capillary 101 (*) 70 - 99 mg/dL     HPI : 28 year old male with a PMH of ICH, CVA, HTN, and cholelithiasis who is admitted for recurrent LUQ abdominal pain. The patient was in the ER on Tuesday with similar complaints, but his symptoms resolved. Unfortunately he started to have a recurrence his symptoms a few hours after his discharge, and this time his liver enzymes increased as well as his TB. He was evaluated in the hospital on 08/2013 with the similar symptoms. Aside from the cholelithiasis no other abnormalities were noted and he was supposed to follow up with Surgery as an outpatient. The CT scan and the current [place] do no reveal any evidence of CBD dilation. In fact, the CBD is measured at 4 mm with the [place].    HOSPITAL COURSE:  Cholelithiasis/transaminitis  Status post evaluation by GI and general surgery MRCP showed cholelithiasis but no evidence of choledocholithiasis, given the improving liver function and no CBD stone, ERCP was deferred Status post laparoscopic cholecystectomy on 8/21 Okay to discharge from a surgical standpoint Because of suspected cholecystitis the patient was started on Unasyn, now being discharged on 7 more days of Augmentin He should follow up with general surgery as per scheduled Followup CMP in 1 week  Hypertension  patient started on prn labetalol 10 mg IV every 6 hourly with when necessary IV hydralazine 10 mg IV every 4 hourly for systolic blood pressure more than 160  Okay to resume outpatient medications Repeat CMP because of chronic kidney disease  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 Patient is able to ambulate and does not have any significant residual deficit    Chronic kidney disease stage II - creatinine appears to be at baseline.  Closely follow metabolic panel outpatient to be arranged for by the PCP  Discharge Exam:   Blood pressure 141/96, pulse 68, temperature 98.3 F (36.8 C), temperature source Oral, resp. rate 16, height $RemoveBe'5\' 11"'IZUGxBUNG$  (1.803 m), weight 80.604 kg (177 lb 11.2 oz), SpO2 100.00%.  General: alert & oriented x 3 In NAD  Cardiovascular: RRR, nl S1 s2  Respiratory: Decreased breath sounds at the bases, scattered rhonchi, no crackles  Abdomen: soft +BS NT/ND, no masses palpable  Extremities: No cyanosis and no edema            Follow-up Information   Follow up with [facility]. Schedule an appointment as soon as possible for a visit in 3 weeks. (For wound re-check)    Contact information:   [place] [address] [phone]       Signed: Reyne Dumas 05/17/2014, 11:20 AM

## 2014-05-17 NOTE — Progress Notes (Signed)
Discharge instructions gone over with patient. Home medications gone over. Prescription given for hydrocodone. Other prescriptions electronically sent to patient's pharmacy. Follow up appointments to be made. Diet, activity, signs and symptoms of worsening condition gone over. Incisional care and bowel regimen gone over. My chart discussed. Patient verbalized understanding of instructions.

## 2014-05-17 NOTE — Discharge Instructions (Signed)
[facility] °LAPAROSCOPIC SURGERY: POST OP INSTRUCTIONS °Always review your discharge instruction sheet given to you by the facility where your surgery was performed. °IF YOU HAVE DISABILITY OR FAMILY LEAVE FORMS, YOU MUST BRING THEM TO THE OFFICE FOR PROCESSING.   °DO NOT GIVE THEM TO YOUR DOCTOR. ° °1. A prescription for pain medication may be given to you upon discharge.  Take your pain medication as prescribed, if needed.  If narcotic pain medicine is not needed, then you may take acetaminophen (Tylenol) &/ or ibuprofen (Advil) as needed. °2. Take your usually prescribed medications unless otherwise directed. °3. If you need a refill on your pain medication, please contact your pharmacy.  They will contact our office to request authorization. Prescriptions will not be filled after 5pm or on week-ends. °4. You should follow a light diet the first few days after arrival home, such as soup and crackers, etc.  Be sure to include lots of fluids daily. °5. Most patients will experience some swelling and bruising in the area of the incisions.  Ice packs will help.  Swelling and bruising can take several days to resolve.  °6. It is common to experience some constipation if taking pain medication after surgery.  Increasing fluid intake and taking a stool softener (such as Colace) will usually help or prevent this problem from occurring.  A mild laxative (Milk of Magnesia or Miralax) should be taken according to package instructions if there are no bowel movements after 48 hours. °7. Unless discharge instructions indicate otherwise, you may remove your bandages 48 hours after surgery, and you may shower at that time.  You  have steri-strips (small skin tapes) in place directly over the incision.  These strips should be left on the skin for 7-10 days.  °8. ACTIVITIES:  You may resume regular (light) daily activities beginning the next day--such as daily self-care, walking, climbing stairs--gradually  increasing activities as tolerated.  You may have sexual intercourse when it is comfortable.  Refrain from any heavy lifting or straining until approved by your doctor. °a. You may drive when you are no longer taking prescription pain medication, you can comfortably wear a seatbelt, and you can safely maneuver your car and apply brakes. °9. You should see your doctor in the office for a follow-up appointment approximately 2-3 weeks after your surgery.  Make sure that you call for this appointment within a day or two after you arrive home to insure a convenient appointment time. °10. OTHER INSTRUCTIONS:  °WHEN TO CALL YOUR DOCTOR: °1. Fever over 101.0 °2. Inability to urinate °3. Continued bleeding from incision. °4. Increased pain, redness, or drainage from the incision. °5. Increasing abdominal pain ° °The clinic staff is available to answer your questions during regular business hours.  Please don’t hesitate to call and ask to speak to one of the nurses for clinical concerns.  If you have a medical emergency, go to the nearest emergency room or call 911.  A surgeon from [facility] is always on call at the hospital. °[address], [address], [place], [place]  [address] ? [address] [address], [place], [place]   [address] °[phone] ? [phone] ? FAX [phone] °Web site: [email or web address] ° ° ° ° ° °

## 2014-05-19 ENCOUNTER — Encounter (HOSPITAL_COMMUNITY): Payer: Self-pay | Admitting: Surgery

## 2014-06-05 ENCOUNTER — Other Ambulatory Visit: Payer: Self-pay | Admitting: Cardiology

## 2014-07-11 ENCOUNTER — Other Ambulatory Visit: Payer: Self-pay

## 2014-10-23 ENCOUNTER — Other Ambulatory Visit: Payer: Self-pay | Admitting: *Deleted

## 2014-10-23 MED ORDER — LOSARTAN POTASSIUM 50 MG PO TABS
50.0000 mg | ORAL_TABLET | Freq: Every day | ORAL | Status: DC
Start: 2014-10-23 — End: 2014-10-30

## 2014-10-30 ENCOUNTER — Other Ambulatory Visit: Payer: Self-pay | Admitting: *Deleted

## 2014-10-30 MED ORDER — LOSARTAN POTASSIUM 50 MG PO TABS: 50.0000 mg | ORAL_TABLET | Freq: Every day | ORAL | Status: AC

## 2015-04-11 IMAGING — CT CT HEAD W/O CM
2 series · 15 of 30 positions shown, 19 images · non-contrast
Comparison: None available at time of study interpretation.

CLINICAL DATA: Code stroke.

CT HEAD WITHOUT CONTRAST
TECHNIQUE: Contiguous axial images were obtained from the base of
the skull through the vertex without contrast.

[Series 3: head w/o · axial · non-contrast · 0.49mm/px · z∈[+105,+235]mm · 13 of 32 slices shown, 17 images]
[im 3/32  brain]
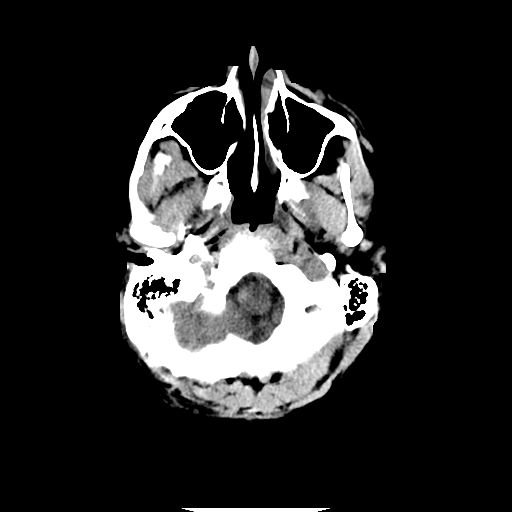
[im 3/32  bone]
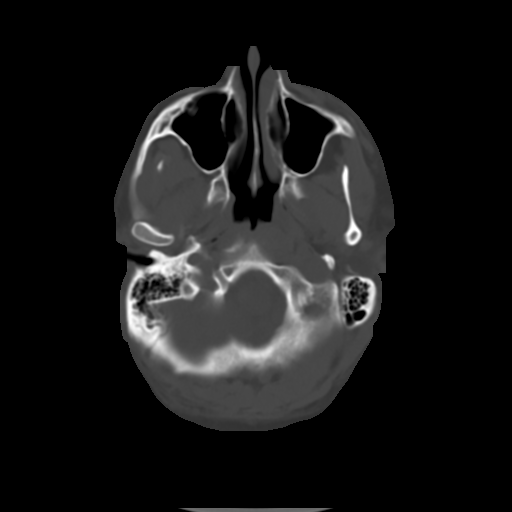
[im 5/32  brain]
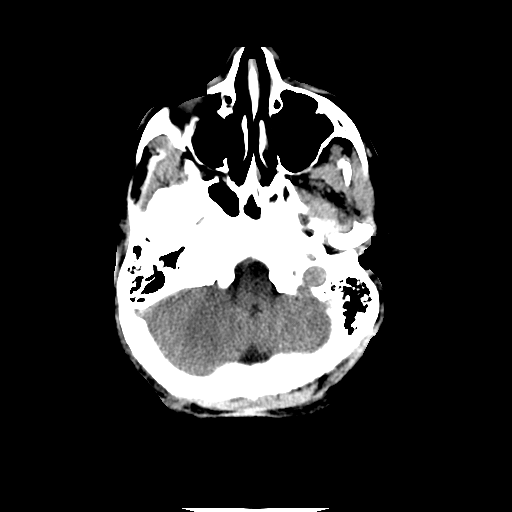
[im 7/32  brain]
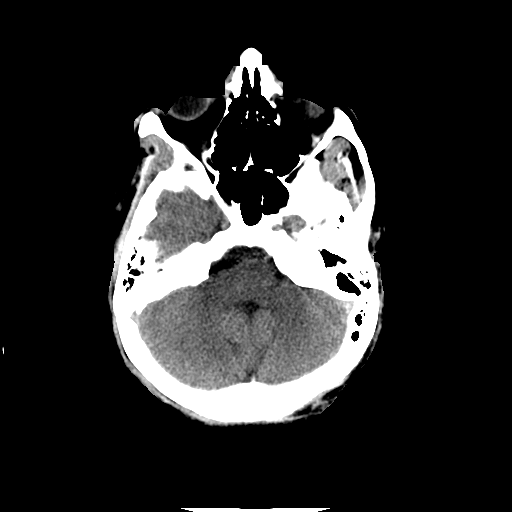
[im 9/32  brain]
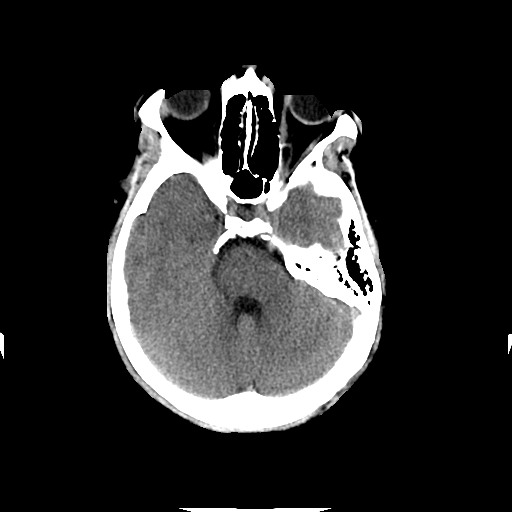
[im 12/32  brain]
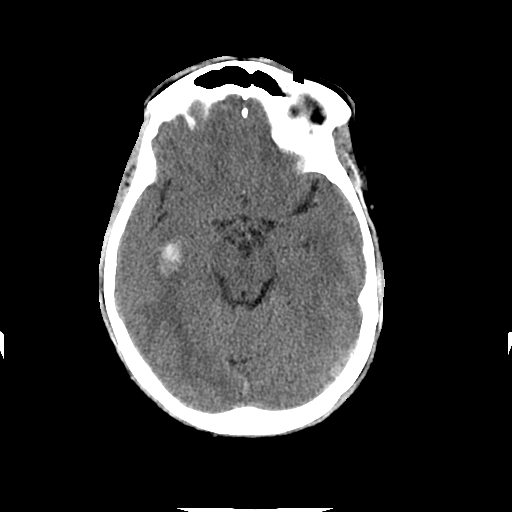
[im 12/32  bone]
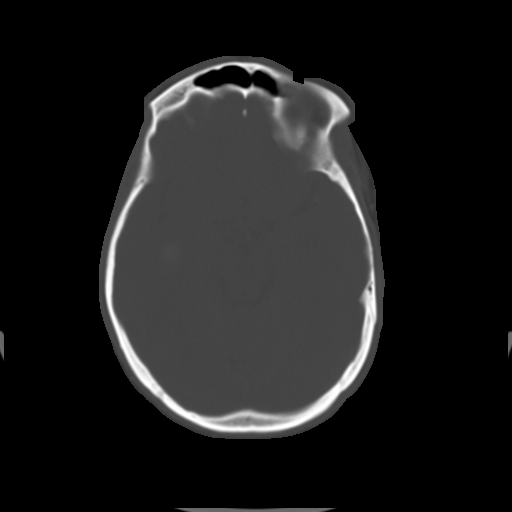
[im 14/32  brain]
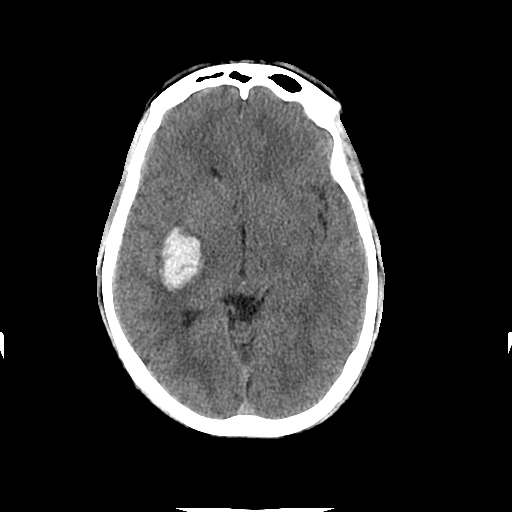
[im 16/32  brain]
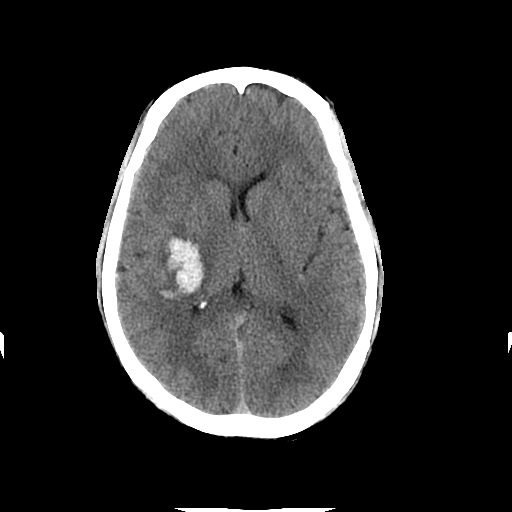
[im 18/32  brain]
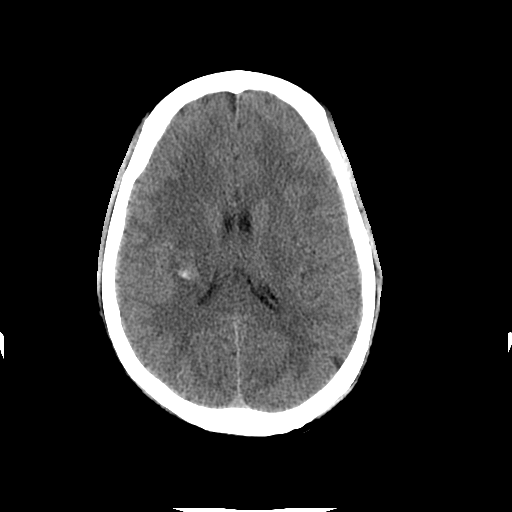
[im 20/32  brain]
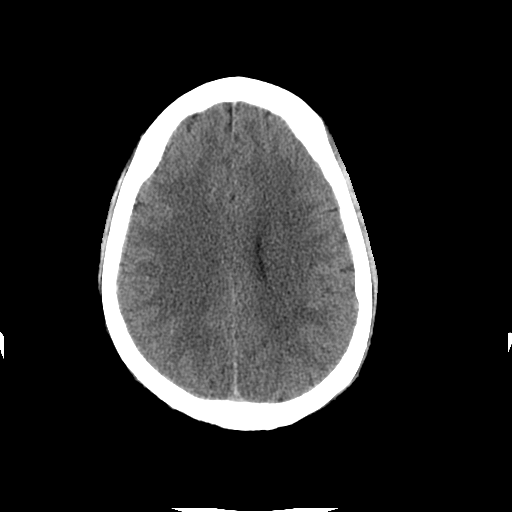
[im 20/32  bone]
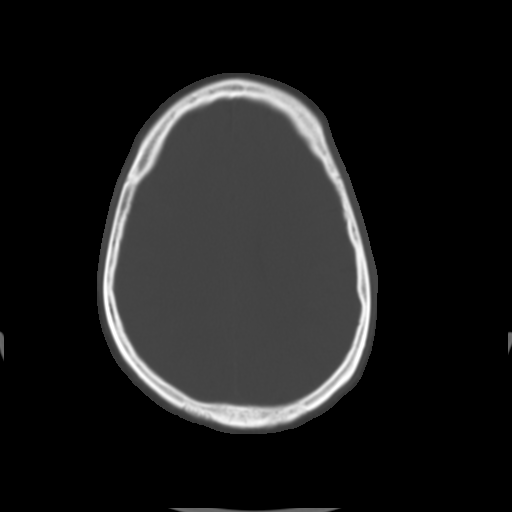
[im 23/32  brain]
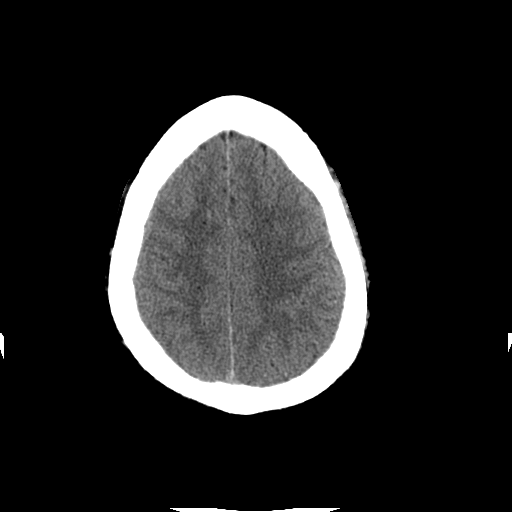
[im 25/32  brain]
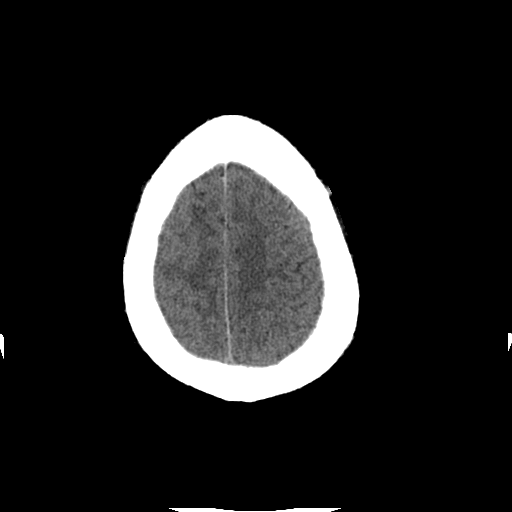
[im 27/32  brain]
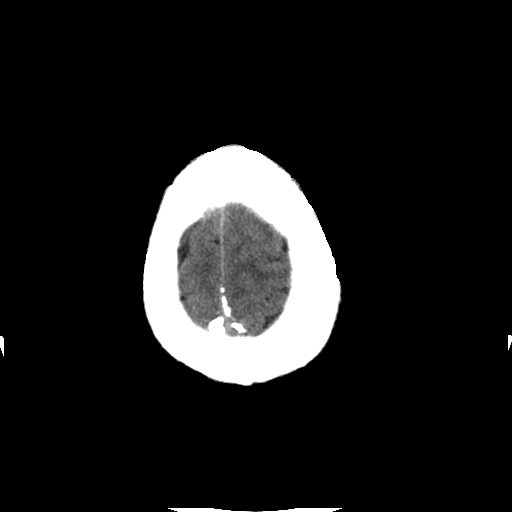
[im 29/32  brain]
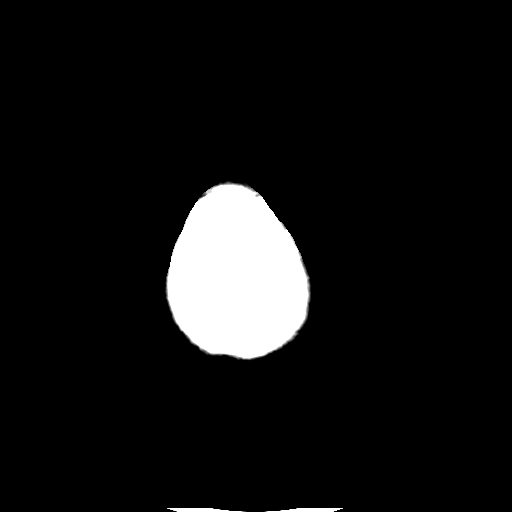
[im 29/32  bone]
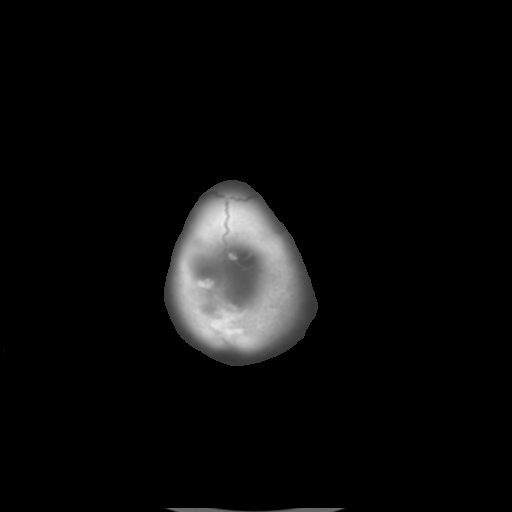

[Series 4: head w/o bone · axial · non-contrast · 0.49mm/px · z∈[+105,+125]mm · 2 of 32 slices shown]
[im 3/32  bone]
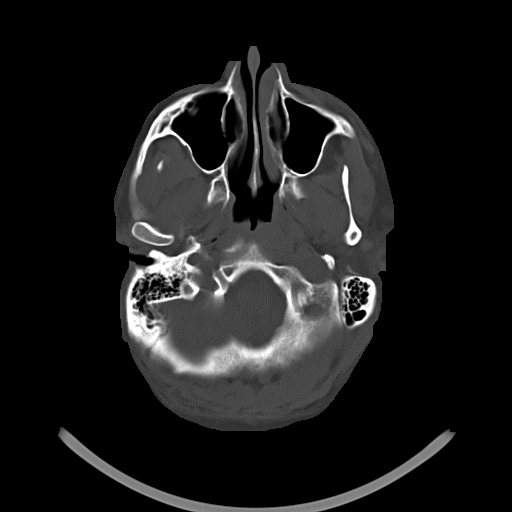
[im 7/32  bone]
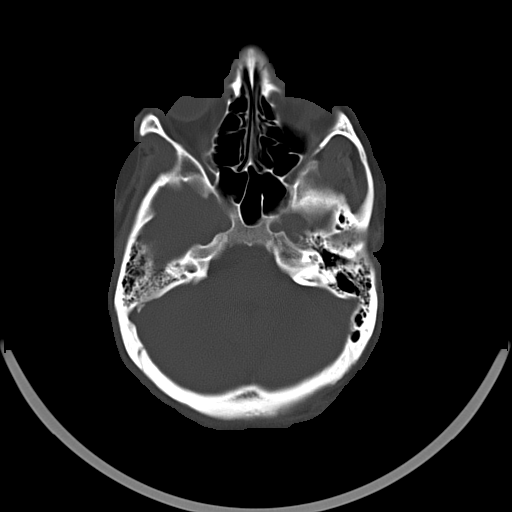

[15 of 30 positions shown; findings below may reference images not displayed]

FINDINGS: 3.2 x 1.9 cm hyperdense intraparenchymal hematoma within
the right posterior basal ganglia/sub insular white matter.  Local
mass effect, and mild surrounding low density vasogenic.

The ventricles and sulci are overall normal for patient's age. No
acute large vascular territory infarct.

No abnormal extra-axial fluid collections.  Basal cisterns are
patent.  Impacted uninterrupted left maxillary molar.  The
paranasal sinuses and mastoid air cells appear well aerated.  No
skull fracture.  Included ocular globes and orbital contents are
not suspicious.
IMPRESSION: 3.2 x 1.9 cm right basal ganglia/sub insular acute intraparenchymal
hematoma with local mass effect, no midline shift.

Critical Value/emergent results were called by telephone at the
time of interpretation on June 26, 2013 at 3823 hours to Dr.
Fey, who verbally acknowledged these results.

## 2015-04-11 IMAGING — CT CT HEAD W/O CM
2 series · 15 of 30 positions shown, 19 images · non-contrast
Comparison: CT head without contrast 06/26/2013 at [DATE] a.m..

CLINICAL DATA: Hemorrhage. Hypertension.  Improving headache.

EXAM:
CT HEAD WITHOUT CONTRAST
TECHNIQUE: Contiguous axial images were obtained from the base of the skull
through the vertex without intravenous contrast.

[Series 2: head w/o · axial · non-contrast · 0.49mm/px · z∈[+1207,+1337]mm · 13 of 32 slices shown, 17 images]
[im 3/32  brain]
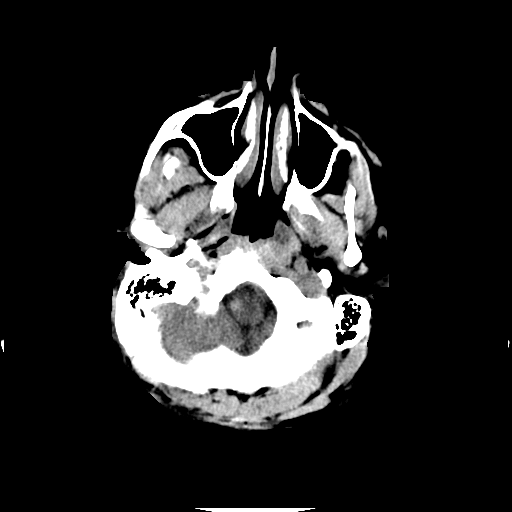
[im 3/32  bone]
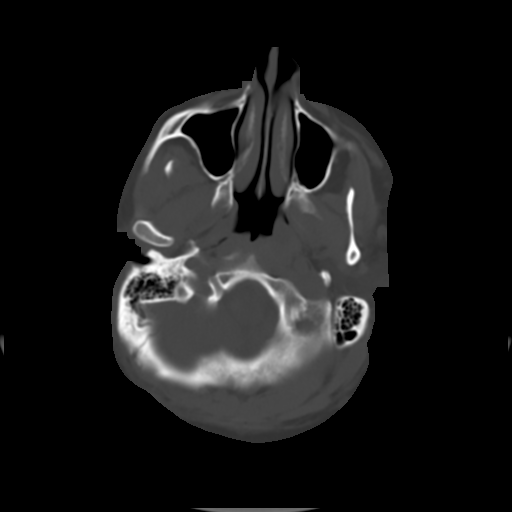
[im 5/32  brain]
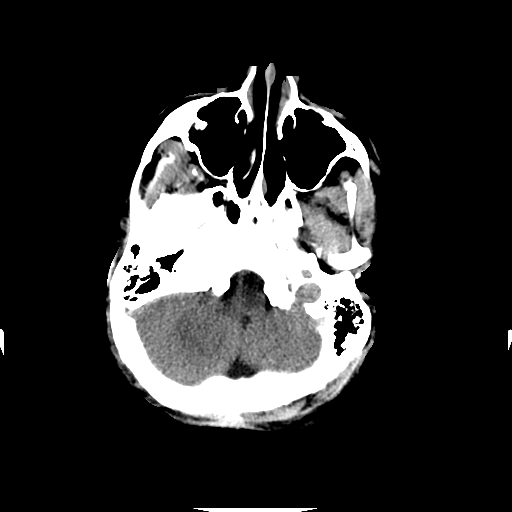
[im 7/32  brain]
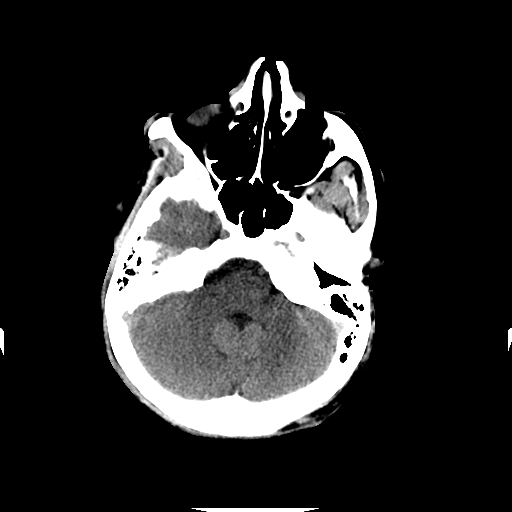
[im 9/32  brain]
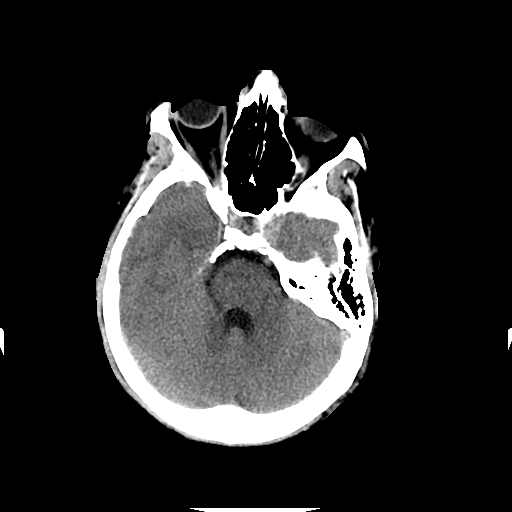
[im 12/32  brain]
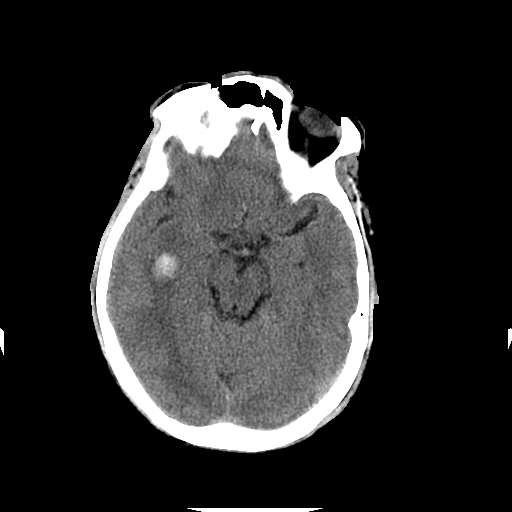
[im 12/32  bone]
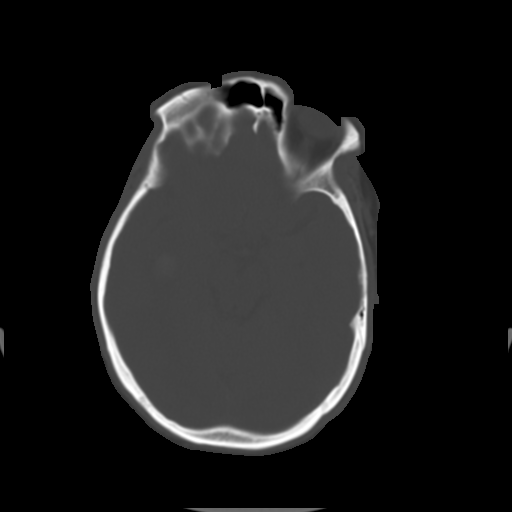
[im 14/32  brain]
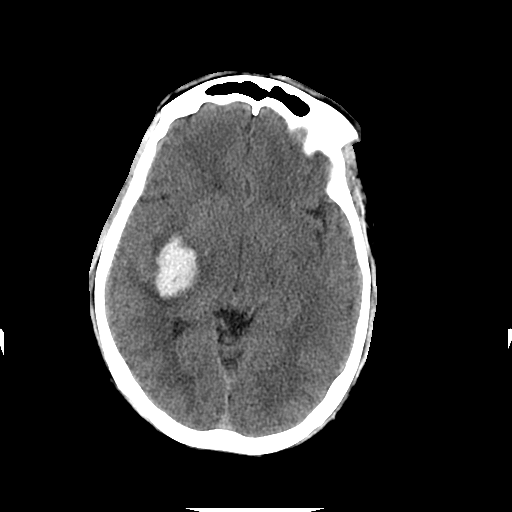
[im 16/32  brain]
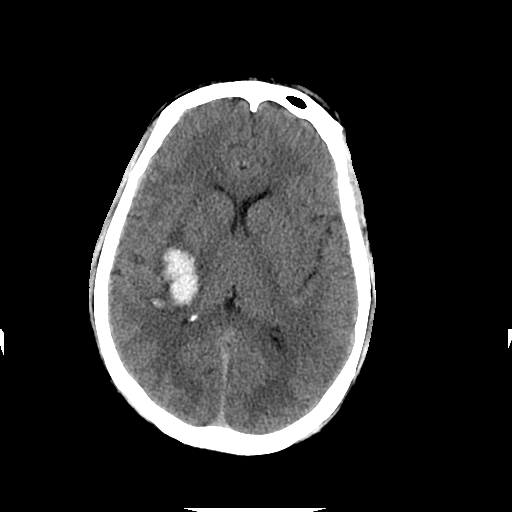
[im 18/32  brain]
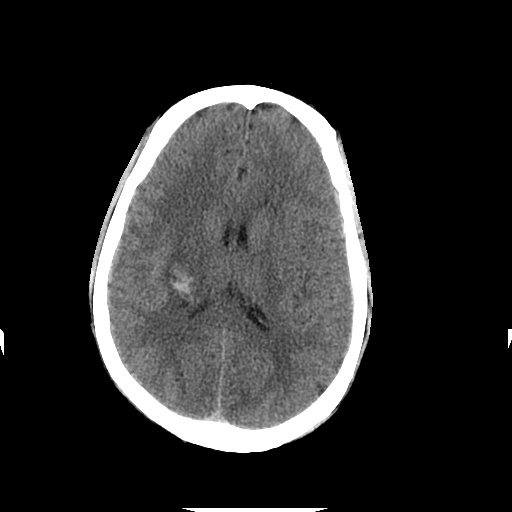
[im 20/32  brain]
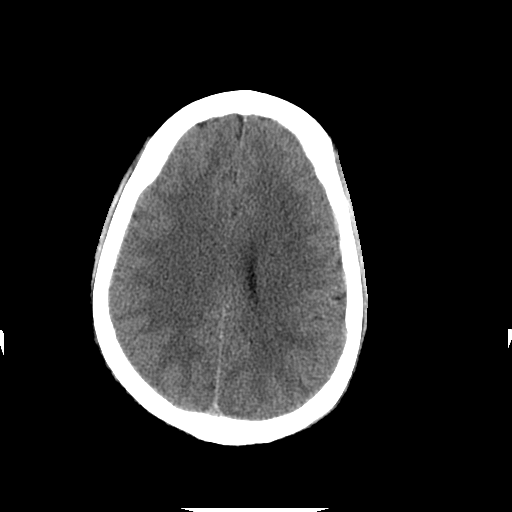
[im 20/32  bone]
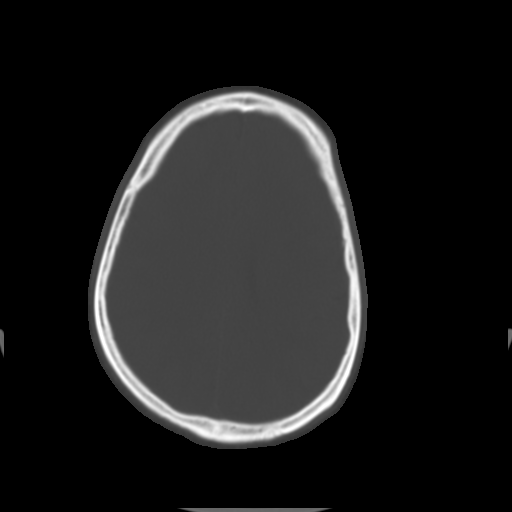
[im 23/32  brain]
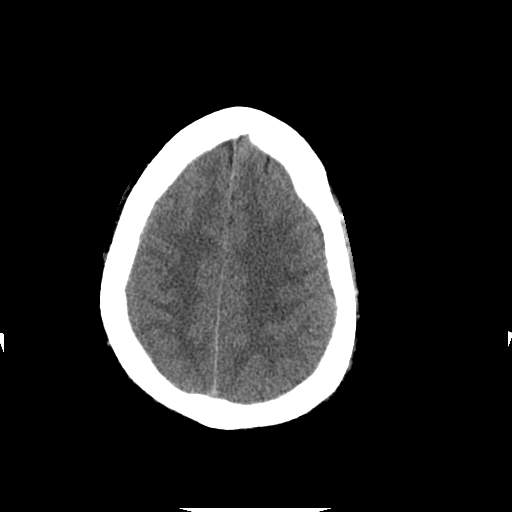
[im 25/32  brain]
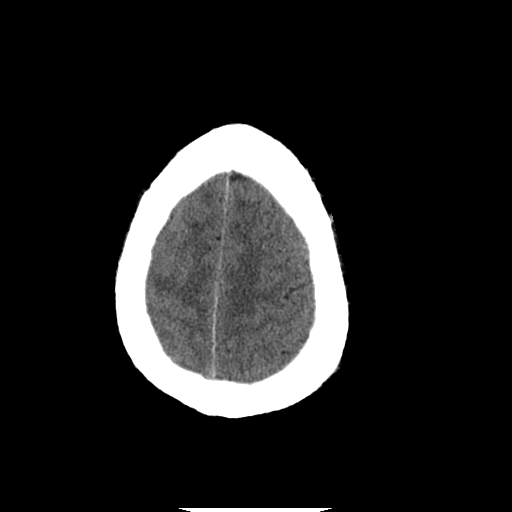
[im 27/32  brain]
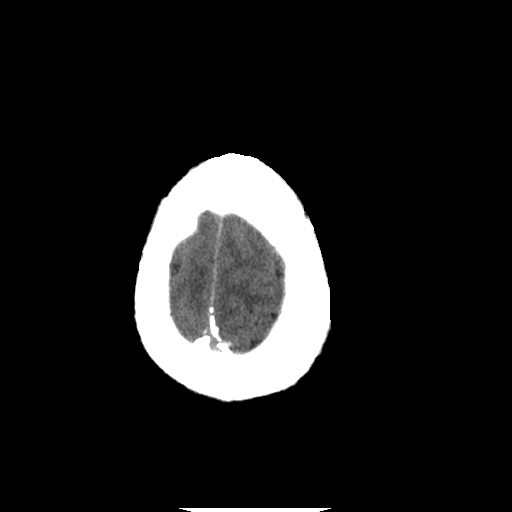
[im 29/32  brain]
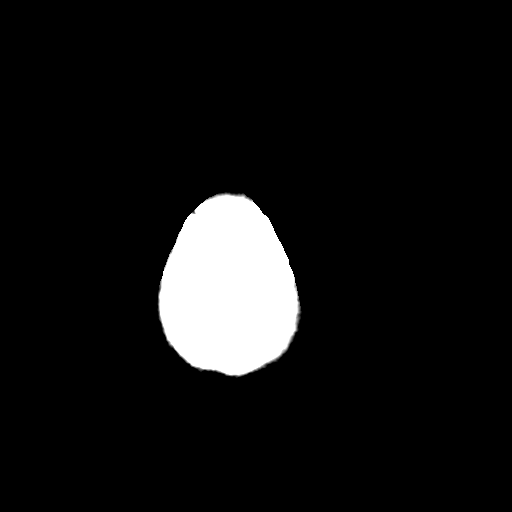
[im 29/32  bone]
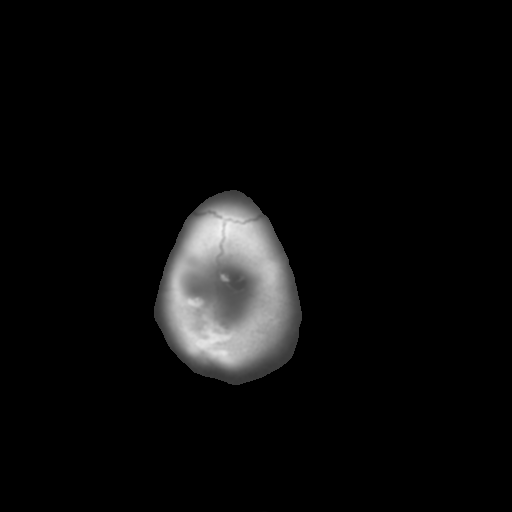

[Series 3: head w/o bone · axial · non-contrast · 0.49mm/px · z∈[+1207,+1227]mm · 2 of 32 slices shown]
[im 3/32  bone]
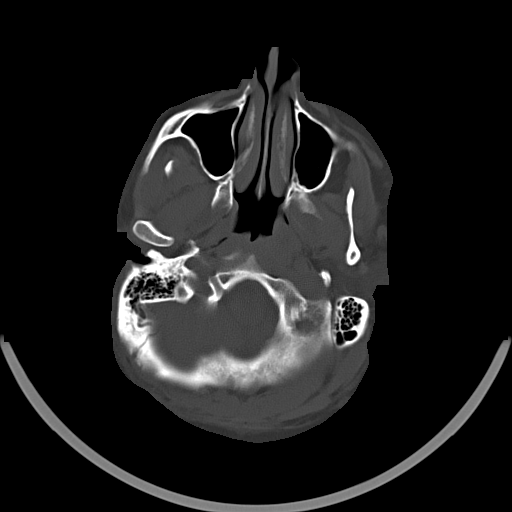
[im 7/32  bone]
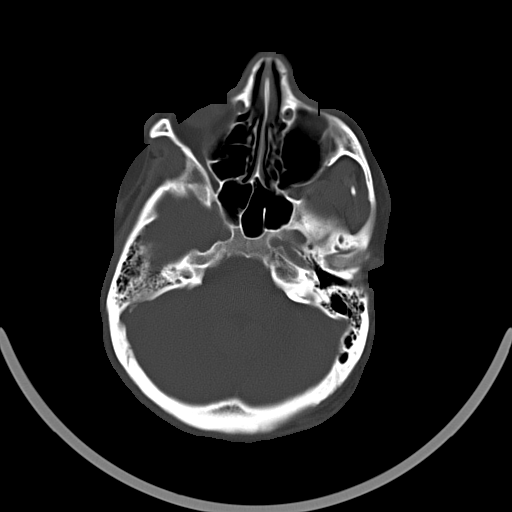

[15 of 30 positions shown; findings below may reference images not displayed]

FINDINGS: A right basal ganglia hemorrhage is similar in size and
configuration to the prior exam. Mild surrounding edema is more
evident than on the prior study, as expected. There is some focal
mass effect. No significant midline shift is present. There is
partial effacement of the atrium of the right lateral ventricle.

No new hemorrhage or mass lesion is present. No acute cortical
infarct is evident. The ventricles are otherwise of normal size. No
significant extra-axial fluid collection is present. No definite
intraventricular blood is present.

The paranasal sinuses and mastoid air cells are clear. The osseous
skull is intact.
IMPRESSION: 1. Stable right basilar ganglia hemorrhage.
2. Partial effacement of the right lateral ventricle without
significant midline shift.

## 2017-03-14 ENCOUNTER — Telehealth: Payer: Self-pay

## 2017-03-14 NOTE — Telephone Encounter (Signed)
SENT NOTES TO SCHEDULING 

## 2018-04-26 ENCOUNTER — Emergency Department (HOSPITAL_COMMUNITY)
Admission: EM | Admit: 2018-04-26 | Discharge: 2018-04-26 | Disposition: A | Discharge status: Home / Self Care | Payer: Self-pay | Attending: Emergency Medicine | Admitting: Emergency Medicine

## 2018-04-26 ENCOUNTER — Emergency Department (HOSPITAL_COMMUNITY): Payer: Self-pay

## 2018-04-26 DIAGNOSIS — N289 Disorder of kidney and ureter, unspecified: Secondary | ICD-10-CM

## 2018-04-26 DIAGNOSIS — R109 Unspecified abdominal pain: Secondary | ICD-10-CM

## 2018-04-26 DIAGNOSIS — Z79899 Other long term (current) drug therapy: Secondary | ICD-10-CM | POA: Insufficient documentation

## 2018-04-26 DIAGNOSIS — I129 Hypertensive chronic kidney disease with stage 1 through stage 4 chronic kidney disease, or unspecified chronic kidney disease: Secondary | ICD-10-CM | POA: Insufficient documentation

## 2018-04-26 DIAGNOSIS — Z8673 Personal history of transient ischemic attack (TIA), and cerebral infarction without residual deficits: Secondary | ICD-10-CM | POA: Insufficient documentation

## 2018-04-26 DIAGNOSIS — R1012 Left upper quadrant pain: Secondary | ICD-10-CM | POA: Insufficient documentation

## 2018-04-26 DIAGNOSIS — N189 Chronic kidney disease, unspecified: Secondary | ICD-10-CM | POA: Insufficient documentation

## 2018-04-26 DIAGNOSIS — E876 Hypokalemia: Secondary | ICD-10-CM | POA: Insufficient documentation

## 2018-04-26 LAB — CBC
HCT: 51.4 % (ref 39.0–52.0)
Hemoglobin: 18.1 g/dL — ABNORMAL HIGH (ref 13.0–17.0)
MCH: 30.9 pg (ref 26.0–34.0)
MCHC: 35.2 g/dL (ref 30.0–36.0)
MCV: 87.9 fL (ref 78.0–100.0)
PLATELETS: 230 10*3/uL (ref 150–400)
RBC: 5.85 MIL/uL — ABNORMAL HIGH (ref 4.22–5.81)
RDW: 12.2 % (ref 11.5–15.5)
WBC: 8.8 10*3/uL (ref 4.0–10.5)

## 2018-04-26 LAB — URINALYSIS, ROUTINE W REFLEX MICROSCOPIC
Bilirubin Urine: NEGATIVE
Glucose, UA: NEGATIVE mg/dL
Ketones, ur: 5 mg/dL — AB
Leukocytes, UA: NEGATIVE
Nitrite: NEGATIVE
PH: 5 (ref 5.0–8.0)
PROTEIN: 100 mg/dL — AB
Specific Gravity, Urine: 1.017 (ref 1.005–1.030)

## 2018-04-26 LAB — BASIC METABOLIC PANEL
Anion gap: 17 — ABNORMAL HIGH (ref 5–15)
BUN: 16 mg/dL (ref 6–20)
CHLORIDE: 102 mmol/L (ref 98–111)
CO2: 24 mmol/L (ref 22–32)
CREATININE: 1.93 mg/dL — AB (ref 0.61–1.24)
Calcium: 9.3 mg/dL (ref 8.9–10.3)
GFR, EST AFRICAN AMERICAN: 51 mL/min — AB (ref >60)
GFR, EST NON AFRICAN AMERICAN: 44 mL/min — AB (ref >60)
Glucose, Bld: 174 mg/dL — ABNORMAL HIGH (ref 70–99)
Potassium: 2.7 mmol/L — CL (ref 3.5–5.1)
SODIUM: 143 mmol/L (ref 135–145)

## 2018-04-26 LAB — MAGNESIUM: MAGNESIUM: 2 mg/dL (ref 1.7–2.4)

## 2018-04-26 MED ORDER — POTASSIUM CHLORIDE CRYS ER 20 MEQ PO TBCR
40.0000 meq | EXTENDED_RELEASE_TABLET | Freq: Once | ORAL | Status: AC
  Administered 2018-04-26: 40 meq via ORAL
  Filled 2018-04-26: qty 2

## 2018-04-26 MED ORDER — POTASSIUM CHLORIDE 10 MEQ/100ML IV SOLN
10.0000 meq | INTRAVENOUS | Status: AC
  Administered 2018-04-26 (×2): 10 meq via INTRAVENOUS
  Filled 2018-04-26 (×2): qty 100

## 2018-04-26 MED ORDER — ONDANSETRON HCL 8 MG PO TABS: 8.0000 mg | ORAL_TABLET | Freq: Three times a day (TID) | ORAL | 0 refills | Status: AC | PRN

## 2018-04-26 MED ORDER — LORAZEPAM 2 MG/ML IJ SOLN
1.0000 mg | Freq: Once | INTRAMUSCULAR | Status: AC
  Administered 2018-04-26: 1 mg via INTRAVENOUS
  Filled 2018-04-26: qty 1

## 2018-04-26 MED ORDER — SODIUM CHLORIDE 0.9 % IV SOLN
INTRAVENOUS | Status: DC
  Administered 2018-04-26: 11:00:00 via INTRAVENOUS

## 2018-04-26 MED ORDER — HYDROCODONE-ACETAMINOPHEN 5-325 MG PO TABS: 2.0000 | ORAL_TABLET | ORAL | 0 refills | Status: AC | PRN

## 2018-04-26 MED ORDER — SODIUM CHLORIDE 0.9 % IV BOLUS
1000.0000 mL | Freq: Once | INTRAVENOUS | Status: AC
  Administered 2018-04-26: 1000 mL via INTRAVENOUS

## 2018-04-26 MED ORDER — HYDROMORPHONE HCL 1 MG/ML IJ SOLN
1.0000 mg | Freq: Once | INTRAMUSCULAR | Status: AC
  Administered 2018-04-26: 1 mg via INTRAVENOUS
  Filled 2018-04-26: qty 1

## 2018-04-26 MED ORDER — POTASSIUM CHLORIDE CRYS ER 20 MEQ PO TBCR: 20.0000 meq | EXTENDED_RELEASE_TABLET | Freq: Every day | ORAL | 0 refills | Status: AC

## 2018-04-26 MED ORDER — MORPHINE SULFATE (PF) 4 MG/ML IV SOLN
4.0000 mg | Freq: Once | INTRAVENOUS | Status: AC
  Administered 2018-04-26: 4 mg via INTRAVENOUS
  Filled 2018-04-26: qty 1

## 2018-04-26 MED ORDER — ONDANSETRON 4 MG PO TBDP
4.0000 mg | ORAL_TABLET | Freq: Once | ORAL | Status: AC
  Administered 2018-04-26: 4 mg via ORAL
  Filled 2018-04-26: qty 1

## 2018-04-26 MED ORDER — ONDANSETRON 8 MG PO TBDP
8.0000 mg | ORAL_TABLET | Freq: Once | ORAL | Status: AC
  Administered 2018-04-26: 8 mg via ORAL
  Filled 2018-04-26: qty 1

## 2018-04-26 NOTE — Discharge Instructions (Addendum)
It was our pleasure to provide your ER care today - we hope that you feel better.  Start with a clear liquid diet then gradually advance to regular foods after 1 or 2 days.  Prescriptions sent to your pharmacy for control of nausea and vomiting, and pain.  Your potassium level is low (2.7) - eat plenty of fruits and vegetables, take potassium supplement as prescribed, and follow up with primary care doctor in the next 2-3 days for recheck. Your kidney function test is elevated (creatinine 1.9) - also have that rechecked by primary care doctor. Avoid taking ibuprofen, motrin, aleve, naprosyn or any anti-inflammatory type pain medication due to the kidney insufficiency.   Return to ER if worse, new symptoms, fevers, new or severe pain, persistent vomiting, other concern.   You were given pain medication in the ER - no driving for the next 4 hours.

## 2018-04-26 NOTE — ED Notes (Signed)
Pt assisted to restroom on steady at this time. Emesis x 1.

## 2018-04-26 NOTE — ED Notes (Signed)
ED Provider at bedside. 

## 2018-04-26 NOTE — ED Notes (Signed)
Unable to obtain urine specimen at this time; MD does not advise in and out catheter at this time.

## 2018-04-26 NOTE — ED Provider Notes (Addendum)
[facility]-EMERGENCY DEPT Provider Note   CSN: [id number] Arrival date & time: 04/26/18  1042     History   Chief Complaint Chief Complaint  Patient presents with  . Abdominal Pain    HPI Paul Reeves is a 32 y.o. male.  Patient c/o acute onset left flank pain/left abd pain, onset this AM at rest. Pain constant, severe, persistent, radiates downwards. Nausea. No vomiting. No abd distension. Having nomal bms. No fever or chills. Denies dysuria or heamturia. No hx kidney stones.   The history is provided by the patient.  Abdominal Pain   Pertinent negatives include fever, vomiting, dysuria and headaches.    Past Medical History:  Diagnosis Date  . Cholelithiasis   . Ejection fraction   . Hypertension 06/26/2013   new dx  . 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 Seasonal allergies    in summer and spring.  . Stroke 06/26/2013   "left me w/weakness on my left side" (08/27/2013)  . Vertigo     Patient Active Problem List   Diagnosis Date Noted  . Choledocholithiasis 05/14/2014  . Abdominal pain 05/14/2014  . Elevated LFTs 05/14/2014  . Aortic dilatation (HCC) 01/03/2014  . Aortic insufficiency 01/03/2014  . Ejection fraction   . Myofascial pain on left side 11/29/2013  . Vertigo   . Cholelithiasis   . Hypertension 06/26/2013  . Stroke (HCC) 06/26/2013  . ICH (intracerebral hemorrhage) (HCC) 06/26/2013    Past Surgical History:  Procedure Laterality Date  . CHOLECYSTECTOMY N/A 05/16/2014   Procedure: LAPAROSCOPIC CHOLECYSTECTOMY WITH INTRAOPERATIVE CHOLANGIOGRAM;  Surgeon: Wilmon Arms. Corliss Skains, MD;  Location: [facility] OR;  Service: General;  Laterality: N/A;  . NO PAST SURGERIES          Home Medications    Prior to Admission medications   Medication Sig Start Date End Date Taking? Authorizing Provider  amLODipine (NORVASC) 10 MG tablet Take 1 tablet (10 mg total) by mouth at bedtime. 07/19/13  Yes Love, Evlyn Kanner, PA-C  famotidine  (PEPCID) 20 MG tablet Take 1 tablet (20 mg total) by mouth 2 (two) times daily. 05/13/14  Yes Neva Seat, Tiffany, PA-C  hydrochlorothiazide (HYDRODIURIL) 25 MG tablet Take 1 tablet (25 mg total) by mouth daily. 07/19/13  Yes Love, Evlyn Kanner, PA-C  HYDROcodone-acetaminophen (NORCO/VICODIN) 5-325 MG per tablet Take 1-2 tablets by mouth every 6 (six) hours as needed. 05/17/14  Yes Richarda Overlie, MD  labetalol (NORMODYNE) 300 MG tablet Take 1 tablet (300 mg total) by mouth 2 (two) times daily. 07/19/13  Yes Love, Evlyn Kanner, PA-C  losartan (COZAAR) 50 MG tablet Take 1 tablet (50 mg total) by mouth daily. 10/30/14  Yes Cassell Clement, MD  pantoprazole (PROTONIX) 40 MG tablet Take 1 tablet (40 mg total) by mouth daily. 05/18/14  Yes Richarda Overlie, MD    Family History Family History  Problem Relation Age of Onset  . Hypertension Father   . Diabetes Maternal Grandmother   . Stroke Paternal Grandfather   . Hypertension Unknown        strong family history of difficult HTN    Social History Social History   Tobacco Use  . Smoking status: Never Smoker  . Smokeless tobacco: Never Used  Substance Use Topics  . Alcohol use: Yes    Comment: 08/27/2013 "glass of wine very rarely"  . Drug use: No     Allergies   Peanuts [peanut oil] and Naproxen   Review of Systems Review of Systems  Constitutional: Negative for  fever.  HENT: Negative for sore throat.   Eyes: Negative for redness.  Respiratory: Negative for shortness of breath.   Cardiovascular: Negative for chest pain.  Gastrointestinal: Positive for abdominal pain. Negative for vomiting.  Genitourinary: Positive for flank pain. Negative for dysuria, scrotal swelling and testicular pain.  Musculoskeletal: Negative for back pain and neck pain.  Skin: Negative for rash.  Neurological: Negative for headaches.  Hematological: Does not bruise/bleed easily.  Psychiatric/Behavioral: Negative for confusion.     Physical Exam Updated Vital  Signs BP (!) 162/68 (BP Location: Left Arm)   Pulse 60   Temp 97.8 F (36.6 C) (Oral)   Resp 19   SpO2 98%   Physical Exam  Constitutional: He appears well-developed and well-nourished.  HENT:  Mouth/Throat: Oropharynx is clear and moist.  Eyes: Conjunctivae are normal.  Neck: Neck supple. No tracheal deviation present.  Cardiovascular: Normal rate, regular rhythm, normal heart sounds and intact distal pulses. Exam reveals no gallop and no friction rub.  No murmur heard. Pulmonary/Chest: Effort normal and breath sounds normal. No accessory muscle usage. No respiratory distress.  Abdominal: Soft. Bowel sounds are normal. He exhibits no distension and no mass. There is no tenderness. There is no rebound and no guarding. No hernia.  Genitourinary:  Genitourinary Comments: No cva tenderness  Musculoskeletal: He exhibits no edema.  TLS spine non tender aligned.   Neurological: He is alert.  Skin: Skin is warm and dry.  Psychiatric:  Anxious appearing.   Nursing note and vitals reviewed.    ED Treatments / Results  Labs (all labs ordered are listed, but only abnormal results are displayed) Results for orders placed or performed during the hospital encounter of 04/26/18  CBC  Result Value Ref Range   WBC 8.8 4.0 - 10.5 K/uL   RBC 5.85 (H) 4.22 - 5.81 MIL/uL   Hemoglobin 18.1 (H) 13.0 - 17.0 g/dL   HCT 16.1 09.6 - 04.5 %   MCV 87.9 78.0 - 100.0 fL   MCH 30.9 26.0 - 34.0 pg   MCHC 35.2 30.0 - 36.0 g/dL   RDW 40.9 81.1 - 91.4 %   Platelets 230 150 - 400 K/uL  Basic metabolic panel  Result Value Ref Range   Sodium 143 135 - 145 mmol/L   Potassium 2.7 (LL) 3.5 - 5.1 mmol/L   Chloride 102 98 - 111 mmol/L   CO2 24 22 - 32 mmol/L   Glucose, Bld 174 (H) 70 - 99 mg/dL   BUN 16 6 - 20 mg/dL   Creatinine, Ser 7.82 (H) 0.61 - 1.24 mg/dL   Calcium 9.3 8.9 - 95.6 mg/dL   GFR calc non Af Amer 44 (L) >60 mL/min   GFR calc Af Amer 51 (L) >60 mL/min   Anion gap 17 (H) 5 - 15   Magnesium  Result Value Ref Range   Magnesium 2.0 1.7 - 2.4 mg/dL   Ct Renal Stone Study  Result Date: 04/26/2018 CLINICAL DATA:  Acute left lower quadrant abdominal pain. EXAM: CT ABDOMEN AND PELVIS WITHOUT CONTRAST TECHNIQUE: Multidetector CT imaging of the abdomen and pelvis was performed following the standard protocol without IV contrast. COMPARISON:  CT scan of May 13, 2014. FINDINGS: Lower chest: No acute abnormality. Hepatobiliary: No focal liver abnormality is seen. Status post cholecystectomy. No biliary dilatation. Pancreas: Unremarkable. No pancreatic ductal dilatation or surrounding inflammatory changes. Spleen: Normal in size without focal abnormality. Adrenals/Urinary Tract: Adrenal glands are unremarkable. Kidneys are normal, without renal calculi, focal lesion,  or hydronephrosis. Bladder is unremarkable. Stomach/Bowel: Stomach is within normal limits. Appendix appears normal. No evidence of bowel wall thickening, distention, or inflammatory changes. Vascular/Lymphatic: No significant vascular findings are present. No enlarged abdominal or pelvic lymph nodes. Reproductive: Prostate is unremarkable. Other: No abdominal wall hernia or abnormality. No abdominopelvic ascites. Musculoskeletal: No acute or significant osseous findings. IMPRESSION: No definite abnormality seen in the abdomen or pelvis. Electronically Signed   By: Lupita RaiderJames  Green Jr, M.D.   On: 04/26/2018 12:33    EKG None  Radiology No results found.  Procedures Procedures (including critical care time)  Medications Ordered in ED Medications  0.9 %  sodium chloride infusion ( Intravenous New Bag/Given 04/26/18 1116)  HYDROmorphone (DILAUDID) injection 1 mg (1 mg Intravenous Given 04/26/18 1116)     Initial Impression / Assessment and Plan / ED Course  I have reviewed the triage vital signs and the nursing notes.  Pertinent labs & imaging results that were available during my care of the patient were reviewed by me  and considered in my medical decision making (see chart for details).  Iv ns bolus. Labs sent. Monitor. CT.   Dilaudid 1 mg iv for pain.  Additional ns bolus.  Labs reviewed k low 2.7, cr elevated. kcl 20 meq iv over 2 hours and kcl po. Mg normal. Additional ns iv.   Recheck abd soft nt. Await ct.   Ct reviewed - no acute process.   Pt more comfortable, less anxious appearing. Po fluids/snack. Ambulate. Spine non tender, aligned. abd soft nt. Await ua.   1456 await ua result, additional iv ns. - signed out to Dr Effie ShyWentz to check UA, disposition appropriately.      Final Clinical Impressions(s) / ED Diagnoses   Final diagnoses:  None    ED Discharge Orders    None           Cathren LaineSteinl, Faylinn Schwenn, MD 04/26/18 1605

## 2018-04-26 NOTE — ED Notes (Signed)
Patient incontinent of stool at this time. Bed linen, and patient changed at this time. Patient also actively vomiting at this time, provider aware. New orders placed for Zofran ODT.

## 2018-04-26 NOTE — ED Notes (Signed)
Bed: [id number] Expected date:  Expected time:  Means of arrival:  Comments: 32 yo m abd pain, headache

## 2018-04-26 NOTE — ED Triage Notes (Signed)
Transported by [facility] from home-- sudden onset of LLQ pain that started 30 min ago. + bilateral upper extremity tingling, left leg numbness/pain. VSS with EMS. Pain 6/10.

## 2018-04-26 NOTE — ED Notes (Signed)
URINE REQUEST MADE 

## 2018-04-26 NOTE — ED Notes (Signed)
Date and time results received: 04/26/18 12:06 PM    Test: Potassium Critical Value: 2.7  Name of Provider Notified: Dr Denton LankSteinl   Orders Received? Or Actions Taken?: Orders Received - See Orders for details

## 2018-04-26 NOTE — ED Provider Notes (Signed)
7:25 PM-patient was initially seen by Dr. Piedad ClimesStiles, requested that I evaluate the patient and consider discharge after completion of IV fluids and obtaining urinalysis. Patient completed 2 L saline bolus, and then began vomiting.  He was treated with oral Zofran.  Clinical Course as of Apr 26 2200  Thu Apr 26, 2018  1930 Normal except small amount of hemoglobin, elevated ketones, elevated protein, and rare bacteria.  Urinalysis, Routine w reflex microscopic(!) [EW]  1943 Urinalysis, Routine w reflex microscopic(!) [EW]  1943 Normal finding  Magnesium [EW]  1944 Normal except hypokalemia, hyperglycemia, renal insufficiency  Basic metabolic panel(!!) [EW]  1944 Normal except hemoglobin elevated consistent with volume depletion  CBC(!) [EW]  2116 She now feels better after the additional treatment which has been initiated and would like to try something to drink.   [EW]  2200 At this time the patient is sleeping, arouses easily, and I am able to encourage him to drink about his water which he did without vomiting.  His mother has gone home for a bit but is coming back by his report.   [EW]    Clinical Course User Index [EW] Mancel BaleWentz, Avani Sensabaugh, MD    Patient Vitals for the past 24 hrs:  BP Temp Temp src Pulse Resp SpO2  04/26/18 1900 (!) 183/50 - - 71 18 100 %  04/26/18 1845 (!) 167/50 - - 68 - 100 %  04/26/18 1830 (!) 173/45 - - 72 - (!) 82 %  04/26/18 1800 (!) 163/54 - - 66 16 99 %  04/26/18 1745 (!) 170/52 - - 66 - 98 %  04/26/18 1659 108/67 - - 71 16 100 %  04/26/18 1545 (!) 172/45 - - 65 17 100 %  04/26/18 1445 (!) 137/48 - - 69 16 97 %  04/26/18 1430 (!) 158/40 - - 66 - 97 %  04/26/18 1415 (!) 166/40 - - 71 - 100 %  04/26/18 1400 (!) 154/39 - - 66 - 100 %  04/26/18 1345 (!) 167/42 - - 67 - (!) 79 %  04/26/18 1245 (!) 168/52 - - (!) 57 16 100 %  04/26/18 1200 (!) 162/42 - - 60 18 100 %  04/26/18 1050 (!) 162/68 97.8 F (36.6 C) Oral 60 19 98 %    7:31 PM Reevaluation with update  and discussion. After initial assessment and treatment, an updated evaluation reveals patient's mother is here now and reports that he remains uncomfortable.  Patient states that he vomited about 20 minutes ago multiple times.  He requests additional treatment for his abdominal pain which is present and now severe.  Ordered additional IV fluids, morphine, and Ativan as an antiemetic. Mancel BaleElliott Elazar Argabright   Medical Decision Making: The patient presented for evaluation of abdominal pain.  He has been treated with narcotic analgesia, laboratory tests obtained and have been reviewed.  A CT scan was done to evaluate for kidney stones.  There was no acute findings seen on the abdomen pelvis CT per the radiologist.  Laboratory testing indicates incidental hypokalemia.  His evaluation is consistent with nonspecific nausea vomiting and diarrhea.  He is improved with treatment and stable for discharge.  CRITICAL CARE- yes Performed by: Mancel BaleElliott Syria Kestner   .  Marland Kitchen.Critical Care Performed by: Mancel BaleWentz, Andretta Ergle, MD Authorized by: Mancel BaleWentz, Trinten Boudoin, MD   Critical care provider statement:    Critical care time (minutes):  35   Critical care start time:  04/26/2018 7:20 PM   Critical care end time:  04/26/2018 10:03 PM  Critical care time was exclusive of:  Separately billable procedures and treating other patients   Critical care was necessary to treat or prevent imminent or life-threatening deterioration of the following conditions:  Metabolic crisis   Critical care was time spent personally by me on the following activities:  Blood draw for specimens, development of treatment plan with patient or surrogate, discussions with consultants, evaluation of patient's response to treatment, examination of patient, obtaining history from patient or surrogate, ordering and performing treatments and interventions, ordering and review of laboratory studies, pulse oximetry, re-evaluation of patient's condition, review of old charts and ordering  and review of radiographic studies  Nursing Notes Reviewed/ Care Coordinated Applicable Imaging Reviewed Interpretation of Laboratory Data incorporated into ED treatment  The patient appears reasonably screened and/or stabilized for discharge and I doubt any other medical condition or other [facility] requiring further screening, evaluation, or treatment in the ED at this time prior to discharge.  Plan: Home Medications-symptomatic treatment with Zofran, and narcotic analgesia.  Also prescription for potassium sent to his pharmacy OTC analgesia; Home Treatments-gradually advance diet; return here if the recommended treatment, does not improve the symptoms; Recommended follow up-PCP of choice.    Mancel Bale, MD 04/26/18 [phone]

## 2018-04-26 NOTE — ED Notes (Signed)
Patient transported to CT 

## 2018-05-10 NOTE — ED Provider Notes (Signed)
Received a call from the pharmacy is that the patient had not picked up their electronic prescription for narcotic analgesia in 2 weeks.  I authorized them to discontinue it.   Paul Reeves, Barbara CowerJason, MD 05/10/18 [phone]

## 2018-05-27 DEATH — deceased
